# Patient Record
Sex: Female | Born: 1935 | Race: White | Hispanic: No | Smoking: Former smoker
Health system: Southern US, Community
[De-identification: ages and names within clinical notes are randomized; demographics above are authoritative.]

## PROBLEM LIST (undated history)

## (undated) DIAGNOSIS — I499 Cardiac arrhythmia, unspecified: Secondary | ICD-10-CM

## (undated) DIAGNOSIS — M545 Low back pain, unspecified: Secondary | ICD-10-CM

## (undated) DIAGNOSIS — Z860100 Personal history of colon polyps, unspecified: Secondary | ICD-10-CM

## (undated) DIAGNOSIS — M1990 Unspecified osteoarthritis, unspecified site: Secondary | ICD-10-CM

## (undated) DIAGNOSIS — E7800 Pure hypercholesterolemia, unspecified: Secondary | ICD-10-CM

## (undated) DIAGNOSIS — M25473 Effusion, unspecified ankle: Secondary | ICD-10-CM

## (undated) DIAGNOSIS — M542 Cervicalgia: Secondary | ICD-10-CM

## (undated) DIAGNOSIS — Z8719 Personal history of other diseases of the digestive system: Secondary | ICD-10-CM

## (undated) DIAGNOSIS — H269 Unspecified cataract: Secondary | ICD-10-CM

## (undated) HISTORY — DX: Pure hypercholesterolemia, unspecified: E78.00

## (undated) HISTORY — DX: Unspecified osteoarthritis, unspecified site: M19.90

## (undated) HISTORY — PX: COLONOSCOPY: SHX174

## (undated) HISTORY — DX: Personal history of colonic polyps: Z86.010

## (undated) HISTORY — PX: TUBAL LIGATION: SHX77

## (undated) HISTORY — DX: Cervicalgia: M54.2

## (undated) HISTORY — DX: Unspecified cataract: H26.9

## (undated) HISTORY — PX: OTHER SURGICAL HISTORY: SHX169

## (undated) HISTORY — PX: POLYPECTOMY: SHX149

## (undated) HISTORY — DX: Low back pain, unspecified: M54.50

## (undated) HISTORY — DX: Personal history of colon polyps, unspecified: Z86.0100

## (undated) HISTORY — DX: Low back pain: M54.5

---

## 1983-11-12 HISTORY — PX: LUMBAR LAMINECTOMY: SHX95

## 1995-07-13 HISTORY — PX: OTHER SURGICAL HISTORY: SHX169

## 1998-10-11 ENCOUNTER — Other Ambulatory Visit: Admission: RE | Admit: 1998-10-11 | Discharge: 1998-10-11 | Payer: Self-pay | Admitting: Obstetrics & Gynecology

## 1999-10-24 ENCOUNTER — Other Ambulatory Visit: Admission: RE | Admit: 1999-10-24 | Discharge: 1999-10-24 | Payer: Self-pay | Admitting: Obstetrics & Gynecology

## 2003-01-07 ENCOUNTER — Other Ambulatory Visit: Admission: RE | Admit: 2003-01-07 | Discharge: 2003-01-07 | Payer: Self-pay | Admitting: Obstetrics & Gynecology

## 2003-12-13 HISTORY — PX: ABDOMINAL HYSTERECTOMY: SHX81

## 2003-12-19 ENCOUNTER — Encounter (INDEPENDENT_AMBULATORY_CARE_PROVIDER_SITE_OTHER): Payer: Self-pay | Admitting: Specialist

## 2003-12-19 ENCOUNTER — Inpatient Hospital Stay (HOSPITAL_COMMUNITY): Admission: RE | Admit: 2003-12-19 | Discharge: 2003-12-22 | Payer: Self-pay | Admitting: Obstetrics and Gynecology

## 2004-11-21 ENCOUNTER — Ambulatory Visit: Payer: Self-pay | Admitting: Pulmonary Disease

## 2004-12-05 ENCOUNTER — Ambulatory Visit: Payer: Self-pay | Admitting: Pulmonary Disease

## 2005-03-12 ENCOUNTER — Other Ambulatory Visit: Admission: RE | Admit: 2005-03-12 | Discharge: 2005-03-12 | Payer: Self-pay | Admitting: Obstetrics & Gynecology

## 2005-11-25 ENCOUNTER — Ambulatory Visit: Payer: Self-pay | Admitting: Pulmonary Disease

## 2005-12-09 ENCOUNTER — Ambulatory Visit: Payer: Self-pay | Admitting: Pulmonary Disease

## 2006-05-13 ENCOUNTER — Encounter: Admission: RE | Admit: 2006-05-13 | Discharge: 2006-05-13 | Payer: Self-pay | Admitting: Obstetrics & Gynecology

## 2007-01-07 ENCOUNTER — Ambulatory Visit: Payer: Self-pay | Admitting: Pulmonary Disease

## 2007-01-07 LAB — CONVERTED CEMR LAB
ALT: 18 units/L (ref 0–40)
AST: 22 units/L (ref 0–37)
Albumin: 3.6 g/dL (ref 3.5–5.2)
Alkaline Phosphatase: 62 units/L (ref 39–117)
BUN: 13 mg/dL (ref 6–23)
Basophils Absolute: 0.1 10*3/uL (ref 0.0–0.1)
Basophils Relative: 1 % (ref 0.0–1.0)
Bilirubin Urine: NEGATIVE
Bilirubin, Direct: 0.1 mg/dL (ref 0.0–0.3)
CO2: 29 meq/L (ref 19–32)
Calcium: 9.1 mg/dL (ref 8.4–10.5)
Chloride: 112 meq/L (ref 96–112)
Cholesterol: 190 mg/dL (ref 0–200)
Creatinine, Ser: 0.7 mg/dL (ref 0.4–1.2)
Eosinophils Absolute: 0.1 10*3/uL (ref 0.0–0.6)
Eosinophils Relative: 1.7 % (ref 0.0–5.0)
GFR calc Af Amer: 106 mL/min
GFR calc non Af Amer: 87 mL/min
Glucose, Bld: 95 mg/dL (ref 70–99)
HCT: 38.1 % (ref 36.0–46.0)
HDL: 48.3 mg/dL (ref >39.0)
Hemoglobin, Urine: NEGATIVE
Hemoglobin: 13.3 g/dL (ref 12.0–15.0)
LDL Cholesterol: 126 mg/dL — ABNORMAL HIGH (ref 0–99)
Leukocytes, UA: NEGATIVE
Lymphocytes Relative: 23.3 % (ref 12.0–46.0)
MCHC: 34.9 g/dL (ref 30.0–36.0)
MCV: 93.5 fL (ref 78.0–100.0)
Monocytes Absolute: 0.7 10*3/uL (ref 0.2–0.7)
Monocytes Relative: 10.1 % (ref 3.0–11.0)
Neutro Abs: 4.5 10*3/uL (ref 1.4–7.7)
Neutrophils Relative %: 63.9 % (ref 43.0–77.0)
Nitrite: NEGATIVE
Platelets: 260 10*3/uL (ref 150–400)
Potassium: 4 meq/L (ref 3.5–5.1)
RBC: 4.07 M/uL (ref 3.87–5.11)
RDW: 12.4 % (ref 11.5–14.6)
Sodium: 145 meq/L (ref 135–145)
Specific Gravity, Urine: 1.015 (ref 1.000–1.03)
TSH: 2.4 microintl units/mL (ref 0.35–5.50)
Total Bilirubin: 1 mg/dL (ref 0.3–1.2)
Total CHOL/HDL Ratio: 3.9
Total Protein, Urine: NEGATIVE mg/dL
Total Protein: 6.6 g/dL (ref 6.0–8.3)
Triglycerides: 78 mg/dL (ref 0–149)
Urine Glucose: NEGATIVE mg/dL
Urobilinogen, UA: 0.2 (ref 0.0–1.0)
VLDL: 16 mg/dL (ref 0–40)
WBC: 7 10*3/uL (ref 4.5–10.5)
pH: 8 (ref 5.0–8.0)

## 2007-01-19 ENCOUNTER — Ambulatory Visit: Payer: Self-pay | Admitting: Gastroenterology

## 2007-02-02 ENCOUNTER — Ambulatory Visit: Payer: Self-pay | Admitting: Gastroenterology

## 2007-02-10 ENCOUNTER — Ambulatory Visit: Payer: Self-pay | Admitting: Gastroenterology

## 2007-02-10 LAB — CONVERTED CEMR LAB
Basophils Absolute: 0.1 10*3/uL (ref 0.0–0.1)
Basophils Relative: 0.8 % (ref 0.0–1.0)
Eosinophils Absolute: 0.2 10*3/uL (ref 0.0–0.6)
Eosinophils Relative: 1.9 % (ref 0.0–5.0)
HCT: 31.3 % — ABNORMAL LOW (ref 36.0–46.0)
Hemoglobin: 10.8 g/dL — ABNORMAL LOW (ref 12.0–15.0)
Lymphocytes Relative: 19.2 % (ref 12.0–46.0)
MCHC: 34.5 g/dL (ref 30.0–36.0)
MCV: 93.5 fL (ref 78.0–100.0)
Monocytes Absolute: 1 10*3/uL — ABNORMAL HIGH (ref 0.2–0.7)
Monocytes Relative: 12.5 % — ABNORMAL HIGH (ref 3.0–11.0)
Neutro Abs: 5.3 10*3/uL (ref 1.4–7.7)
Neutrophils Relative %: 65.6 % (ref 43.0–77.0)
Platelets: 304 10*3/uL (ref 150–400)
RBC: 3.35 M/uL — ABNORMAL LOW (ref 3.87–5.11)
RDW: 11.8 % (ref 11.5–14.6)
Sed Rate: 28 mm/hr — ABNORMAL HIGH (ref 0–25)
WBC: 8.2 10*3/uL (ref 4.5–10.5)

## 2007-05-18 ENCOUNTER — Encounter: Admission: RE | Admit: 2007-05-18 | Discharge: 2007-05-18 | Payer: Self-pay | Admitting: Obstetrics & Gynecology

## 2008-02-29 DIAGNOSIS — M810 Age-related osteoporosis without current pathological fracture: Secondary | ICD-10-CM | POA: Insufficient documentation

## 2008-02-29 DIAGNOSIS — D126 Benign neoplasm of colon, unspecified: Secondary | ICD-10-CM | POA: Insufficient documentation

## 2008-02-29 DIAGNOSIS — E7800 Pure hypercholesterolemia, unspecified: Secondary | ICD-10-CM | POA: Insufficient documentation

## 2008-03-01 ENCOUNTER — Ambulatory Visit: Payer: Self-pay | Admitting: Pulmonary Disease

## 2008-03-01 DIAGNOSIS — M545 Low back pain, unspecified: Secondary | ICD-10-CM | POA: Insufficient documentation

## 2008-03-01 DIAGNOSIS — M542 Cervicalgia: Secondary | ICD-10-CM | POA: Insufficient documentation

## 2008-03-09 ENCOUNTER — Telehealth: Payer: Self-pay | Admitting: Pulmonary Disease

## 2009-03-03 ENCOUNTER — Ambulatory Visit: Payer: Self-pay | Admitting: Pulmonary Disease

## 2009-03-05 LAB — CONVERTED CEMR LAB
ALT: 19 units/L (ref 0–35)
AST: 25 units/L (ref 0–37)
Albumin: 4.2 g/dL (ref 3.5–5.2)
Alkaline Phosphatase: 98 units/L (ref 39–117)
BUN: 16 mg/dL (ref 6–23)
Basophils Absolute: 0 10*3/uL (ref 0.0–0.1)
Basophils Relative: 0.5 % (ref 0.0–3.0)
Bilirubin, Direct: 0.1 mg/dL (ref 0.0–0.3)
CO2: 31 meq/L (ref 19–32)
Calcium: 11.1 mg/dL — ABNORMAL HIGH (ref 8.4–10.5)
Chloride: 110 meq/L (ref 96–112)
Cholesterol: 223 mg/dL — ABNORMAL HIGH (ref 0–200)
Creatinine, Ser: 0.8 mg/dL (ref 0.4–1.2)
Direct LDL: 137.6 mg/dL
Eosinophils Absolute: 0.1 10*3/uL (ref 0.0–0.7)
Eosinophils Relative: 2.1 % (ref 0.0–5.0)
GFR calc non Af Amer: 74.48 mL/min (ref >60)
Glucose, Bld: 97 mg/dL (ref 70–99)
HCT: 37.7 % (ref 36.0–46.0)
HDL: 57.2 mg/dL (ref >39.00)
Hemoglobin: 13.1 g/dL (ref 12.0–15.0)
Lymphocytes Relative: 26.9 % (ref 12.0–46.0)
Lymphs Abs: 1.6 10*3/uL (ref 0.7–4.0)
MCHC: 34.9 g/dL (ref 30.0–36.0)
MCV: 93.5 fL (ref 78.0–100.0)
Monocytes Absolute: 0.6 10*3/uL (ref 0.1–1.0)
Monocytes Relative: 10.1 % (ref 3.0–12.0)
Neutro Abs: 3.8 10*3/uL (ref 1.4–7.7)
Neutrophils Relative %: 60.4 % (ref 43.0–77.0)
Platelets: 228 10*3/uL (ref 150.0–400.0)
Potassium: 4.2 meq/L (ref 3.5–5.1)
RBC: 4.03 M/uL (ref 3.87–5.11)
RDW: 12.1 % (ref 11.5–14.6)
Sodium: 147 meq/L — ABNORMAL HIGH (ref 135–145)
TSH: 2.8 microintl units/mL (ref 0.35–5.50)
Total Bilirubin: 1 mg/dL (ref 0.3–1.2)
Total CHOL/HDL Ratio: 4
Total Protein: 7.4 g/dL (ref 6.0–8.3)
Triglycerides: 95 mg/dL (ref 0.0–149.0)
VLDL: 19 mg/dL (ref 0.0–40.0)
WBC: 6.1 10*3/uL (ref 4.5–10.5)

## 2009-07-19 ENCOUNTER — Telehealth: Payer: Self-pay | Admitting: Pulmonary Disease

## 2009-07-25 ENCOUNTER — Ambulatory Visit: Payer: Self-pay | Admitting: Pulmonary Disease

## 2009-07-25 LAB — CONVERTED CEMR LAB: Streptococcus, Group A Screen (Direct): NEGATIVE

## 2010-01-10 ENCOUNTER — Encounter: Payer: Self-pay | Admitting: Pulmonary Disease

## 2010-01-17 ENCOUNTER — Telehealth (INDEPENDENT_AMBULATORY_CARE_PROVIDER_SITE_OTHER): Payer: Self-pay | Admitting: *Deleted

## 2010-02-26 ENCOUNTER — Ambulatory Visit: Payer: Self-pay | Admitting: Pulmonary Disease

## 2010-03-15 ENCOUNTER — Ambulatory Visit: Payer: Self-pay | Admitting: Pulmonary Disease

## 2010-03-18 LAB — CONVERTED CEMR LAB
ALT: 16 units/L (ref 0–35)
AST: 22 units/L (ref 0–37)
Albumin: 3.9 g/dL (ref 3.5–5.2)
Alkaline Phosphatase: 65 units/L (ref 39–117)
BUN: 16 mg/dL (ref 6–23)
Basophils Absolute: 0.1 10*3/uL (ref 0.0–0.1)
Basophils Relative: 1.1 % (ref 0.0–3.0)
Bilirubin, Direct: 0 mg/dL (ref 0.0–0.3)
CO2: 32 meq/L (ref 19–32)
Calcium: 9.4 mg/dL (ref 8.4–10.5)
Chloride: 107 meq/L (ref 96–112)
Cholesterol: 216 mg/dL — ABNORMAL HIGH (ref 0–200)
Creatinine, Ser: 0.8 mg/dL (ref 0.4–1.2)
Direct LDL: 150 mg/dL
Eosinophils Absolute: 0.2 10*3/uL (ref 0.0–0.7)
Eosinophils Relative: 3.6 % (ref 0.0–5.0)
GFR calc non Af Amer: 75.36 mL/min (ref >60)
Glucose, Bld: 91 mg/dL (ref 70–99)
HCT: 39.8 % (ref 36.0–46.0)
HDL: 50.5 mg/dL (ref >39.00)
Hemoglobin: 13.6 g/dL (ref 12.0–15.0)
Lymphocytes Relative: 31.1 % (ref 12.0–46.0)
Lymphs Abs: 1.8 10*3/uL (ref 0.7–4.0)
MCHC: 34.1 g/dL (ref 30.0–36.0)
MCV: 95.5 fL (ref 78.0–100.0)
Monocytes Absolute: 0.8 10*3/uL (ref 0.1–1.0)
Monocytes Relative: 13.4 % — ABNORMAL HIGH (ref 3.0–12.0)
Neutro Abs: 3 10*3/uL (ref 1.4–7.7)
Neutrophils Relative %: 50.8 % (ref 43.0–77.0)
Phosphorus: 3.9 mg/dL (ref 2.3–4.6)
Platelets: 246 10*3/uL (ref 150.0–400.0)
Potassium: 4.6 meq/L (ref 3.5–5.1)
RBC: 4.17 M/uL (ref 3.87–5.11)
RDW: 13.6 % (ref 11.5–14.6)
Sodium: 143 meq/L (ref 135–145)
TSH: 4.63 microintl units/mL (ref 0.35–5.50)
Total Bilirubin: 0.7 mg/dL (ref 0.3–1.2)
Total CHOL/HDL Ratio: 4
Total Protein: 6.7 g/dL (ref 6.0–8.3)
Triglycerides: 76 mg/dL (ref 0.0–149.0)
VLDL: 15.2 mg/dL (ref 0.0–40.0)
WBC: 5.9 10*3/uL (ref 4.5–10.5)

## 2010-08-01 ENCOUNTER — Encounter: Admission: RE | Admit: 2010-08-01 | Discharge: 2010-08-01 | Payer: Self-pay | Admitting: Orthopedic Surgery

## 2010-09-12 ENCOUNTER — Encounter: Payer: Self-pay | Admitting: Pulmonary Disease

## 2010-12-02 ENCOUNTER — Encounter: Payer: Self-pay | Admitting: Obstetrics & Gynecology

## 2010-12-09 LAB — CONVERTED CEMR LAB
ALT: 16 units/L (ref 0–35)
AST: 24 units/L (ref 0–37)
Albumin: 4 g/dL (ref 3.5–5.2)
Alkaline Phosphatase: 114 units/L (ref 39–117)
BUN: 15 mg/dL (ref 6–23)
Basophils Absolute: 0 10*3/uL (ref 0.0–0.1)
Basophils Relative: 0.2 % (ref 0.0–1.0)
Bilirubin, Direct: 0.1 mg/dL (ref 0.0–0.3)
CO2: 32 meq/L (ref 19–32)
Calcium: 11.2 mg/dL — ABNORMAL HIGH (ref 8.4–10.5)
Chloride: 107 meq/L (ref 96–112)
Cholesterol: 234 mg/dL (ref 0–200)
Creatinine, Ser: 0.8 mg/dL (ref 0.4–1.2)
Direct LDL: 173.2 mg/dL
Eosinophils Absolute: 0.2 10*3/uL (ref 0.0–0.7)
Eosinophils Relative: 2.9 % (ref 0.0–5.0)
GFR calc Af Amer: 90 mL/min
GFR calc non Af Amer: 75 mL/min
Glucose, Bld: 104 mg/dL — ABNORMAL HIGH (ref 70–99)
HCT: 36.4 % (ref 36.0–46.0)
HDL: 51.9 mg/dL (ref >39.0)
Hemoglobin: 12.4 g/dL (ref 12.0–15.0)
Lymphocytes Relative: 25.7 % (ref 12.0–46.0)
MCHC: 34 g/dL (ref 30.0–36.0)
MCV: 94.4 fL (ref 78.0–100.0)
Monocytes Absolute: 0.7 10*3/uL (ref 0.1–1.0)
Monocytes Relative: 8.9 % (ref 3.0–12.0)
Neutro Abs: 5 10*3/uL (ref 1.4–7.7)
Neutrophils Relative %: 62.3 % (ref 43.0–77.0)
Platelets: 289 10*3/uL (ref 150–400)
Potassium: 4.2 meq/L (ref 3.5–5.1)
RBC: 3.86 M/uL — ABNORMAL LOW (ref 3.87–5.11)
RDW: 11.8 % (ref 11.5–14.6)
Sodium: 143 meq/L (ref 135–145)
TSH: 3.38 microintl units/mL (ref 0.35–5.50)
Total Bilirubin: 0.9 mg/dL (ref 0.3–1.2)
Total CHOL/HDL Ratio: 4.5
Total Protein: 7.1 g/dL (ref 6.0–8.3)
Triglycerides: 113 mg/dL (ref 0–149)
VLDL: 23 mg/dL (ref 0–40)
WBC: 7.9 10*3/uL (ref 4.5–10.5)

## 2010-12-11 NOTE — Letter (Signed)
Summary: [facility]  [facility]   Imported By: Sherian Rein 10/15/2010 08:25:50  _____________________________________________________________________  External Attachment:    Type:   Image     Comment:   External Document

## 2010-12-11 NOTE — Assessment & Plan Note (Signed)
Summary: cpx/fasting/apc   Chief Complaint:  Yearly ROV....  History of Present Illness: 77 y/o WF here for a follow up visit... she is doing well overall & reports that she started on FORTEO Aug08 per GYN= DrNeal...   Current Problem List:  PHYSICAL EXAMINATION (ICD-V70.0) - GYN= DrNeal for Pap, Mammograms, BMD- she reports he started FORTEO shots 8/08 and planning 42mo therapy... she had a Pneumovax in 2004, and gets yearly Flu shots...   HYPERCHOLESTEROLEMIA (ICD-272.0) - on diet alone, she has refused my rec's for Statin therapy in the past... states she's taking red yeast rice and wants to see how this works for her...  ~  FLP's over the last 67yrs: TChol 168-213, TG 72-113, HDL 44-53, LDL 107-145...  ~  last FLP 2/08 showed TChol 190, TG 78, HDL 48, LDL 126... she wanted to continue diet Rx.  COLONIC POLYPS (ICD-211.3) - prev exams showed divertics, polyps- last 1/04= hyperplastic.  ~  last colonoscopy 3/08 by DrPatterson was normal- no recurrent polyps... f/y planned 56yrs.  NECK PAIN (ICD-723.1) - new symptom= neck pain on & off, she would like neck XRay...  Hx of BACK PAIN, LUMBAR (ICD-724.2) - s/p lumbar laminectomy 1985 by DrGioffre...  OSTEOPOROSIS (ICD-733.00) - as noted, on FORTEO per DrNeal and he follows her BMD's... prev on Evista, still takes ca++/ vits... she was intol to Actonel Rx in the past...      Current Allergies (reviewed today): ! HYDROCODONE ! ACTONEL (RISEDRONATE SODIUM)  Past Medical History:        PHYSICAL EXAMINATION (ICD-V70.0)    HYPERCHOLESTEROLEMIA (ICD-272.0)    COLONIC POLYPS (ICD-211.3)    NECK PAIN (ICD-723.1)    Hx of BACK PAIN, LUMBAR (ICD-724.2)    OSTEOPOROSIS (ICD-733.00)      Past Surgical History:    S/P surgery for removal of rectal polyp by [facility] 9/96    S/P hysterectomy, BSO, A/P repair by [facility] 2/05    S/P lumbar laminectomy by DrGioffre in 1985    S/P cataract surgery     Review of Systems  The patient  denies anorexia, fever, weight loss, weight gain, vision loss, decreased hearing, hoarseness, chest pain, syncope, peripheral edema, prolonged cough, hemoptysis, abdominal pain, melena, hematochezia, severe indigestion/heartburn, hematuria, incontinence, muscle weakness, suspicious skin lesions, transient blindness, difficulty walking, depression, unusual weight change, abnormal bleeding, enlarged lymph nodes, and angioedema.     Vital Signs:  Patient Profile:   77 Years Old Female Weight:      108.50 pounds O2 Sat:      99 % O2 treatment:    Room Air Temp:     97.1 degrees F oral Pulse rate:   56 / minute BP sitting:   110 / 64  (left arm)  Vitals Entered By: Marijo File CMA (March 01, 2008 9:12 AM)                 Physical Exam  WD, WN, 77 y/o WF in NAD... GENERAL:  Alert & oriented; pleasant & cooperative... HEENT:  [place]/AT, EOM-wnl, PERRLA, EACs-clear, TMs-wnl, NOSE-clear, THROAT-clear & wnl. NECK:  Supple w/ full ROM; no JVD; normal carotid impulses w/o bruits; no thyromegaly or nodules palpated; no lymphadenopathy. CHEST:  Clear to P & A; without wheezes/ rales/ or rhonchi. HEART:  Regular Rhythm; without murmurs/ rubs/ or gallops. ABDOMEN:  Soft & nontender; normal bowel sounds; no organomegaly or masses detected. EXT: without deformities or arthritic changes; no varicose veins/ venous insuffic/ or edema. NEURO:  CN's intact; motor  testing normal; sensory testing normal; gait normal & balance OK. DERM:  No lesions noted; no rash etc...       Impression & Recommendations:  Problem # 1:  PHYSICAL EXAMINATION (ICD-V70.0) Good general health... no new concerns, and only c/o some neck discomfort- XRay CSpine per request... Orders: EKG w/ Interpretation (93000) T-2 View CXR, Same Day (71020.5TC) Venipuncture (10272) TLB-Lipid Panel (80061-LIPID) TLB-BMP (Basic Metabolic Panel-BMET) (80048-METABOL) TLB-CBC Platelet - w/Differential (85025-CBCD) TLB-Hepatic/Liver  Function Pnl (80076-HEPATIC) TLB-TSH (Thyroid Stimulating Hormone) (84443-TSH)   Problem # 2:  HYPERCHOLESTEROLEMIA (ICD-272.0) Assessment: Unchanged She has had mildly elevated numbers in the past- doesn't want med Rx... on diet alone... f/u FLP.  Problem # 3:  COLONIC POLYPS (ICD-211.3) Assessment: Unchanged She is up-to-date w/ colonsocopy last year...  Problem # 4:  OSTEOPOROSIS (ICD-733.00) Assessment: Unchanged She is expertly managed by DrNeal and currently taking Forteo, ca++, vits... Her updated medication list for this problem includes:    Forteo 600 Mcg/2.59ml Soln (Teriparatide (recombinant))    Caltrate 600+d Plus 600-400 Mg-unit Chew (Calcium carbonate-vit d-min) .Marland Kitchen... Take 2 tablets by mouth once daily    Vitamin D 400 Unit Tabs (Cholecalciferol) .Marland Kitchen... Take 1 tablet by mouth once a day  Problem # 5:  NECK PAIN (ICD-723.1) Assessment: New We will check CSpine films... Her updated medication list for this problem includes:    Adult Aspirin Low Strength 81 Mg Tbdp (Aspirin) .Marland Kitchen... Take 1 tablet by mouth once a day  Complete Medication List: 1)  Adult Aspirin Low Strength 81 Mg Tbdp (Aspirin) .... Take 1 tablet by mouth once a day 2)  Forteo 600 Mcg/2.10ml Soln (Teriparatide (recombinant)) 3)  Caltrate 600+d Plus 600-400 Mg-unit Chew (Calcium carbonate-vit d-min) .... Take 2 tablets by mouth once daily 4)  Vitamin D 400 Unit Tabs (Cholecalciferol) .... Take 1 tablet by mouth once a day 5)  Multivitamins Tabs (Multiple vitamin) .... Take 1 tablet by mouth once a day   Patient Instructions: 1)  Today we updated your med list--- see below. 2)  We also did a follow up CXR & CSpine films, plus your FASTING blood work... please call the "phone tree" in a few days for your lab results.Marland KitchenMarland Kitchen 3)  Be careful w/ your exercise program and protect your neck... 4)  Call for any problems...    ]

## 2010-12-11 NOTE — Letter (Signed)
Summary: [facility]   Imported By: Lester [place] 01/22/2010 09:22:24  _____________________________________________________________________  External Attachment:    Type:   Image     Comment:   External Document

## 2010-12-11 NOTE — Assessment & Plan Note (Signed)
Summary: cpx///kp   CC:  cpx/fasting today.  History of Present Illness: 77 y/o WF here for a follow up visit... she is doing well overall without new complaints or concerns... she has been under increased stress w/ her husb's bladder cancer (Rx by DrOttelin), and her grandson's recent Dx w/ an anaplastic ependymoma (s/p neurosurg & XRT)... she doesn't like taking meds and refuses statin therapy for her cholesterol... she is also followed by [facility] for GYN and she reports that he has checked her BMD (she remains on Forteo under his direction), and Vit D level (on OTC Vit D supplement)...     Current Problem List:  PHYSICAL EXAMINATION (ICD-V70.0) - on ASA 81mg /d... GYN= DrNeal for Pap, Mammograms, BMD- she reports he started FORTEO shots 8/08 and planning 44mo therapy... she had a Pneumovax in 2004, and gets yearly Flu shots...   HYPERCHOLESTEROLEMIA (ICD-272.0) - on diet alone, she has refused my rec's for Statin therapy in the past... states she's taking red yeast rice and wants to see how this works for her...  ~  FLP's over the last 28yrs: TChol 168-213, TG 72-113, HDL 44-53, LDL 107-145...  ~  FLP 2/08 showed TChol 190, TG 78, HDL 48, LDL 126... she wanted to continue diet Rx.  ~  FLP 4/09 showed TChol 234, TG 113, HDL 52, LDL 173... pt refused Simva rx.  ~  FLP 4/10 showed TChol 223, TG 95, HDL 57, LDL 138... she wants to continue diet alone.  COLONIC POLYPS (ICD-211.3) - prev exams showed divertics, polyps- last 1/04= hyperplastic.  ~  last colonoscopy 3/08 by DrPatterson was normal- no recurrent polyps... f/y planned 50yrs.  NECK PAIN (ICD-723.1) - c/o neck pain on & off... CSpine films 4/09 were WNL.Marland KitchenMarland Kitchen  Hx of BACK PAIN, LUMBAR (ICD-724.2) - s/p lumbar laminectomy 1985 by DrGioffre...  OSTEOPOROSIS (ICD-733.00) - as noted, on FORTEO per DrNeal and he follows her BMD's... prev on Evista, still takes ca++/ vits... she was intol to Actonel Rx in the past...   Allergies: 1)  !  Hydrocodone 2)  ! Actonel (Risedronate Sodium)  Comments:  Nurse/Medical Assistant: The patient's medications and allergies were reviewed with the patient and were updated in the Medication and Allergy Lists.  Past History:  Past Medical History:    HYPERCHOLESTEROLEMIA (ICD-272.0)    COLONIC POLYPS (ICD-211.3)    NECK PAIN (ICD-723.1)    Hx of BACK PAIN, LUMBAR (ICD-724.2)    OSTEOPOROSIS (ICD-733.00)  Past Surgical History:    S/P lumbar laminectomy by DrGioffre in 1985    S/P surgery for removal of rectal polyp by DrTDavis 9/96    S/P hysterectomy, BSO, A/P repair by [facility] 2/05    S/P cataract surgery  Family History:    Reviewed history and no changes required:  Social History:    Reviewed history and no changes required:  Review of Systems      See HPI       The patient complains of back pain.  The patient denies fever, chills, sweats, anorexia, fatigue, weakness, malaise, weight loss, sleep disorder, blurring, diplopia, eye irritation, eye discharge, vision loss, eye pain, photophobia, earache, ear discharge, tinnitus, decreased hearing, nasal congestion, nosebleeds, sore throat, hoarseness, chest pain, palpitations, syncope, dyspnea on exertion, orthopnea, PND, peripheral edema, cough, dyspnea at rest, excessive sputum, hemoptysis, wheezing, pleurisy, nausea, vomiting, diarrhea, constipation, change in bowel habits, abdominal pain, melena, hematochezia, jaundice, gas/bloating, indigestion/heartburn, dysphagia, odynophagia, dysuria, hematuria, urinary frequency, urinary hesitancy, nocturia, incontinence, joint pain, joint swelling, muscle  cramps, muscle weakness, stiffness, arthritis, sciatica, restless legs, leg pain at night, leg pain with exertion, rash, itching, dryness, suspicious lesions, paralysis, paresthesias, seizures, tremors, vertigo, transient blindness, frequent falls, frequent headaches, difficulty walking, depression, anxiety, memory loss, confusion, cold  intolerance, heat intolerance, polydipsia, polyphagia, polyuria, unusual weight change, abnormal bruising, bleeding, enlarged lymph nodes, urticaria, allergic rash, hay fever, and recurrent infections.    Vital Signs:  Patient profile:   76 year old female Height:      60 inches Weight:      107.13 pounds BMI:     21.00 O2 Sat:      96 % Temp:     96.5 degrees F oral Pulse rate:   61 / minute BP sitting:   132 / 60  (right arm) Cuff size:   regular  Vitals Entered By: Marijo File CMA (March 03, 2009 10:01 AM)  O2 Sat at Rest %:  96 O2 Flow:  room air CC: cpx/fasting today Is Patient Diabetic? No Pain Assessment Patient in pain? no      Comments pt knows her meds   Physical Exam  Additional Exam:  WD, WN, 77 y/o WF in NAD... GENERAL:  Alert & oriented; pleasant & cooperative... HEENT:  [place]/AT, EOM-wnl, PERRLA, EACs-clear, TMs-wnl, NOSE-clear, THROAT-clear & wnl. NECK:  Supple w/ fairROM; no JVD; normal carotid impulses w/o bruits; no thyromegaly or nodules palpated; no lymphadenopathy. CHEST:  Clear to P & A; without wheezes/ rales/ or rhonchi. HEART:  Regular Rhythm; without murmurs/ rubs/ or gallops. ABDOMEN:  Soft & nontender; normal bowel sounds; no organomegaly or masses detected. EXT: without deformities or arthritic changes; no varicose veins/ venous insuffic/ or edema. NEURO:  CN's intact; motor testing normal; sensory testing normal; gait normal & balance OK. DERM:  No lesions noted; she is way too tanned & advised to: 1- GET OUT OF THE SUN, 2- eval by Derm for any early lesions.     Impression & Recommendations:  Problem # 1:  HYPERCHOLESTEROLEMIA (ICD-272.0) FLP is not at goal but she is committed to diet Rx and refuses Statin therapy... Orders: Venipuncture (47425) TLB-Lipid Panel (80061-LIPID) TLB-BMP (Basic Metabolic Panel-BMET) (80048-METABOL) TLB-CBC Platelet - w/Differential (85025-CBCD) TLB-Hepatic/Liver Function Pnl (80076-HEPATIC) TLB-TSH  (Thyroid Stimulating Hormone) (84443-TSH)  Problem # 2:  COLONIC POLYPS (ICD-211.3) She is up to date on colonoscopy etc per GI...  Problem # 3:  Hx of BACK PAIN, LUMBAR (ICD-724.2) She has some intermittent neck pain & back pain...  Her updated medication list for this problem includes:    Adult Aspirin Low Strength 81 Mg Tbdp (Aspirin) .Marland Kitchen... Take 1 tablet by mouth once a day  Problem # 4:  OSTEOPOROSIS (ICD-733.00) Followed by Latanya Presser... Her updated medication list for this problem includes:    Forteo 600 Mcg/2.30ml Soln (Teriparatide (recombinant))  Problem # 5:  PHYSICAL EXAMINATION (ICD-V70.0) She has been under increased stress but doesn't want anxiolytic meds...  Complete Medication List: 1)  Adult Aspirin Low Strength 81 Mg Tbdp (Aspirin) .... Take 1 tablet by mouth once a day 2)  Forteo 600 Mcg/2.10ml Soln (Teriparatide (recombinant)) 3)  Caltrate 600+d Plus 600-400 Mg-unit Chew (Calcium carbonate-vit d-min) .... Take 2 tablets by mouth once daily 4)  Vitamin D 400 Unit Tabs (Cholecalciferol) .... Take 1 tablet by mouth once a day 5)  Multivitamins Tabs (Multiple vitamin) .... Take 1 tablet by mouth once a day  Other Orders: EKG w/ Interpretation (93000) T-2 View CXR, Same Day (71020.5TC)  Patient Instructions: 1)  Today  we updated your med list- see below.... 2)  Ema- you look great... keep up the good work (and get out of the sun). 3)  Today we did your follow up CXR & fasting blood work... please call the "phone tree" in a few days for your lab results.Marland KitchenMarland Kitchen 4)  Call for any questions.Marland KitchenMarland Kitchen

## 2010-12-11 NOTE — Assessment & Plan Note (Signed)
Summary: sore throat, white spots on tonsils/apc   CC:  sore throat and white spots on tonsils  and tension/soreness in the back of the neckx1week.  states went to UC last Wed but was told it was food particles.  .  History of Present Illness: 77 yo female with known history of Hyperlipidemia, Osteoporosis  07/25/09--Presents for an acute office visit. Complains of sore throat, white spots on tonsils  and tension/soreness in the back of the neckx1week.  states went to UC last Wed but was told it was food particles. Complians of drainage, mild soreness with drinking. Denies chest pain, dyspnea, orthopnea, hemoptysis, fever, n/v/d, edema, headache.     Medications Prior to Update: 1)  Adult Aspirin Low Strength 81 Mg  Tbdp (Aspirin) .... Take 1 Tablet By Mouth Once A Day 2)  Forteo 600 Mcg/2.66ml  Soln (Teriparatide (Recombinant)) 3)  Caltrate 600+d Plus 600-400 Mg-Unit  Chew (Calcium Carbonate-Vit D-Min) .... Take 2 Tablets By Mouth Once Daily 4)  Vitamin D 400 Unit  Tabs (Cholecalciferol) .... Take 1 Tablet By Mouth Once A Day 5)  Multivitamins   Tabs (Multiple Vitamin) .... Take 1 Tablet By Mouth Once A Day  Allergies: 1)  ! Hydrocodone 2)  ! Actonel (Risedronate Sodium)  Past History:  Past Surgical History: Last updated: 03/03/2009 S/P lumbar laminectomy by DrGioffre in 1985 S/P surgery for removal of rectal polyp by DrTDavis 9/96 S/P hysterectomy, BSO, A/P repair by DrNeal 2/05 S/P cataract surgery  Social History: Last updated: 07/25/2009 Married 7 kids remote smoking x 1 yr- teens wine w/ dinner.   Risk Factors: Smoking Status: quit (02/29/2008)  Past Medical History: PHYSICAL EXAMINATION (ICD-V70.0) - on ASA 81mg /d... GYN= DrNeal for Pap, Mammograms, BMD- she reports he started FORTEO shots 8/08 and planning 23mo therapy... she had a Pneumovax in 2004, and gets yearly Flu shots...   HYPERCHOLESTEROLEMIA (ICD-272.0) - on diet alone, she has refused my rec's for Statin  therapy in the past... states she's taking red yeast rice and wants to see how this works for her...  ~  FLP's over the last 60yrs: TChol 168-213, TG 72-113, HDL 44-53, LDL 107-145...  ~  FLP 2/08 showed TChol 190, TG 78, HDL 48, LDL 126... she wanted to continue diet Rx.  ~  FLP 4/09 showed TChol 234, TG 113, HDL 52, LDL 173... pt refused Simva rx.  ~  FLP 4/10 showed TChol 223, TG 95, HDL 57, LDL 138... she wants to continue diet alone.  COLONIC POLYPS (ICD-211.3) - prev exams showed divertics, polyps- last 1/04= hyperplastic.  ~  last colonoscopy 3/08 by DrPatterson was normal- no recurrent polyps... f/y planned 77yrs.  NECK PAIN (ICD-723.1) - c/o neck pain on & off... CSpine films 4/09 were WNL.Marland KitchenMarland Kitchen  Hx of BACK PAIN, LUMBAR (ICD-724.2) - s/p lumbar laminectomy 1985 by DrGioffre...  OSTEOPOROSIS (ICD-733.00) - as noted, on FORTEO per DrNeal and he follows her BMD's... prev on Evista, still takes ca++/ vits... she was intol to Actonel Rx in the past...    Social History: Married 7 kids remote smoking x 1 yr- teens wine w/ dinner.   Review of Systems      See HPI  Vital Signs:  Patient profile:   77 year old female Height:      60 inches O2 Sat:      99 % on Room air Temp:     97.1 degrees F oral Pulse rate:   95 / minute BP sitting:   134 /  70  (left arm) Cuff size:   regular  Vitals Entered By: Boone Master CNA (July 25, 2009 4:26 PM)  O2 Flow:  Room air  Physical Exam  Additional Exam:  WD, WN, 77 y/o WF in NAD... GENERAL:  Alert & oriented; pleasant & cooperative... HEENT:  [place]/AT, EOM-wnl, PERRLA, EACs-clear, TMs-wnl, NOSE-clear, THROAT-clear drainage, no lesion.  NECK:  Supple w/ fairROM; no JVD; normal carotid impulses w/o bruits; no thyromegaly or nodules palpated; no lymphadenopathy. CHEST:  Clear to P & A; without wheezes/ rales/ or rhonchi. HEART:  Regular Rhythm; without murmurs/ rubs/ or gallops. ABDOMEN:  Soft & nontender; normal bowel sounds; no  organomegaly or masses detected. EXT: without deformities or arthritic changes; no varicose veins/ venous insuffic/ or edema.     Impression & Recommendations:  Problem # 1:  UPPER RESPIRATORY INFECTION (ICD-465.9) strep neg.  Zpack take as directed.  Salt water gargles as needed  Claritin 10mg  at bedtime for 5 days Increase fluids, rest, tylneol as needed  Please contact office for sooner follow up if symptoms do not improve or worsen  Her updated medication list for this problem includes:    Adult Aspirin Low Strength 81 Mg Tbdp (Aspirin) .Marland Kitchen... Take 1 tablet by mouth once a day  Orders: Est. Patient Level III (16109)  Medications Added to Medication List This Visit: 1)  Vitamin D 1000 Unit Tabs (Cholecalciferol) .... Take 1 tablet by mouth once a day 2)  Zithromax Z-pak 250 Mg Tabs (Azithromycin) .... Take as directed.  Complete Medication List: 1)  Adult Aspirin Low Strength 81 Mg Tbdp (Aspirin) .... Take 1 tablet by mouth once a day 2)  Caltrate 600+d Plus 600-400 Mg-unit Chew (Calcium carbonate-vit d-min) .... Take 2 tablets by mouth once daily 3)  Vitamin D 1000 Unit Tabs (Cholecalciferol) .... Take 1 tablet by mouth once a day 4)  Multivitamins Tabs (Multiple vitamin) .... Take 1 tablet by mouth once a day 5)  Zithromax Z-pak 250 Mg Tabs (Azithromycin) .... Take as directed.  Other Orders: TLB-Beta Strep Screen (87880-STRSCR)  Patient Instructions: 1)  Zpack take as directed.  2)  Salt water gargles as needed  3)  Claritin 10mg  at bedtime for 5 days 4)  Increase fluids, rest, tylneol as needed  5)  Please contact office for sooner follow up if symptoms do not improve or worsen  Prescriptions: ZITHROMAX Z-PAK 250 MG TABS (AZITHROMYCIN) take as directed.  #1 x 0   Entered and Authorized by:   Rubye Oaks NP   Signed by:   Rubye Oaks NP on 07/25/2009   Method used:   Electronically to        [facility] [phone]* (retail)       [address]        [place], [place]  [address]       Ph: [phone] or [phone]       Fax: [phone]   RxID:   [id number]

## 2010-12-11 NOTE — Progress Notes (Signed)
Summary: appt  Phone Note Call from Patient Call back at cell# [phone]   Caller: Patient Reason for Call: Talk to Nurse Summary of Call: Dr. Celene Skeen sent a report to Dr. Kriste Basque and told pt she needed to schedule appt w/ him.  Do we have report? Initial call taken by: Eugene Gavia,  January 17, 2010 9:42 AM  Follow-up for Phone Call        Has SN received any notes from Dr. Celene Skeen regarding this patient and vascular issues with her feet? Please advise.Michel Bickers CMA  January 17, 2010 10:02 AM  Additional Follow-up for Phone Call Additional follow up Details #1::        per SN---he did receive the letter from Dr. Celene Skeen----????[facility] ???  this is treatable with medication if the symptoms are severe and other causes are ruled out.   please schedule ov to discuss this----lmomtcb for appt date Randell Loop [facility]  January 17, 2010 5:10 PM   pt returned call to [facility].  Please call her back on her cell phone today. Additional Follow-up by: Eugene Gavia,  January 18, 2010 8:09 AM    Additional Follow-up for Phone Call Additional follow up Details #2::    called and spoke with pt-- she is aware of SN recs and will come in for appt on 4-27 at 3pm---will call pt if we have any cancellations per pts request. Randell Loop CMA  January 19, 2010 9:55 AM

## 2010-12-11 NOTE — Progress Notes (Signed)
Summary: NEED LAB RESULTS  Phone Note Call from Patient   Caller: Patient Call For: Desiraye Rolfson Summary of Call: NEED LAB RESULTS  Initial call taken by: Rickard Patience,  March 09, 2008 2:49 PM  Follow-up for Phone Call        results are in EMR unsigned; please have SN or TP review and we will call pt with results. Thanks. Reynaldo Minium CMA  March 09, 2008 2:51 PM   Additional Follow-up for Phone Call Additional follow up Details #1::        called pt about her lab and xray results...per SN---he rec that she start on simvastatin 40mg  due to her chol. test.  pt requests that a copy of her labs be mailed to her.  she stated that she is taking the red yeast rice and would like to wait at least another month and have her chol rechecked.  she may decide to start on the simvastatin at that time if the numbers are no better. Additional Follow-up by: Marijo File CMA,  March 09, 2008 5:02 PM

## 2010-12-11 NOTE — Assessment & Plan Note (Signed)
Summary: f/u to discuss papers from Dr. Rayfield Citizen   CC:  Yearly ROV & review....  History of Present Illness: 77 y/o WF here for a follow up visit...    ~  Apr10:  she is doing well overall without new complaints or concerns... she has been under increased stress w/ her husb's bladder cancer (Rx by DrOttelin), and her grandson's recent Dx w/ an anaplastic ependymoma (s/p neurosurg & XRT)... she doesn't like taking meds and refuses statin therapy for her cholesterol... she is also followed by [facility] for GYN and she reports that he has checked her BMD (she remains on Forteo under his direction), and Vit D level (on OTC Vit D supplement)...    ~  February 26, 2010:  she is concerned because podiatrist DrTuckman dx "vasospasm" ?Raynaud's in her second toe bilaterally- she notes that it gets cold, turns purple, & "peels" in the winter... there is no pain, no involvement of other toes or fingers, and periph pulses are totally WNL.Marland Kitchen. we discussed this in detail & she is not inclined to try new meds (eg- Procardia)... sshe will f/u w/ Ortho (new foot doc at [facility] office) for their opinion...  otherw she is doing well- notes some neck pain but hasn't tried any meds & doesn't want pain Rx, she will also have this looked at by Ortho DrLucey... she will return for FASTING labs in several week.    Current Problem List:  PHYSICAL EXAMINATION (ICD-V70.0) - on ASA 81mg /d... GYN= DrNeal for Pap, Mammograms, BMD- she reports he started FORTEO shots 8/08 and stopped after 20yrs therapy... she notes that BMD is improved- now on Caltrate alone... she had a Pneumovax in 2004, and gets yearly Flu shots... "I'm not a pill taker" she says...  HYPERCHOLESTEROLEMIA (ICD-272.0) - on diet alone, she has refused my rec's for Statin therapy in the past... states she's taking red yeast rice, Krill oil, and OTC Niacin...  ~  FLP's over the last 77yrs: TChol 168-213, TG 72-113, HDL 44-53, LDL 107-145...  ~  FLP 2/08 showed  TChol 190, TG 78, HDL 48, LDL 126... she wanted to continue diet Rx.  ~  FLP 4/09 showed TChol 234, TG 113, HDL 52, LDL 173... pt refused Simva rx.  ~  FLP 4/10 showed TChol 223, TG 95, HDL 57, LDL 138... she wants to continue diet alone.  ~  FLP 5/11 showed =   COLONIC POLYPS (ICD-211.3) - prev exams showed divertics, polyps- last 1/04= hyperplastic.  ~  last colonoscopy 3/08 by DrPatterson was normal- no recurrent polyps... f/y planned 63yrs.  NECK PAIN (ICD-723.1) - c/o neck pain on & off... CSpine films 4/09 were WNL.Marland Kitchen. she refuses pain meds and will f/u w/ her Orthopedist DrLucey...  Hx of BACK PAIN, LUMBAR (ICD-724.2) - s/p lumbar laminectomy 1985 by DrGioffre...  OSTEOPOROSIS (ICD-733.00) - as noted, took FORTEO x23yrs per DrNeal and he follows her BMD's... prev on Evista, still takes ca++/ vits... she was intol to Actonel Rx in the past...    Allergies: 1)  ! Hydrocodone 2)  ! Actonel (Risedronate Sodium)  Comments:  Nurse/Medical Assistant: The patient's medications and allergies were reviewed with the patient and were updated in the Medication and Allergy Lists.  Past History:  Past Medical History:  HYPERCHOLESTEROLEMIA (ICD-272.0) COLONIC POLYPS (ICD-211.3) NECK PAIN (ICD-723.1) Hx of BACK PAIN, LUMBAR (ICD-724.2) OSTEOPOROSIS (ICD-733.00)  Past Surgical History: S/P lumbar laminectomy by DrGioffre in 1985 S/P surgery for removal of rectal polyp by [facility] 9/96 S/P hysterectomy,  BSO, A/P repair by [facility] 2/05 S/P cataract surgery  Family History: Reviewed history and no changes required.  Social History: Reviewed history from 07/25/2009 and no changes required. Married 7 kids remote smoking x 1 yr- teens wine w/ dinner.   Review of Systems      See HPI  The patient denies anorexia, fever, weight loss, weight gain, vision loss, decreased hearing, hoarseness, chest pain, syncope, dyspnea on exertion, peripheral edema, prolonged cough, headaches,  hemoptysis, abdominal pain, melena, hematochezia, severe indigestion/heartburn, hematuria, incontinence, muscle weakness, suspicious skin lesions, transient blindness, difficulty walking, depression, unusual weight change, abnormal bleeding, enlarged lymph nodes, and angioedema.    Vital Signs:  Patient profile:   77 year old female Height:      60 inches Weight:      112.25 pounds BMI:     22.00 O2 Sat:      98 % on Room air Temp:     97.2 degrees F oral Pulse rate:   84 / minute BP sitting:   124 / 66  (right arm) Cuff size:   regular  Vitals Entered By: Randell Loop CMA (February 26, 2010 1:46 PM)  O2 Sat at Rest %:  98 O2 Flow:  Room air CC: Yearly ROV & review... Is Patient Diabetic? No Pain Assessment Patient in pain? yes      Onset of pain  neck pain Comments meds updated today   Physical Exam  Additional Exam:  WD, WN, 77 y/o WF in NAD... GENERAL:  Alert & oriented; pleasant & cooperative... HEENT:  [place]/AT, EOM-wnl, PERRLA, EACs-clear, TMs-wnl, NOSE-clear, THROAT-clear drainage, no lesion.  NECK:  Supple w/ fairROM; no JVD; normal carotid impulses w/o bruits; no thyromegaly or nodules palpated; no lymphadenopathy. CHEST:  Clear to P & A; without wheezes/ rales/ or rhonchi. HEART:  Regular Rhythm; without murmurs/ rubs/ or gallops. ABDOMEN:  Soft & nontender; normal bowel sounds; no organomegaly or masses detected. EXT: without deformities or arthritic changes; no varicose veins/ venous insuffic/ or edema. Exam of toes is neg- no sores, lesions, etc... distal pulses all normal. NEURO:  no focal neuro deficits... DERM:  no lesions seen...    Impression & Recommendations:  Problem # 1:  INTERMITTENT 2nd TOE PROBLEM>>> Exam is neg-  we discussed poss local vasoregulatory phenomenon & consider Procardia Rx for Prn use... she doesn't want new meds and will seek another opinion for Foot Orthopedist at [facility] office...  Problem # 2:  HYPERCHOLESTEROLEMIA  (ICD-272.0) She will ret for FLP soon on the RYR, Niacin, Krill Oil, & diet... she refuses statin Rx. Her updated medication list for this problem includes:    Niacin 500 Mg Tabs (Niacin) .Marland Kitchen... Take 1 tablet by mouth once a day  Problem # 3:  COLONIC POLYPS (ICD-211.3) Followed by DrPatterson- f/u colon due 2013...  Problem # 4:  NECK PAIN (ICD-723.1) She does not want to take meds for neck pain... she will f/u w/ Ortho for further eval when she is ready... Her updated medication list for this problem includes:    Adult Aspirin Low Strength 81 Mg Tbdp (Aspirin) .Marland Kitchen... Take 1 tablet by mouth once a day  Problem # 5:  OSTEOPOROSIS (ICD-733.00) Took FORTEO from drNeal x32yrs... f/u BMD from DrNeal reprted by pt to be improved and DrNeal has her on Caltrate alone at present...  Complete Medication List: 1)  Adult Aspirin Low Strength 81 Mg Tbdp (Aspirin) .... Take 1 tablet by mouth once a day 2)  Caltrate 600+d Plus  600-400 Mg-unit Chew (Calcium carbonate-vit d-min) .... Take 2 tablets by mouth once daily 3)  Vitamin D 1000 Unit Tabs (Cholecalciferol) .... Take 1 tablet by mouth once a day 4)  Multivitamins Tabs (Multiple vitamin) .... Take 1 tablet by mouth once a day 5)  Red Yeast Rice Extract 600 Mg Caps (Red yeast rice extract) .... Take 1 tablet by mouth once a day 6)  Fish Oil 300 Mg Caps (Omega-3 fatty acids) .... Take 1 tablet by mouth once a day 7)  Niacin 500 Mg Tabs (Niacin) .... Take 1 tablet by mouth once a day  Patient Instructions: 1)  Today we updated your med list- see below.... 2)  Please return to our lab during the 1st week of May for your FASTING blood work... then please call the "phone tree" in a few days for your lab results.Marland KitchenMarland Kitchen  3)  Let me know if you need a referral from [place] to see the Ortho doctors.Marland KitchenMarland Kitchen

## 2010-12-11 NOTE — Progress Notes (Signed)
Summary: ? strep ?  Phone Note Call from Patient Call back at Home Phone [phone] Call back at [phone]   Caller: Patient Call For: Omkar Stratmann Reason for Call: Talk to Nurse Summary of Call: pt has white spots on tonsils.  Wants to come in for strep culture. Can she? Initial call taken by: Eugene Gavia,  July 19, 2009 10:19 AM  Follow-up for Phone Call        Pt c/o dry throat with white spots x 1 day, has tried salt water rinses w/o any relief and is requesting to come in w/o OV and have strep test done. Is this ok? Please advise. Follow-up by: Zackery Barefoot CMA,  July 19, 2009 11:05 AM  Additional Follow-up for Phone Call Additional follow up Details #1::        pt can come in to see TP tomorrow if she wants to ---SN has no appt aval.  thanks Marijo File CMA  July 19, 2009 11:14 AM   Advised pt of above and pt declined same stating she will just go to an Urgent Care and be done with it since she believes they have same day test results, and her schedule is busy as well. Zackery Barefoot CMA  July 19, 2009 11:20 AM

## 2010-12-11 NOTE — Letter (Signed)
Summary: [facility]  [facility]   Imported By: Lester Woodmore 01/22/2010 09:24:13  _____________________________________________________________________  External Attachment:    Type:   Image     Comment:   External Document

## 2011-03-29 NOTE — Assessment & Plan Note (Signed)
[facility]                         GASTROENTEROLOGY OFFICE NOTE   NAME:[name], [name]                    MRN:          [id number]  DATE:02/10/2007                            DOB:          Sep 11, 1933    Mrs. Petko is a 77 year old white female who has recurrent colon  polyps, and had followup colonoscopy on February 02, 2007.  After her  procedure, she had left upper quadrant discomfort which has persisted  but is slowly getting better.  She describes it as a constant, dull  pain, made worse by coughing and taking a deep breath and movement.  She  has had no sputum production, other cardiopulmonary symptoms or  genitourinary symptoms.  Denies fever, chills, nausea or vomiting,  melena or hematochezia.  She did have a hysterectomy some 3 years ago.  She, otherwise, is in good health and is on no medications except for a  variety of multivitamins.   PHYSICAL EXAMINATION:  Shows her to be an attractive, healthy-appearing  white female, appearing younger than her stated age.  She weighs 111 pounds, blood pressure is 110/60, and pulse is 84 and  regular.  I could not appreciate stigmata of chronic liver disease.  Her chest is clear.  There are no murmurs, gallops, or rubs noted.  She is in a regular  rhythm.  I cannot appreciate hepatosplenomegaly, abdominal masses, or tenderness.  There is no CVA tenderness.  Bowel sounds were normal.  Rectal exam was deferred.   ASSESSMENT:  Mrs. Karnik very well could have some pain related to her  colonoscopy, related possibly to some abrasion of adhesions in her  abdomen related to her previous hysterectomy.  It is certainly unlikely  that she has had low grade perforation on reviewing her colonoscopy  report.  She certainly does not appear ill at this time.   RECOMMENDATIONS:  I decided to check a CBC and sed rate and proceed  accordingly.  Since she is getting better, I would like to get a CT  scan,  unless this laboratory data suggests inflammatory action.  She  seems content with this decision, and we will follow her clinically.     Vania Rea. Jarold Motto, MD, Caleen Essex, FAGA  Electronically Signed    DRP/MedQ  DD: 02/10/2007  DT: 02/10/2007  Job #: [id number]

## 2011-03-29 NOTE — Discharge Summary (Signed)
[name], [name]                       ACCOUNT NO.:  [id number]   MEDICAL RECORD NO.:  [id number]                   PATIENT TYPE:  INP   LOCATION:  9317                                 FACILITY:  [facility]   PHYSICIAN:  Freddy Finner, M.D.                DATE OF BIRTH:  08/13/1933   DATE OF ADMISSION:  12/19/2003  DATE OF DISCHARGE:  12/22/2003                                 DISCHARGE SUMMARY   DISCHARGE DIAGNOSES:  1. Second degree cystocele.  2. First degree rectocele.  3. Marked uterine descensus.  4. Symptoms of pelvic relaxation.  5. Histologic diagnosis of uterine adenomyosis.  6. Simple endometrial hyperplasia with associated benign endometrial polyp.   OPERATIVE PROCEDURE:  Total vaginal hysterectomy, bilateral salpingo-  oophorectomy, anterior and posterior colporrhaphy, with sacrospinous  ligament suspension of vagina.   POSTOPERATIVE COMPLICATIONS:  None except for mild postoperative ileus which  responded to conservative therapy.   DISPOSITION:  The patient was in satisfactory and improved condition on the  morning of postoperative day #3.  She was discharged home to continue her  catheterization trials with the suprapubic catheter.  She was given Percocet  5 mg/325 mg to be taken one or two q.4h. as needed for postoperative pain.  She was to take Aleve along with this as needed.  She was to take a regular  diet.  She is to return to the office in 2 weeks or when she is having  adequate voiding and residual urine volumes.   Details of the present illness, past history, family history, review of  systems, and physical exam are recorded in the admission note.  Basically,  the admission findings were remarkable for pelvic relaxation with the  findings noted in the postoperative diagnoses.  Laboratory data during this  admission includes a normal hemoglobin of 13.2 on admission, postoperative  hemoglobin of 10.6, admission prothrombin time and PTT were normal.   Additional laboratory included a normal EKG.   HOSPITAL COURSE:  The patient was admitted on the morning of surgery.  She  was treated perioperatively with IV antibiotic and PAS compression hose.  The above-described surgical procedure was accomplished without  intraoperative difficulty.  Her postoperative course was satisfactory.  She  remained afebrile throughout her postoperative stay.  She did have mild  abdominal distention on postoperative day #1 and #2 but by the morning of  postoperative day  #3 her condition was considered to be good.  She was discharged home with  disposition as noted above.                                               Freddy Finner, M.D.    WRN/MEDQ  D:  01/25/2004  T:  01/27/2004  Job:  [id number]

## 2011-03-29 NOTE — H&P (Signed)
NAME:  [name], [name]                       ACCOUNT NO.:  [id number]   MEDICAL RECORD NO.:  [id number]                   PATIENT TYPE:  INP   LOCATION:  NA                                   FACILITY:  [facility]   PHYSICIAN:  Freddy Finner, M.D.                DATE OF BIRTH:  July 01, 1933   DATE OF ADMISSION:  12/19/2003  DATE OF DISCHARGE:                                HISTORY & PHYSICAL   ADMITTING DIAGNOSES:  1. Cystocele.  2. Rectocele.  3. Uterine descensus with symptoms.   HISTORY:  Patient is a 77 year old white married female gravida 7 para 7 who  has been known to have a cystocele for approximately the last 12 years or  so.  Her symptoms have increased to the point that it is troublesome for her  at this point, on physical examination she also has a small rectocele.  There is marked uterine descensus.  Due to the symptomatic __________ she is  now admitted for total vaginal hysterectomy, bilateral salpingo-  oophorectomy, anterior and posterior colporrhaphy.   CURRENT REVIEW OF SYSTEMS:  Otherwise negative.   PAST MEDICAL HISTORY:  Patient has no known significant medical illnesses.  She has no known allergies to medications.  She is not a smoker.  She has  never had a blood transfusion.   CURRENT MEDICATIONS:  Her current medications include estrogen vaginal cream  and Actonel 30 mg p.o. per week.  She does occasionally take Zantac for  esophageal reflux.   FAMILY HISTORY:  Noncontributory.   PHYSICAL EXAMINATION:  HEENT:  Grossly within normal limits.  Blood pressure  in the office is 110/66.  CHEST:  Clear to auscultation.  HEART:  Normal sinus rhythm without murmurs, rubs, or gallops.  ABDOMEN:  Soft and nontender without appreciable organomegaly or palpable  masses.  BREAST:  Normal.  No palpable masses.  No evidence of nipple discharge.  No  skin change.  PELVIC:  Findings are as described in the admitting diagnosis.  EXTREMITIES:  Without cyanosis, clubbing, or  edema.   ASSESSMENT:  Second degree cystocele, first degree rectocele, uterine  descensus, symptoms of the above.   PLAN:  Total vaginal hysterectomy, anterior and posterior repair, bilateral  salpingo-oophorectomy.  The patient has reviewed the video in the office  describing the operative procedure including the potential risks of the  procedure.  She is admitted now and prepared to proceed.                                               Freddy Finner, M.D.   WRN/MEDQ  D:  12/18/2003  T:  12/18/2003  Job:  [id number]

## 2011-03-29 NOTE — Op Note (Signed)
[name], [name]                       ACCOUNT NO.:  [id number]   MEDICAL RECORD NO.:  [id number]                   PATIENT TYPE:  INP   LOCATION:  9317                                 FACILITY:  [facility]   PHYSICIAN:  Freddy Finner, M.D.                DATE OF BIRTH:  24-Nov-1932   DATE OF PROCEDURE:  12/19/2003  DATE OF DISCHARGE:                                 OPERATIVE REPORT   PREOPERATIVE DIAGNOSES:  1. Marked uterine descensus.  2. Second degree cystocele.  3. First degree rectocele.  4. Symptoms secondary to above.   POSTOPERATIVE DIAGNOSES:  1. Marked uterine descensus.  2. Second degree cystocele.  3. First degree rectocele.  4. Symptoms secondary to above.   OPERATIVE PROCEDURES:  1. Total vaginal hysterectomy.  2. Bilateral salpingo-oophorectomy.  3. Anterior and posterior colporrhaphy.  4. Sacrospinous ligament suspension of the vagina.   SURGEON:  Freddy Finner, M.D.   ASSISTANT:  Raynald Kemp, M.D.   ESTIMATED INTRAOPERATIVE BLOOD LOSS:  150 mL.   ANESTHESIA:  General endotracheal.   INTRAOPERATIVE COMPLICATIONS:  None.   DESCRIPTION OF PROCEDURE:  Details of the present illness are recorded in  the admission note.  The patient was admitted on morning of surgery.  She  was given a bolus of cefotetan IV.  She was placed in PAS hose.  She was  brought to the operating room, placed under adequate general anesthesia, and  placed in the dorsolithotomy position using the [place] stirrup system.  Betadine prep of the lower abdomen, perineum, and vagina was carried out in  usual fashion with scrub followed by solution.  Sterile drapes were applied.  A posterior vaginal retractor was placed.  A Deaver retractor was used  laterally and anteriorly.  The cervix was grasped with a Christella Hartigan tenaculum.  A posterior colpotomy incision was made posterior the uterus while tenting  the mucosa with an Allis.  The mucosa was released from the cervix with a  scalpel.  The  LigaSure system was then used to develop the uterosacral  pedicles, which were sealed and divided.  Bladder pillars were taken  separately, sealed, and divided.  The bladder was carefully advanced off of  the cervix and lower uterine segment.  It was elected to give her  intravenous indigo carmine as a check on any injury to bladder.  This was  done at this point.  The anterior peritoneum was entered and confirmed.  The  LigaSure system was then used to seal the cardinal ligament pedicles and  then the uterine artery pedicles and one small pedicle above the uterine  arteries.  Each was sealed and divided sharply.  The uterus was delivered  through the vaginal introitus.  Curved Heaneys were placed across the utero-  ovarian pedicles on each side.  The uterus was excised.  The left tube and  ovary had adhesions of omentum and/or epiploic sigmoid colon  to the ovary  and these were easily lysed and the lysed segment bovied.  Each tube and  ovary was then grasped and the LigaSure system placed beyond the ovary on  the infundibulopelvic ligament.  The ligament was sealed and the tube and  ovary were removed.  This was relatively simple on the left.  There were  several pedicles required on the right.  Bleeding sources after were easily  controlled with the LigaSure system.  Hemostasis was noted to be adequate at  this point.  Moist tapes were used to pack the intestinal contents away from  the cuff.  The vaginal mucosa was anchored to the uterosacrals with mattress  sure of 0 Monocryl.  The uterosacrals were plicated and the posterior  peritoneum closed with a single interrupted 0 Monocryl suture.  The  posterior two-thirds of the cuff were then closed vertically with figure-of-  eight of 0 Monocryl.  The edges of the mucosa anteriorly were then grasped  with Allis.  The mucosa overlying the bladder and cystocele was tethered  with an Allis.  With progressive sharp and blunt dissection, the  mucosa was  stripped away from the bladder and the urogenital fascia.  An incision was  made in the midline to a level approximately 1.5 cm from the urethral  meatus.  With careful blunt and sharp dissection, the bladder and urethra  were stripped away from the vaginal mucosa.  The urogenital fascia was then  reapproximated midline using interrupted 0 Monocryl suture to elevate the  urethrovesical angle and to reduce the cystocele.  Segments of mucosa were  excised.  The urethral length was checked and urethral patency checked with  a Kelly clamp.  The mucosa was then closed with a running 2-0 Monocryl  suture.  This effectively completely closed the cuff.  Attention was then  turned posteriorly.  The edges of the mucosa on each side of the posterior  vaginal wall were grasped with an Allis clamp.  A pyramidal shaped segment  of skin was excised from the perineal body with the apex inferiorly.  The  mucosa overlying the rectum was then carefully bluntly and sharply dissected  free of the mucosa and an incision made along the posterior vaginal mucosa  in the midline for a distance of approximately 8 cm.  After careful  dissection of the pararectal tissues, the sacrospinous ligament was  identified palpably and carefully checked.  A 0 Dexon suture was then placed  using the shoot clamp.  This was then anchored to the upper posterior  vaginal mucosa using a Mayo needle.  The pararectal tissue was then plicated  in the midline to recreate the rectovaginal septum.  A total of four sutures  was used, as well as plicating the levators.  The mucosa was then partially  closed with a running 2-0 Monocryl suture and then the sacrospinous ligament  suspension suture tied down using a total of six to eight knots.  This  adequately elevated the vaginal apex.  The posterior incision was then  completely closed, including the perineum, with the 2-0 Monocryl.  The case was so dry that it was elected not to  place a pack.  The bladder was filled  approximately 300 mL of irrigating solution.  Blue and urine confirmed the  absence of any leakage.  A suprapubic Bonanno catheter was placed and the  catheter anchored to the skin using 3-0 nylon sutures.  The patient was then  awaken and taken to  recovery room in good condition.                                               Freddy Finner, M.D.    WRN/MEDQ  D:  12/19/2003  T:  12/19/2003  Job:  [id number]

## 2011-07-30 ENCOUNTER — Other Ambulatory Visit: Payer: Self-pay | Admitting: Dermatology

## 2011-10-01 ENCOUNTER — Telehealth: Payer: Self-pay | Admitting: Pulmonary Disease

## 2011-10-01 DIAGNOSIS — E7800 Pure hypercholesterolemia, unspecified: Secondary | ICD-10-CM

## 2011-10-01 DIAGNOSIS — M810 Age-related osteoporosis without current pathological fracture: Secondary | ICD-10-CM

## 2011-10-01 NOTE — Telephone Encounter (Signed)
Pt is requesting to have labs drawn prior to her 11/22/11 apt. Please advise Dr. Kriste Basque what labs pt will need. thanks

## 2011-10-01 NOTE — Telephone Encounter (Signed)
Left message advising will be able to come in before appointment to have lab work done fasting. Also advised pt of holiday hours this week for lab. Advised to call back for any additional questions or concerns. Labs entered into EPIC. Nothing further at this time.

## 2011-10-01 NOTE — Telephone Encounter (Signed)
Per SN---ok for lip, cbcd, bmp,hepat, tsh, vit d.  thanks

## 2011-11-14 ENCOUNTER — Other Ambulatory Visit: Payer: Self-pay | Admitting: Pulmonary Disease

## 2011-11-14 ENCOUNTER — Other Ambulatory Visit (INDEPENDENT_AMBULATORY_CARE_PROVIDER_SITE_OTHER): Payer: Medicare Other

## 2011-11-14 DIAGNOSIS — E7800 Pure hypercholesterolemia, unspecified: Secondary | ICD-10-CM

## 2011-11-14 DIAGNOSIS — M810 Age-related osteoporosis without current pathological fracture: Secondary | ICD-10-CM | POA: Diagnosis not present

## 2011-11-14 LAB — HEPATIC FUNCTION PANEL
ALT: 14 U/L (ref 0–35)
AST: 21 U/L (ref 0–37)
Albumin: 4.1 g/dL (ref 3.5–5.2)
Alkaline Phosphatase: 66 U/L (ref 39–117)
Bilirubin, Direct: 0.1 mg/dL (ref 0.0–0.3)
Total Bilirubin: 0.7 mg/dL (ref 0.3–1.2)
Total Protein: 7.1 g/dL (ref 6.0–8.3)

## 2011-11-14 LAB — CBC WITH DIFFERENTIAL/PLATELET
Basophils Absolute: 0 10*3/uL (ref 0.0–0.1)
Basophils Relative: 0.8 % (ref 0.0–3.0)
Eosinophils Absolute: 0.2 10*3/uL (ref 0.0–0.7)
Eosinophils Relative: 2.9 % (ref 0.0–5.0)
HCT: 40.5 % (ref 36.0–46.0)
Hemoglobin: 13.6 g/dL (ref 12.0–15.0)
Lymphocytes Relative: 32.7 % (ref 12.0–46.0)
Lymphs Abs: 1.7 10*3/uL (ref 0.7–4.0)
MCHC: 33.6 g/dL (ref 30.0–36.0)
MCV: 95.7 fl (ref 78.0–100.0)
Monocytes Absolute: 0.6 10*3/uL (ref 0.1–1.0)
Monocytes Relative: 10.4 % (ref 3.0–12.0)
Neutro Abs: 2.8 10*3/uL (ref 1.4–7.7)
Neutrophils Relative %: 53.2 % (ref 43.0–77.0)
Platelets: 265 10*3/uL (ref 150.0–400.0)
RBC: 4.24 Mil/uL (ref 3.87–5.11)
RDW: 12.6 % (ref 11.5–14.6)
WBC: 5.3 10*3/uL (ref 4.5–10.5)

## 2011-11-14 LAB — LIPID PANEL
Cholesterol: 228 mg/dL — ABNORMAL HIGH (ref 0–200)
HDL: 54.2 mg/dL (ref >39.00)
Total CHOL/HDL Ratio: 4
Triglycerides: 76 mg/dL (ref 0.0–149.0)
VLDL: 15.2 mg/dL (ref 0.0–40.0)

## 2011-11-14 LAB — LDL CHOLESTEROL, DIRECT: Direct LDL: 158.8 mg/dL

## 2011-11-14 LAB — TSH: TSH: 3.19 u[IU]/mL (ref 0.35–5.50)

## 2011-11-15 LAB — VITAMIN D 25 HYDROXY (VIT D DEFICIENCY, FRACTURES): Vit D, 25-Hydroxy: 84 ng/mL (ref 30–89)

## 2011-11-22 ENCOUNTER — Encounter: Payer: Self-pay | Admitting: Pulmonary Disease

## 2011-11-22 ENCOUNTER — Ambulatory Visit (INDEPENDENT_AMBULATORY_CARE_PROVIDER_SITE_OTHER): Payer: Medicare Other | Admitting: Pulmonary Disease

## 2011-11-22 DIAGNOSIS — M545 Low back pain, unspecified: Secondary | ICD-10-CM

## 2011-11-22 DIAGNOSIS — M542 Cervicalgia: Secondary | ICD-10-CM | POA: Diagnosis not present

## 2011-11-22 DIAGNOSIS — M810 Age-related osteoporosis without current pathological fracture: Secondary | ICD-10-CM

## 2011-11-22 DIAGNOSIS — D126 Benign neoplasm of colon, unspecified: Secondary | ICD-10-CM | POA: Diagnosis not present

## 2011-11-22 DIAGNOSIS — M1990 Unspecified osteoarthritis, unspecified site: Secondary | ICD-10-CM

## 2011-11-22 DIAGNOSIS — E7800 Pure hypercholesterolemia, unspecified: Secondary | ICD-10-CM

## 2011-11-22 NOTE — Patient Instructions (Addendum)
Today we updated your med list in our EPIC system...    Continue your current medications the same...  We reviewed your recent fasting blood work & gave you a copy for your records...  We will arrange for a consultation at the [facility] at your convenience...  Call for any problems or if we can be of service in any way.Marland KitchenMarland Kitchen

## 2011-11-27 ENCOUNTER — Encounter: Payer: Medicare Other | Admitting: *Deleted

## 2011-11-28 DIAGNOSIS — M810 Age-related osteoporosis without current pathological fracture: Secondary | ICD-10-CM | POA: Diagnosis not present

## 2011-12-02 DIAGNOSIS — M810 Age-related osteoporosis without current pathological fracture: Secondary | ICD-10-CM | POA: Diagnosis not present

## 2011-12-20 ENCOUNTER — Encounter: Payer: Self-pay | Admitting: Pulmonary Disease

## 2011-12-20 NOTE — Progress Notes (Signed)
Subjective:     Patient ID: [name], female   DOB: [birth year], 78 y.o.   MRN: [id number]  HPI 78 y/o WF here for a follow up visit...   ~  Apr10:  she is doing well overall without new complaints or concerns... she has been under increased stress w/ her husb's bladder cancer (Rx by DrOttelin), and her grandson's recent Dx w/ an anaplastic ependymoma (s/p neurosurg & XRT)... she doesn't like taking meds and refuses statin therapy for her cholesterol... she is also followed by [facility] for GYN and she reports that he has checked her BMD (she remains on Forteo under his direction), and Vit D level (on OTC Vit D supplement)...   ~  February 26, 2010:  she is concerned because podiatrist DrTuckman dx "vasospasm" ?Raynaud's in her second toe bilaterally- she notes that it gets cold, turns purple, & "peels" in the winter... there is no pain, no involvement of other toes or fingers, and periph pulses are totally WNL.Marland Kitchen. we discussed this in detail & she is not inclined to try new meds (eg- Procardia)... sshe will f/u w/ Ortho (new foot doc at [facility] office) for their opinion...  otherw she is doing well- notes some neck pain but hasn't tried any meds & doesn't want pain Rx, she will also have this looked at by Ortho DrLucey... she will return for FASTING labs in several week.  ~  November 22, 2011:  79mo ROV & [name] reports that she is doing well, feels good & has no new complaints or concerns;  She is quite proud of the fact that she is not on any prescription medications> just OTC supplements like ASA, Niacin, Fish Oil RYR, Calcium, MVI, VitD;  She notes that there is an expanding hx of CAD in her family w/ a sister who had high chol being Dx w/ CAD & had CABG, one brother w/ stent;  We reviewed the strategy of risk factor reductions & I note that she has repeatedly refused Statin Rx for her dyslipidemia but she will accept referral to the Lipid clinic to discuss her options for treatment...    She  maintains GYN follow up w/ DrNeal for Mammograms/ PAP/ BMD & she tells me that he is referring her to DrAltheimer to discuss options for treating her Osteoporosis...    She is followed by [facility] for Ortho & while we don't have any notes from him to review the pt reports eval for neck pain in 2011 w/ arthritis and spondylosis on XRays & MRI work up;          Problem List:  PHYSICAL EXAMINATION (ICD-V70.0) - on ASA 81mg /d... GYN= DrNeal for Pap, Mammograms, BMD- she reports he started FORTEO shots 8/08 and stopped after 48yrs therapy... she notes that BMD is improved- now on Caltrate alone... she had a Pneumovax in 2004, and gets yearly Flu shots... "I'm not a pill taker" she says... ~  CXR 4/10 showed RLL scarring, sl overinflated, otherw neg CXR w/ NAD...  HYPERCHOLESTEROLEMIA (ICD-272.0) - on diet alone, she has refused my rec's for Statin therapy in the past... states she's taking red yeast rice, Krill oil, and OTC Niacin... ~  FLP's over the last 69yrs: TChol 168-213, TG 72-113, HDL 44-53, LDL 107-145... ~  FLP 2/08 showed TChol 190, TG 78, HDL 48, LDL 126... she wanted to continue diet Rx. ~  FLP 4/09 showed TChol 234, TG 113, HDL 52, LDL 173... pt refused Simva rx. ~  FLP 4/10  showed TChol 223, TG 95, HDL 57, LDL 138... she wants to continue diet alone. ~  FLP 5/11 showed TChol 216, TG 76, HDL 51, LDL 150 ~  FLP 1/13 showed TChol 228, TG 76, LDL 54, LDL 159... Several sibs w/ recent CAD & she agrees to Lipid Clinic appt.  COLONIC POLYPS (ICD-211.3) - prev exams showed divertics, polyps- last 1/04= hyperplastic. ~  last colonoscopy 3/08 by DrPatterson was normal- no recurrent polyps... f/u planned 70yrs.  NECK PAIN (ICD-723.1) - c/o neck pain on & off... CSpine films 4/09 were neg but MRI 9/11 showed arthropathy & spondylosis... she refuses pain meds and will f/u w/ her Orthopedist DrLucey (he rec consult w/ DrHirsh). ~  She notes that DrLucey rec shots from DrNewton> she tried it but no  better she says... ~  She reports trying Celebrex but it was too strong, caused SOB she says & she stopped it...  Hx of BACK PAIN, LUMBAR (ICD-724.2) - s/p lumbar laminectomy 1985 by DrGioffre...  OSTEOPOROSIS (ICD-733.00) - as noted, took FORTEO x2yrs per DrNeal and he follows her BMD's (we don't have records)... prev on Evista, still takes ca++/ vits... she was intol to Actonel Rx in the past...   Past Surgical History  Procedure Date  . Lumbar laminectomy 1985    Dr. Darrelyn Hillock  . Surgery for removal of rectal polyp 07/1995    Dr. Cindee Lame  . Abdominal hysterectomy 12/2003    DR. Neal--bso, A/P repair  . Cataract surgery     Outpatient Encounter Prescriptions as of 11/22/2011  Medication Sig Dispense Refill  . aspirin 81 MG tablet Take 81 mg by mouth daily.        . Calcium Carbonate-Vitamin D (CALTRATE 600+D) 600-400 MG-UNIT per tablet Take 2 tablets by mouth daily.        . cholecalciferol (VITAMIN D) 1000 UNITS tablet Take 2,000 Units by mouth daily.       . Multiple Vitamin (MULTIVITAMINS PO) Take 1 tablet by mouth daily.        . niacin 500 MG tablet Take 500 mg by mouth daily with breakfast.        . Omega-3 Fatty Acids (FISH OIL) 300 MG CAPS Take 1 capsule by mouth daily.        . Red Yeast Rice 600 MG CAPS Take 1 capsule by mouth daily.          Allergies  Allergen Reactions  . Hydrocodone     REACTION: nausea  . Risedronate Sodium     REACTION: INTOL to Actonel    Current Medications, Allergies, Past Medical History, Past Surgical History, Family History, and Social History were reviewed in [employer] record.    Review of Systems        See HPI - all other systems neg except as noted...  The patient denies anorexia, fever, weight loss, weight gain, vision loss, decreased hearing, hoarseness, chest pain, syncope, dyspnea on exertion, peripheral edema, prolonged cough, headaches, hemoptysis, abdominal pain, melena, hematochezia, severe  indigestion/heartburn, hematuria, incontinence, muscle weakness, suspicious skin lesions, transient blindness, difficulty walking, depression, unusual weight change, abnormal bleeding, enlarged lymph nodes, and angioedema.     Objective:   Physical Exam     WD, WN, 78 y/o WF in NAD... GENERAL:  Alert & oriented; pleasant & cooperative... HEENT:  /AT, EOM-wnl, PERRLA, EACs-clear, TMs-wnl, NOSE-clear, THROAT-clear drainage, no lesion.  NECK:  Supple w/ decrROM; no JVD; normal carotid impulses w/o bruits; no thyromegaly or  nodules palpated; no lymphadenopathy. CHEST:  Clear to P & A; without wheezes/ rales/ or rhonchi. HEART:  Regular Rhythm; without murmurs/ rubs/ or gallops. ABDOMEN:  Soft & nontender; normal bowel sounds; no organomegaly or masses detected. EXT: without deformities, +arthritic changes in hands; no varicose veins/ venous insuffic/ or edema; distal pulses all normal. NEURO:  no focal neuro deficits... DERM:  no lesions seen...  RADIOLOGY DATA:  Reviewed in the EPIC EMR & discussed w/ the patient...  LABORATORY DATA:  Reviewed in the EPIC EMR & discussed w/ the patient...    >>LABS:  FLP w/ [id number], [id number];  Cherms-wnl;  CBC-wnl;  TSH-wnl;  VitD-84   Assessment:     +FamHx of heart disease>  We reviewed the need for risk factor reduction strategy & she agrees to go to the Lipid Clinic now at age 34...  Hyperlipidemia>  Her LDL is up to 159 but she is sure this came from overeating over the holidays; we reviewed a low chol/ low fat diet & she will continue her exercise program; she has agreed to see the PharmD in the Lipid Clinic...  Hx Colon Polyps>  She will be due for a follow up colonoscopy from DrPatterson this year; she is reminded to give his office a call if she doesn't get a letter from them...  DJD, Neck Pain>  She declines prescription anti-inflamm Rx etc; we reviewed avail OTC meds; she will continue her f/u w/ DrLucey...  Osteoporosis>  Followed & managed  by Latanya Presser w/ BMDs at his office (we don't have copies); sahe had Forteo under his direction for 9yrs; she tells me he has referred her on to DrAltheimer for his suggestions...     Plan:     Patient's Medications  New Prescriptions   No medications on file  Previous Medications   ASPIRIN 81 MG TABLET    Take 81 mg by mouth daily.     CALCIUM CARBONATE-VITAMIN D (CALTRATE 600+D) 600-400 MG-UNIT PER TABLET    Take 2 tablets by mouth daily.     CHOLECALCIFEROL (VITAMIN D) 1000 UNITS TABLET    Take 2,000 Units by mouth daily.    MULTIPLE VITAMIN (MULTIVITAMINS PO)    Take 1 tablet by mouth daily.     NIACIN 500 MG TABLET    Take 500 mg by mouth daily with breakfast.     OMEGA-3 FATTY ACIDS (FISH OIL) 300 MG CAPS    Take 1 capsule by mouth daily.     RED YEAST RICE 600 MG CAPS    Take 1 capsule by mouth daily.    Modified Medications   No medications on file  Discontinued Medications   No medications on file

## 2011-12-30 ENCOUNTER — Ambulatory Visit (INDEPENDENT_AMBULATORY_CARE_PROVIDER_SITE_OTHER): Payer: Medicare Other | Admitting: Family Medicine

## 2011-12-30 VITALS — BP 135/70 | HR 77 | Temp 98.0°F | Resp 16 | Ht 60.0 in | Wt 106.0 lb

## 2011-12-30 DIAGNOSIS — J069 Acute upper respiratory infection, unspecified: Secondary | ICD-10-CM

## 2011-12-30 NOTE — Progress Notes (Signed)
  Patient Name: [name] Date of Birth: 01-12-1933 Medical Record Number: [id number] Gender: female Date of Encounter: 12/30/2011  History of Present Illness:  [name] is a 78 y.o. very pleasant female patient who presents with the following:  She has noted cough/sneeze/runny nose for about one week.  Started with mild cough and then other symptoms developed.  Cough was productive at first but this has resolved.  No fever- she has checked. Has felt cold but it has been cold outside.  No body aches.  No GI symptoms.  She feels that she is probably getting better but wanted to be sure  Patient Active Problem List  Diagnoses  . COLONIC POLYPS  . HYPERCHOLESTEROLEMIA  . NECK PAIN  . BACK PAIN, LUMBAR  . OSTEOPOROSIS  . DJD (degenerative joint disease)   Past Medical History  Diagnosis Date  . Hypercholesterolemia   . Hx of colonic polyps   . Neck pain   . Lumbar back pain   . Osteoporosis    Past Surgical History  Procedure Date  . Lumbar laminectomy 1985    Dr. Darrelyn Hillock  . Surgery for removal of rectal polyp 07/1995    Dr. Cindee Lame  . Abdominal hysterectomy 12/2003    DR. Neal--bso, A/P repair  . Cataract surgery    History  Substance Use Topics  . Smoking status: Former Smoker -- 1 years    Types: Cigarettes  . Smokeless tobacco: Never Used  . Alcohol Use: 3.5 oz/week    7 drink(s) per week     wine with dinner   History reviewed. No pertinent family history. Allergies  Allergen Reactions  . Hydrocodone     REACTION: nausea  . Risedronate Sodium     REACTION: INTOL to Actonel    Medication list has been reviewed and updated.  Review of Systems: As per HPI- otherwise negative.  Has not tried and OTC meds except ibuprofen.    Physical Examination: Filed Vitals:   12/30/11 0850  BP: 135/70  Pulse: 77  Temp: 98 F (36.7 C)  TempSrc: Oral  Resp: 16  Height: 5' (1.524 m)  Weight: 106 lb (48.081 kg)    Body mass index is 20.70  kg/(m^2).  GEN: WDWN, NAD, Non-toxic, A & O x 3, appears somewhat younger than stated age.  HEENT: Atraumatic, Normocephalic. Neck supple. No masses, No LAD., oropharynx wnl.  PEERL Ears and Nose: No external deformity.  TM wnl bilaterally, nasal cavity negative.  CV: RRR, No M/G/R. No JVD. No thrill. No extra heart sounds. PULM: CTA B, no wheezes, crackles, rhonchi. No retractions. No resp. distress. No accessory muscle use. ABD: S, NT, ND. EXTR: No c/c/e NEURO Normal gait.  PSYCH: Normally interactive. Conversant. Not depressed or anxious appearing.  Calm demeanor.    Assessment and Plan:  zpack to hold- can use if not better in a few days.   Likely viral URI so she may continue conservative therapy as desired, but has zpack to fall back on.  Let us know if she is not better or if she gets worse!

## 2012-06-24 DIAGNOSIS — Z1212 Encounter for screening for malignant neoplasm of rectum: Secondary | ICD-10-CM | POA: Diagnosis not present

## 2012-06-24 DIAGNOSIS — Z1231 Encounter for screening mammogram for malignant neoplasm of breast: Secondary | ICD-10-CM | POA: Diagnosis not present

## 2012-06-24 DIAGNOSIS — Z130 Encounter for screening for diseases of the blood and blood-forming organs and certain disorders involving the immune mechanism: Secondary | ICD-10-CM | POA: Diagnosis not present

## 2012-06-24 DIAGNOSIS — Z01419 Encounter for gynecological examination (general) (routine) without abnormal findings: Secondary | ICD-10-CM | POA: Diagnosis not present

## 2012-08-11 DIAGNOSIS — Z23 Encounter for immunization: Secondary | ICD-10-CM | POA: Diagnosis not present

## 2012-08-17 DIAGNOSIS — M47812 Spondylosis without myelopathy or radiculopathy, cervical region: Secondary | ICD-10-CM | POA: Diagnosis not present

## 2012-08-17 DIAGNOSIS — M542 Cervicalgia: Secondary | ICD-10-CM | POA: Diagnosis not present

## 2012-09-16 ENCOUNTER — Encounter: Payer: Self-pay | Admitting: Gastroenterology

## 2012-11-19 ENCOUNTER — Other Ambulatory Visit (INDEPENDENT_AMBULATORY_CARE_PROVIDER_SITE_OTHER): Payer: Medicare Other

## 2012-11-19 ENCOUNTER — Ambulatory Visit (INDEPENDENT_AMBULATORY_CARE_PROVIDER_SITE_OTHER): Payer: Medicare Other | Admitting: Adult Health

## 2012-11-19 ENCOUNTER — Encounter: Payer: Self-pay | Admitting: Adult Health

## 2012-11-19 ENCOUNTER — Ambulatory Visit (INDEPENDENT_AMBULATORY_CARE_PROVIDER_SITE_OTHER)
Admission: RE | Admit: 2012-11-19 | Discharge: 2012-11-19 | Disposition: A | Discharge status: Home / Self Care | Payer: Medicare Other | Source: Ambulatory Visit | Attending: Adult Health | Admitting: Adult Health

## 2012-11-19 VITALS — BP 116/70 | HR 63 | Temp 98.0°F | Ht 60.0 in | Wt 106.6 lb

## 2012-11-19 DIAGNOSIS — I499 Cardiac arrhythmia, unspecified: Secondary | ICD-10-CM

## 2012-11-19 DIAGNOSIS — R079 Chest pain, unspecified: Secondary | ICD-10-CM | POA: Diagnosis not present

## 2012-11-19 DIAGNOSIS — I4892 Unspecified atrial flutter: Secondary | ICD-10-CM | POA: Diagnosis not present

## 2012-11-19 NOTE — Patient Instructions (Addendum)
We are referring you to cardiology for evaluation  I will call with lab and xray results.  Prilosec 20mg  daily for 1 week  Gas x with meals As needed   Please contact office for sooner follow up if symptoms do not improve or worsen or seek emergency care  If episode occurs again will need sooner evaluation or ER

## 2012-11-19 NOTE — Progress Notes (Signed)
Subjective:     Patient ID: [name], female   DOB: 25-Mar-1933, 79 y.o.   MRN: [id number]  HPI  79 y/o WF here for a follow up visit...   ~  Apr10:  she is doing well overall without new complaints or concerns... she has been under increased stress w/ her husb's bladder cancer (Rx by DrOttelin), and her grandson's recent Dx w/ an anaplastic ependymoma (s/p neurosurg & XRT)... she doesn't like taking meds and refuses statin therapy for her cholesterol... she is also followed by [facility] for GYN and she reports that he has checked her BMD (she remains on Forteo under his direction), and Vit D level (on OTC Vit D supplement)...   ~  February 26, 2010:  she is concerned because podiatrist DrTuckman dx "vasospasm" ?Raynaud's in her second toe bilaterally- she notes that it gets cold, turns purple, & "peels" in the winter... there is no pain, no involvement of other toes or fingers, and periph pulses are totally WNL.Marland Kitchen. we discussed this in detail & she is not inclined to try new meds (eg- Procardia)... sshe will f/u w/ Ortho (new foot doc at [facility] office) for their opinion...  otherw she is doing well- notes some neck pain but hasn't tried any meds & doesn't want pain Rx, she will also have this looked at by Ortho DrLucey... she will return for FASTING labs in several week.  ~  November 22, 2011:  74mo ROV & [name] reports that she is doing well, feels good & has no new complaints or concerns;  She is quite proud of the fact that she is not on any prescription medications> just OTC supplements like ASA, Niacin, Fish Oil RYR, Calcium, MVI, VitD;  She notes that there is an expanding hx of CAD in her family w/ a sister who had high chol being Dx w/ CAD & had CABG, one brother w/ stent;  We reviewed the strategy of risk factor reductions & I note that she has repeatedly refused Statin Rx for her dyslipidemia but she will accept referral to the Lipid clinic to discuss her options for treatment...    She  maintains GYN follow up w/ DrNeal for Mammograms/ PAP/ BMD & she tells me that he is referring her to DrAltheimer to discuss options for treating her Osteoporosis...    She is followed by [facility] for Ortho & while we don't have any notes from him to review the pt reports eval for neck pain in 2011 w/ arthritis and spondylosis on XRays & MRI work up;  11/19/2012 Acute OV  c/o heart pounding/fluttering, bloating/gas, some nausea onset at 4:30 this AM lasting approx 1 hour - denies f/c/s, vomiting, vision changes, cheat pain, tightness Symptoms have resolved with no reoccurence. No chest pain, dyspnea, syncope or palpitations.  The symptoms woke her up from sleep approximately 4:30 this morning. She says that she felt a fluttering pounding in her chest. She also has some epigastric discomfort, bloating, and increased gas. She had no exertional chest pain, diaphoresis, jaw pain, shoulder pain, or arm pain. She is very active and exercises 3 days a week. She does have a family history of heart disease, and she has a history of hyperlipidemia. She denies any hemoptysis, orthopnea, PND, leg swelling.          Problem List:  PHYSICAL EXAMINATION (ICD-V70.0) - on ASA 81mg /d... GYN= DrNeal for Pap, Mammograms, BMD- she reports he started FORTEO shots 8/08 and stopped after 63yrs therapy... she notes  that BMD is improved- now on Caltrate alone... she had a Pneumovax in 2004, and gets yearly Flu shots... "I'm not a pill taker" she says... ~  CXR 4/10 showed RLL scarring, sl overinflated, otherw neg CXR w/ NAD...  HYPERCHOLESTEROLEMIA (ICD-272.0) - on diet alone, she has refused my rec's for Statin therapy in the past... states she's taking red yeast rice, Krill oil, and OTC Niacin... ~  FLP's over the last 43yrs: TChol 168-213, TG 72-113, HDL 44-53, LDL 107-145... ~  FLP 2/08 showed TChol 190, TG 78, HDL 48, LDL 126... she wanted to continue diet Rx. ~  FLP 4/09 showed TChol 234, TG 113, HDL 52, LDL 173... pt  refused Simva rx. ~  FLP 4/10 showed TChol 223, TG 95, HDL 57, LDL 138... she wants to continue diet alone. ~  FLP 5/11 showed TChol 216, TG 76, HDL 51, LDL 150 ~  FLP 1/13 showed TChol 228, TG 76, LDL 54, LDL 159... Several sibs w/ recent CAD & she agrees to Lipid Clinic appt.  COLONIC POLYPS (ICD-211.3) - prev exams showed divertics, polyps- last 1/04= hyperplastic. ~  last colonoscopy 3/08 by DrPatterson was normal- no recurrent polyps... f/u planned 69yrs.  NECK PAIN (ICD-723.1) - c/o neck pain on & off... CSpine films 4/09 were neg but MRI 9/11 showed arthropathy & spondylosis... she refuses pain meds and will f/u w/ her Orthopedist DrLucey (he rec consult w/ DrHirsh). ~  She notes that DrLucey rec shots from DrNewton> she tried it but no better she says... ~  She reports trying Celebrex but it was too strong, caused SOB she says & she stopped it...  Hx of BACK PAIN, LUMBAR (ICD-724.2) - s/p lumbar laminectomy 1985 by DrGioffre...  OSTEOPOROSIS (ICD-733.00) - as noted, took FORTEO x50yrs per DrNeal and he follows her BMD's (we don't have records)... prev on Evista, still takes ca++/ vits... she was intol to Actonel Rx in the past...   Past Surgical History  Procedure Date  . Lumbar laminectomy 1985    Dr. Darrelyn Hillock  . Surgery for removal of rectal polyp 07/1995    Dr. Cindee Lame  . Abdominal hysterectomy 12/2003    DR. Neal--bso, A/P repair  . Cataract surgery     Outpatient Encounter Prescriptions as of 11/19/2012  Medication Sig Dispense Refill  . aspirin 81 MG tablet Take 81 mg by mouth daily.        . Calcium Carbonate-Vitamin D (CALTRATE 600+D) 600-400 MG-UNIT per tablet Take 1 tablet by mouth 2 (two) times daily.       . cholecalciferol (VITAMIN D) 1000 UNITS tablet Take 2,000 Units by mouth daily.       . Multiple Vitamin (MULTIVITAMINS PO) Take 1 tablet by mouth daily.        . niacin 500 MG tablet Take 500 mg by mouth daily with breakfast.        . Omega-3 Fatty Acids (FISH  OIL) 300 MG CAPS Take 1 capsule by mouth daily.        . [DISCONTINUED] Red Yeast Rice 600 MG CAPS Take 1 capsule by mouth daily.          Allergies  Allergen Reactions  . Hydrocodone     REACTION: nausea  . Risedronate Sodium     REACTION: INTOL to Actonel    Current Medications, Allergies, Past Medical History, Past Surgical History, Family History, and Social History were reviewed in [employer] record.    Review of Systems  Constitutional:   No  weight loss, night sweats,  Fevers, chills, fatigue, or  lassitude.  HEENT:   No headaches,  Difficulty swallowing,  Tooth/dental problems, or  Sore throat,                No sneezing, itching, ear ache, nasal congestion, post nasal drip,   CV:  No chest pain,  Orthopnea, PND, swelling in lower extremities, anasarca, dizziness, palpitations, syncope.   GI  No  , abdominal pain, nausea, vomiting, diarrhea, change in bowel habits, loss of appetite, bloody stools.   Resp: No shortness of breath with exertion or at rest.  No excess mucus, no productive cough,  No non-productive cough,  No coughing up of blood.  No change in color of mucus.  No wheezing.  No chest wall deformity  Skin: no rash or lesions.  GU: no dysuria, change in color of urine, no urgency or frequency.  No flank pain, no hematuria   MS:  No joint pain or swelling.  No decreased range of motion.  No back pain.  Psych:  No change in mood or affect. No depression or anxiety.  No memory loss.       Objective:   Physical Exam      WD, WN, 79 y/o WF in NAD... GENERAL:  Alert & oriented; pleasant & cooperative... HEENT:  [place]/AT,   EACs-clear, TMs-wnl, NOSE-clear, THROAT-clear drainage, no lesion.  NECK:  Supple   no JVD; normal carotid impulses w/o bruits; no thyromegaly or nodules palpated; no lymphadenopathy. CHEST:  Clear to P & A; without wheezes/ rales/ or rhonchi. HEART:  Regular Rhythm; without murmurs/ rubs/ or  gallops. ABDOMEN:  Soft & nontender; normal bowel sounds; no organomegaly or masses detected. EXT: without deformities, +arthritic changes in hands; no varicose veins/ venous insuffic/ or edema; distal pulses all normal. NEURO:  no focal neuro deficits... DERM:  no lesions seen...  EKG : NSR , no acute process, reviewed with Dr. Kriste Basque    Assessment:

## 2012-11-20 DIAGNOSIS — R079 Chest pain, unspecified: Secondary | ICD-10-CM | POA: Insufficient documentation

## 2012-11-20 LAB — CBC WITH DIFFERENTIAL/PLATELET
Basophils Absolute: 0.1 10*3/uL (ref 0.0–0.1)
Basophils Relative: 1 % (ref 0.0–3.0)
Eosinophils Absolute: 0.1 10*3/uL (ref 0.0–0.7)
Eosinophils Relative: 1.1 % (ref 0.0–5.0)
HCT: 40.7 % (ref 36.0–46.0)
Hemoglobin: 13.6 g/dL (ref 12.0–15.0)
Lymphocytes Relative: 26 % (ref 12.0–46.0)
Lymphs Abs: 2 10*3/uL (ref 0.7–4.0)
MCHC: 33.4 g/dL (ref 30.0–36.0)
MCV: 94.6 fl (ref 78.0–100.0)
Monocytes Absolute: 0.8 10*3/uL (ref 0.1–1.0)
Monocytes Relative: 11 % (ref 3.0–12.0)
Neutro Abs: 4.7 10*3/uL (ref 1.4–7.7)
Neutrophils Relative %: 60.9 % (ref 43.0–77.0)
Platelets: 281 10*3/uL (ref 150.0–400.0)
RBC: 4.31 Mil/uL (ref 3.87–5.11)
RDW: 12.6 % (ref 11.5–14.6)
WBC: 7.6 10*3/uL (ref 4.5–10.5)

## 2012-11-20 LAB — BASIC METABOLIC PANEL
BUN: 17 mg/dL (ref 6–23)
CO2: 28 mEq/L (ref 19–32)
Calcium: 9.7 mg/dL (ref 8.4–10.5)
Chloride: 105 mEq/L (ref 96–112)
Creatinine, Ser: 0.9 mg/dL (ref 0.4–1.2)
GFR: 62.76 mL/min (ref >60.00)
Glucose, Bld: 99 mg/dL (ref 70–99)
Potassium: 4.1 mEq/L (ref 3.5–5.1)
Sodium: 139 mEq/L (ref 135–145)

## 2012-11-20 LAB — HEPATIC FUNCTION PANEL
ALT: 15 U/L (ref 0–35)
AST: 21 U/L (ref 0–37)
Albumin: 4.2 g/dL (ref 3.5–5.2)
Alkaline Phosphatase: 61 U/L (ref 39–117)
Bilirubin, Direct: 0.1 mg/dL (ref 0.0–0.3)
Total Bilirubin: 0.5 mg/dL (ref 0.3–1.2)
Total Protein: 7.3 g/dL (ref 6.0–8.3)

## 2012-11-20 LAB — TROPONIN I: Troponin I: 0.01 ng/mL (ref <0.06)

## 2012-11-20 NOTE — Progress Notes (Signed)
Quick Note:  Called spoke with patient, advised of cxr results / recs as stated by TP. Pt verbalized her understanding and denied any questions. ______ 

## 2012-11-20 NOTE — Assessment & Plan Note (Addendum)
Atypical Chest tightness and palpitations ? GERD  Increase Risk Factors -refer to Cards  Labs and x-ray are pending.  Plan We are referring you to cardiology for evaluation  I will call with lab and xray results.  Prilosec 20mg  daily for 1 week  Gas x with meals As needed   Please contact office for sooner follow up if symptoms do not improve or worsen or seek emergency care  If episode occurs again will need sooner evaluation or ER

## 2012-11-20 NOTE — Progress Notes (Signed)
Quick Note:  [email or web address] ______ 

## 2012-11-24 NOTE — Progress Notes (Signed)
Quick Note:  Left detailed message on named answering machine as discussed/approved with patient regarding her lab results / recs as stated by TP. ______

## 2012-12-01 ENCOUNTER — Ambulatory Visit: Payer: Medicare Other | Admitting: Cardiovascular Disease

## 2012-12-04 ENCOUNTER — Encounter: Payer: Self-pay | Admitting: Cardiovascular Disease

## 2012-12-04 ENCOUNTER — Ambulatory Visit (INDEPENDENT_AMBULATORY_CARE_PROVIDER_SITE_OTHER): Payer: Medicare Other | Admitting: Cardiovascular Disease

## 2012-12-04 VITALS — BP 133/78 | HR 74 | Wt 105.0 lb

## 2012-12-04 DIAGNOSIS — R002 Palpitations: Secondary | ICD-10-CM | POA: Diagnosis not present

## 2012-12-04 DIAGNOSIS — E7800 Pure hypercholesterolemia, unspecified: Secondary | ICD-10-CM | POA: Diagnosis not present

## 2012-12-04 NOTE — Assessment & Plan Note (Signed)
Does not want to take statins.  Diet discussed

## 2012-12-04 NOTE — Progress Notes (Signed)
Patient ID: [name], female   DOB: 02-15-1933, 79 y.o.   MRN: [id number] 79 yo referred by Jeanmarie Plant c/o heart pounding/fluttering, bloating/gas, some nausea onset at 4:30 this AM lasting approx 1 hour - denies f/c/s, vomiting, vision changes, cheat pain, tightness Symptoms have resolved with no reoccurence. No chest pain, dyspnea, syncope or palpitations. The symptoms woke her up from sleep approximately 4:30 this morning. She says that she felt a fluttering pounding in her chest. She also has some epigastric discomfort, bloating,and increased gas. She had no exertional chest pain, diaphoresis, jaw pain, shoulder pain, or arm pain. She is very active and exercises 3 days a week. She does have a family history of heart disease, and she has a history of hyperlipidemia. She denies any hemoptysis, orthopnea, PND, leg swelling.  She has had no recurrence She thinks the whole episode was from some Talapia she ate.  She works out at [employer] 3/xweek with no issues.    I take care of her husband Ron  ROS: Denies fever, malais, weight loss, blurry vision, decreased visual acuity, cough, sputum, SOB, hemoptysis, pleuritic pain, palpitaitons, heartburn, abdominal pain, melena, lower extremity edema, claudication, or rash.  All other systems reviewed and negative   General: Affect appropriate Healthy:  appears stated age HEENT: normal Neck supple with no adenopathy JVP normal no bruits no thyromegaly Lungs clear with no wheezing and good diaphragmatic motion Heart:  S1/S2 no murmur,rub, gallop or click PMI normal Abdomen: benighn, BS positve, no tenderness, no AAA no bruit.  No HSM or HJR Distal pulses intact with no bruits No edema Neuro non-focal Skin warm and dry No muscular weakness  Medications Current Outpatient Prescriptions  Medication Sig Dispense Refill  . aspirin 81 MG tablet Take 81 mg by mouth daily.        . Calcium Carbonate-Vitamin D (CALTRATE 600+D) 600-400 MG-UNIT per  tablet Take 1 tablet by mouth 2 (two) times daily.       . cholecalciferol (VITAMIN D) 1000 UNITS tablet Take 2,000 Units by mouth daily.       . Multiple Vitamin (MULTIVITAMINS PO) Take 1 tablet by mouth daily.        . niacin 500 MG tablet Take 500 mg by mouth daily with breakfast.        . Omega-3 Fatty Acids (FISH OIL) 300 MG CAPS Take 1 capsule by mouth daily.          Allergies Hydrocodone and Risedronate sodium  Family History: No family history on file.  Social History: History   Social History  . Marital Status: Married    Spouse Name: N/A    Number of Children: 7  . Years of Education: N/A   Occupational History  . Not on file.   Social History Main Topics  . Smoking status: Former Smoker -- 1 years    Types: Cigarettes  . Smokeless tobacco: Never Used  . Alcohol Use: 3.5 oz/week    7 drink(s) per week     Comment: wine with dinner  . Drug Use: No  . Sexually Active: Not on file   Other Topics Concern  . Not on file   Social History Narrative  . No narrative on file    Electrocardiogram:  11/19/12  SR rate 60 normal  Assessment and Plan

## 2012-12-04 NOTE — Patient Instructions (Signed)
Your physician recommends that you schedule a follow-up appointment in: AS NEEDED  Your physician recommends that you continue on your current medications as directed. Please refer to the Current Medication list given to you today.  

## 2012-12-04 NOTE — Assessment & Plan Note (Signed)
Benign resolved likely related to reaction to some fish she ate with GI upset.  Offerred her f/u if palpitations recur

## 2013-01-07 DIAGNOSIS — M779 Enthesopathy, unspecified: Secondary | ICD-10-CM | POA: Diagnosis not present

## 2013-01-08 ENCOUNTER — Ambulatory Visit: Payer: Medicare Other | Admitting: Cardiovascular Disease

## 2013-01-11 DIAGNOSIS — L570 Actinic keratosis: Secondary | ICD-10-CM | POA: Diagnosis not present

## 2013-01-11 DIAGNOSIS — L821 Other seborrheic keratosis: Secondary | ICD-10-CM | POA: Diagnosis not present

## 2013-01-11 DIAGNOSIS — Q828 Other specified congenital malformations of skin: Secondary | ICD-10-CM | POA: Diagnosis not present

## 2013-02-05 ENCOUNTER — Ambulatory Visit (INDEPENDENT_AMBULATORY_CARE_PROVIDER_SITE_OTHER): Payer: Medicare Other | Admitting: Family Medicine

## 2013-02-05 VITALS — BP 128/76 | HR 77 | Temp 98.0°F | Resp 17 | Ht 60.0 in | Wt 106.0 lb

## 2013-02-05 DIAGNOSIS — J069 Acute upper respiratory infection, unspecified: Secondary | ICD-10-CM

## 2013-02-05 DIAGNOSIS — J029 Acute pharyngitis, unspecified: Secondary | ICD-10-CM | POA: Diagnosis not present

## 2013-02-05 LAB — POCT RAPID STREP A (OFFICE): Rapid Strep A Screen: NEGATIVE

## 2013-02-05 NOTE — Patient Instructions (Addendum)
Fluids, rest as able.  Lozenges, and symptomatic care as below.  Saline nasal spray as needed for congestion. Return to the clinic or go to the nearest emergency room if any of your symptoms worsen or new symptoms occur. Upper Respiratory Infection, Adult An upper respiratory infection (URI) is also sometimes known as the common cold. The upper respiratory tract includes the nose, sinuses, throat, trachea, and bronchi. Bronchi are the airways leading to the lungs. Most people improve within 1 week, but symptoms can last up to 2 weeks. A residual cough may last even longer.  CAUSES Many different viruses can infect the tissues lining the upper respiratory tract. The tissues become irritated and inflamed and often become very moist. Mucus production is also common. A cold is contagious. You can easily spread the virus to others by oral contact. This includes kissing, sharing a glass, coughing, or sneezing. Touching your mouth or nose and then touching a surface, which is then touched by another person, can also spread the virus. SYMPTOMS  Symptoms typically develop 1 to 3 days after you come in contact with a cold virus. Symptoms vary from person to person. They may include:  Runny nose.  Sneezing.  Nasal congestion.  Sinus irritation.  Sore throat.  Loss of voice (laryngitis).  Cough.  Fatigue.  Muscle aches.  Loss of appetite.  Headache.  Low-grade fever. DIAGNOSIS  You might diagnose your own cold based on familiar symptoms, since most people get a cold 2 to 3 times a year. Your caregiver can confirm this based on your exam. Most importantly, your caregiver can check that your symptoms are not due to another disease such as strep throat, sinusitis, pneumonia, asthma, or epiglottitis. Blood tests, throat tests, and X-rays are not necessary to diagnose a common cold, but they may sometimes be helpful in excluding other more serious diseases. Your caregiver will decide if any further  tests are required. RISKS AND COMPLICATIONS  You may be at risk for a more severe case of the common cold if you smoke cigarettes, have chronic heart disease (such as heart failure) or lung disease (such as asthma), or if you have a weakened immune system. The very young and very old are also at risk for more serious infections. Bacterial sinusitis, middle ear infections, and bacterial pneumonia can complicate the common cold. The common cold can worsen asthma and chronic obstructive pulmonary disease (COPD). Sometimes, these complications can require emergency medical care and may be life-threatening. PREVENTION  The best way to protect against getting a cold is to practice good hygiene. Avoid oral or hand contact with people with cold symptoms. Wash your hands often if contact occurs. There is no clear evidence that vitamin C, vitamin E, echinacea, or exercise reduces the chance of developing a cold. However, it is always recommended to get plenty of rest and practice good nutrition. TREATMENT  Treatment is directed at relieving symptoms. There is no cure. Antibiotics are not effective, because the infection is caused by a virus, not by bacteria. Treatment may include:  Increased fluid intake. Sports drinks offer valuable electrolytes, sugars, and fluids.  Breathing heated mist or steam (vaporizer or shower).  Eating chicken soup or other clear broths, and maintaining good nutrition.  Getting plenty of rest.  Using gargles or lozenges for comfort.  Controlling fevers with ibuprofen or acetaminophen as directed by your caregiver.  Increasing usage of your inhaler if you have asthma. Zinc gel and zinc lozenges, taken in the first 24 hours  of the common cold, can shorten the duration and lessen the severity of symptoms. Pain medicines may help with fever, muscle aches, and throat pain. A variety of non-prescription medicines are available to treat congestion and runny nose. Your caregiver can  make recommendations and may suggest nasal or lung inhalers for other symptoms.  HOME CARE INSTRUCTIONS   Only take over-the-counter or prescription medicines for pain, discomfort, or fever as directed by your caregiver.  Use a warm mist humidifier or inhale steam from a shower to increase air moisture. This may keep secretions moist and make it easier to breathe.  Drink enough water and fluids to keep your urine clear or pale yellow.  Rest as needed.  Return to work when your temperature has returned to normal or as your caregiver advises. You may need to stay home longer to avoid infecting others. You can also use a face mask and careful hand washing to prevent spread of the virus. SEEK MEDICAL CARE IF:   After the first few days, you feel you are getting worse rather than better.  You need your caregiver's advice about medicines to control symptoms.  You develop chills, worsening shortness of breath, or brown or red sputum. These may be signs of pneumonia.  You develop yellow or brown nasal discharge or pain in the face, especially when you bend forward. These may be signs of sinusitis.  You develop a fever, swollen neck glands, pain with swallowing, or white areas in the back of your throat. These may be signs of strep throat. SEEK IMMEDIATE MEDICAL CARE IF:   You have a fever.  You develop severe or persistent headache, ear pain, sinus pain, or chest pain.  You develop wheezing, a prolonged cough, cough up blood, or have a change in your usual mucus (if you have chronic lung disease).  You develop sore muscles or a stiff neck. Document Released: 04/23/2001 Document Revised: 01/20/2012 Document Reviewed: 03/01/2011 [facility] Patient Information 2013 [place], [place].

## 2013-02-05 NOTE — Progress Notes (Signed)
Subjective:    Patient ID: Kelli Churn, female    DOB: 1933-05-21, 79 y.o.   MRN: [id number]  HPI INGA NOLLER is a 79 y.o. female Had stomach virus few weeks ago - this has resolved.  Sore throat - past 2-3 days. No known fever, nasal congestion, runny nose, clear nasal discharge.   No known sick contacts.   Tx: salt water gargles.    Review of Systems  Constitutional: Negative for fever and chills.  HENT: Positive for congestion, sore throat and rhinorrhea. Negative for ear pain, trouble swallowing, voice change and postnasal drip.   Respiratory: Positive for cough (minimal ). Negative for shortness of breath.        Objective:   Physical Exam  Vitals reviewed. Constitutional: She is oriented to person, place, and time. She appears well-developed and well-nourished. No distress.  HENT:  Head: Normocephalic and atraumatic.  Right Ear: Hearing, tympanic membrane, external ear and ear canal normal.  Left Ear: Hearing, tympanic membrane, external ear and ear canal normal.  Nose: Nose normal.  Mouth/Throat: Oropharynx is clear and moist. No oropharyngeal exudate.  Eyes: Conjunctivae and EOM are normal. Pupils are equal, round, and reactive to light.  Cardiovascular: Normal rate, regular rhythm, normal heart sounds and intact distal pulses.   No murmur heard. Pulmonary/Chest: Effort normal and breath sounds normal. No respiratory distress. She has no wheezes. She has no rhonchi.  Neurological: She is alert and oriented to person, place, and time.  Skin: Skin is warm and dry. No rash noted.  Psychiatric: She has a normal mood and affect. Her behavior is normal.    Results for orders placed in visit on 02/05/13  POCT RAPID STREP A (OFFICE)      Result Value Range   Rapid Strep A Screen Negative  Negative         Assessment & Plan:  MIKESHA MIGLIACCIO is a 79 y.o. female  Suspected URi/viral pharyngitis.  Sx care reviewed as below, lozenges, fluids, rest, rtc  precautions discussed.   Patient Instructions  Fluids, rest as able.  Lozenges, and symptomatic care as below.  Saline nasal spray as needed for congestion. Return to the clinic or go to the nearest emergency room if any of your symptoms worsen or new symptoms occur. Upper Respiratory Infection, Adult An upper respiratory infection (URI) is also sometimes known as the common cold. The upper respiratory tract includes the nose, sinuses, throat, trachea, and bronchi. Bronchi are the airways leading to the lungs. Most people improve within 1 week, but symptoms can last up to 2 weeks. A residual cough may last even longer.  CAUSES Many different viruses can infect the tissues lining the upper respiratory tract. The tissues become irritated and inflamed and often become very moist. Mucus production is also common. A cold is contagious. You can easily spread the virus to others by oral contact. This includes kissing, sharing a glass, coughing, or sneezing. Touching your mouth or nose and then touching a surface, which is then touched by another person, can also spread the virus. SYMPTOMS  Symptoms typically develop 1 to 3 days after you come in contact with a cold virus. Symptoms vary from person to person. They may include:  Runny nose.  Sneezing.  Nasal congestion.  Sinus irritation.  Sore throat.  Loss of voice (laryngitis).  Cough.  Fatigue.  Muscle aches.  Loss of appetite.  Headache.  Low-grade fever. DIAGNOSIS  You might diagnose your own cold based on  familiar symptoms, since most people get a cold 2 to 3 times a year. Your caregiver can confirm this based on your exam. Most importantly, your caregiver can check that your symptoms are not due to another disease such as strep throat, sinusitis, pneumonia, asthma, or epiglottitis. Blood tests, throat tests, and X-rays are not necessary to diagnose a common cold, but they may sometimes be helpful in excluding other more serious  diseases. Your caregiver will decide if any further tests are required. RISKS AND COMPLICATIONS  You may be at risk for a more severe case of the common cold if you smoke cigarettes, have chronic heart disease (such as heart failure) or lung disease (such as asthma), or if you have a weakened immune system. The very young and very old are also at risk for more serious infections. Bacterial sinusitis, middle ear infections, and bacterial pneumonia can complicate the common cold. The common cold can worsen asthma and chronic obstructive pulmonary disease (COPD). Sometimes, these complications can require emergency medical care and may be life-threatening. PREVENTION  The best way to protect against getting a cold is to practice good hygiene. Avoid oral or hand contact with people with cold symptoms. Wash your hands often if contact occurs. There is no clear evidence that vitamin C, vitamin E, echinacea, or exercise reduces the chance of developing a cold. However, it is always recommended to get plenty of rest and practice good nutrition. TREATMENT  Treatment is directed at relieving symptoms. There is no cure. Antibiotics are not effective, because the infection is caused by a virus, not by bacteria. Treatment may include:  Increased fluid intake. Sports drinks offer valuable electrolytes, sugars, and fluids.  Breathing heated mist or steam (vaporizer or shower).  Eating chicken soup or other clear broths, and maintaining good nutrition.  Getting plenty of rest.  Using gargles or lozenges for comfort.  Controlling fevers with ibuprofen or acetaminophen as directed by your caregiver.  Increasing usage of your inhaler if you have asthma. Zinc gel and zinc lozenges, taken in the first 24 hours of the common cold, can shorten the duration and lessen the severity of symptoms. Pain medicines may help with fever, muscle aches, and throat pain. A variety of non-prescription medicines are available to  treat congestion and runny nose. Your caregiver can make recommendations and may suggest nasal or lung inhalers for other symptoms.  HOME CARE INSTRUCTIONS   Only take over-the-counter or prescription medicines for pain, discomfort, or fever as directed by your caregiver.  Use a warm mist humidifier or inhale steam from a shower to increase air moisture. This may keep secretions moist and make it easier to breathe.  Drink enough water and fluids to keep your urine clear or pale yellow.  Rest as needed.  Return to work when your temperature has returned to normal or as your caregiver advises. You may need to stay home longer to avoid infecting others. You can also use a face mask and careful hand washing to prevent spread of the virus. SEEK MEDICAL CARE IF:   After the first few days, you feel you are getting worse rather than better.  You need your caregiver's advice about medicines to control symptoms.  You develop chills, worsening shortness of breath, or brown or red sputum. These may be signs of pneumonia.  You develop yellow or brown nasal discharge or pain in the face, especially when you bend forward. These may be signs of sinusitis.  You develop a fever, swollen neck  glands, pain with swallowing, or white areas in the back of your throat. These may be signs of strep throat. SEEK IMMEDIATE MEDICAL CARE IF:   You have a fever.  You develop severe or persistent headache, ear pain, sinus pain, or chest pain.  You develop wheezing, a prolonged cough, cough up blood, or have a change in your usual mucus (if you have chronic lung disease).  You develop sore muscles or a stiff neck. Document Released: 04/23/2001 Document Revised: 01/20/2012 Document Reviewed: 03/01/2011 [facility] Patient Information 2013 [place], [place].

## 2013-04-29 DIAGNOSIS — H00019 Hordeolum externum unspecified eye, unspecified eyelid: Secondary | ICD-10-CM | POA: Diagnosis not present

## 2013-06-16 ENCOUNTER — Other Ambulatory Visit: Payer: Self-pay

## 2013-06-29 DIAGNOSIS — Z124 Encounter for screening for malignant neoplasm of cervix: Secondary | ICD-10-CM | POA: Diagnosis not present

## 2013-06-29 DIAGNOSIS — Z130 Encounter for screening for diseases of the blood and blood-forming organs and certain disorders involving the immune mechanism: Secondary | ICD-10-CM | POA: Diagnosis not present

## 2013-06-29 DIAGNOSIS — Z1231 Encounter for screening mammogram for malignant neoplasm of breast: Secondary | ICD-10-CM | POA: Diagnosis not present

## 2013-07-30 DIAGNOSIS — Z23 Encounter for immunization: Secondary | ICD-10-CM | POA: Diagnosis not present

## 2013-08-02 DIAGNOSIS — Z13820 Encounter for screening for osteoporosis: Secondary | ICD-10-CM | POA: Diagnosis not present

## 2013-08-02 DIAGNOSIS — M810 Age-related osteoporosis without current pathological fracture: Secondary | ICD-10-CM | POA: Diagnosis not present

## 2013-08-18 DIAGNOSIS — L570 Actinic keratosis: Secondary | ICD-10-CM | POA: Diagnosis not present

## 2013-08-18 DIAGNOSIS — L678 Other hair color and hair shaft abnormalities: Secondary | ICD-10-CM | POA: Diagnosis not present

## 2013-08-18 DIAGNOSIS — L821 Other seborrheic keratosis: Secondary | ICD-10-CM | POA: Diagnosis not present

## 2013-08-18 DIAGNOSIS — L738 Other specified follicular disorders: Secondary | ICD-10-CM | POA: Diagnosis not present

## 2013-09-02 DIAGNOSIS — M545 Low back pain, unspecified: Secondary | ICD-10-CM | POA: Diagnosis not present

## 2013-09-02 DIAGNOSIS — M47817 Spondylosis without myelopathy or radiculopathy, lumbosacral region: Secondary | ICD-10-CM | POA: Diagnosis not present

## 2013-09-02 DIAGNOSIS — M4120 Other idiopathic scoliosis, site unspecified: Secondary | ICD-10-CM | POA: Diagnosis not present

## 2013-09-02 DIAGNOSIS — S335XXA Sprain of ligaments of lumbar spine, initial encounter: Secondary | ICD-10-CM | POA: Diagnosis not present

## 2013-09-16 ENCOUNTER — Other Ambulatory Visit: Payer: Self-pay

## 2013-10-14 DIAGNOSIS — L253 Unspecified contact dermatitis due to other chemical products: Secondary | ICD-10-CM | POA: Diagnosis not present

## 2013-12-02 DIAGNOSIS — H43819 Vitreous degeneration, unspecified eye: Secondary | ICD-10-CM | POA: Diagnosis not present

## 2014-01-10 ENCOUNTER — Encounter: Payer: Self-pay | Admitting: Cardiovascular Disease

## 2014-01-10 ENCOUNTER — Ambulatory Visit (INDEPENDENT_AMBULATORY_CARE_PROVIDER_SITE_OTHER): Payer: Medicare Other | Admitting: Cardiovascular Disease

## 2014-01-10 VITALS — BP 142/76 | HR 67 | Ht 60.0 in | Wt 103.0 lb

## 2014-01-10 DIAGNOSIS — R002 Palpitations: Secondary | ICD-10-CM

## 2014-01-10 DIAGNOSIS — E7800 Pure hypercholesterolemia, unspecified: Secondary | ICD-10-CM | POA: Diagnosis not present

## 2014-01-10 NOTE — Progress Notes (Signed)
    HPI:  80 year old woman presenting for followup evaluation. She is here for evaluation of strong family history of CAD. She has 6 siblings and several have had either bypass surgery or PCI. Her mother had a stroke. She has no personal history of heart disease. In fact, the patient is physically active and very health conscious. She has no symptoms with exertion. She climbs stairs and goes for regular walks without exertional chest pain or tightness, back pain, or shortness of breath. She denies orthopnea, PND, or leg swelling. She has been diagnosed with hyperlipidemia but has not been inclined to take statin drugs over time. She is managed with fish oil and niacin. Last lipids from January 2013 showed cholesterol 228, triglycerides 76, HDL 54, and LDL 159.  Outpatient Encounter Prescriptions as of 01/10/2014  Medication Sig  . aspirin 81 MG tablet Take 81 mg by mouth daily.    . Calcium Carbonate-Vitamin D (CALTRATE 600+D) 600-400 MG-UNIT per tablet Take 1 tablet by mouth 2 (two) times daily.   . cholecalciferol (VITAMIN D) 1000 UNITS tablet Take 2,000 Units by mouth daily.   . niacin 500 MG tablet Take 500 mg by mouth daily with breakfast.    . Omega-3 Fatty Acids (FISH OIL) 300 MG CAPS Take 1 capsule by mouth daily.    . [DISCONTINUED] Multiple Vitamin (MULTIVITAMINS PO) Take 1 tablet by mouth daily.      Allergies  Allergen Reactions  . Hydrocodone     REACTION: nausea  . Risedronate Sodium     REACTION: INTOL to Actonel    Past Medical History  Diagnosis Date  . Hypercholesterolemia   . Hx of colonic polyps   . Neck pain   . Lumbar back pain   . Osteoporosis     ROS: Negative except as per HPI  BP 142/76  Pulse 67  Ht 5' (1.524 m)  Wt 103 lb (46.72 kg)  BMI 20.12 kg/m2  PHYSICAL EXAM: Pt is alert and oriented, NAD HEENT: normal Neck: JVP - normal, carotids 2+= without bruits Lungs: CTA bilaterally CV: RRR without murmur or gallop Abd: soft, NT, Positive BS, no  hepatomegaly Ext: no C/C/E, distal pulses intact and equal Skin: warm/dry no rash  EKG:  Normal sinus rhythm 67 beats per minute, nonspecific ST abnormality, otherwise within normal limits.  ASSESSMENT AND PLAN: Hyperlipidemia. Lipids from 2 years ago reviewed. The patient has been taking niacin and fish oil. Will repeat and referred to the lipid clinic for further management. She could be considered for a non-statin drug such as zetia depending on results of her lipid panel.   For follow-up I will see her back in one year. Fortunately she is having no ischemic symptoms at this time and we will continue to focus on prevention in this healthy 80 year-old woman.  Sherren Mocha 01/10/2014 12:24 PM

## 2014-01-10 NOTE — Patient Instructions (Addendum)
Your physician recommends that you return for lab work on Thursday March 5 anytime between 7:30 am and 5:30 pm.  You will need to fast for this appointment (nothing to eat or drink after midnight the night before)  For cholesterol and liver panel  You have been referred to [facility] at [address] Your appointment is scheduled for Thursday March 12 at 10:30 am  Your physician wants you to follow-up in: 1 year with Dr. Burt Knack.  You will receive a reminder letter in the mail two months in advance. If you don't receive a letter, please call our office to schedule the follow-up appointment.

## 2014-01-11 DIAGNOSIS — L570 Actinic keratosis: Secondary | ICD-10-CM | POA: Diagnosis not present

## 2014-01-11 DIAGNOSIS — L719 Rosacea, unspecified: Secondary | ICD-10-CM | POA: Diagnosis not present

## 2014-01-11 DIAGNOSIS — L821 Other seborrheic keratosis: Secondary | ICD-10-CM | POA: Diagnosis not present

## 2014-01-13 ENCOUNTER — Other Ambulatory Visit (INDEPENDENT_AMBULATORY_CARE_PROVIDER_SITE_OTHER): Payer: Medicare Other

## 2014-01-13 DIAGNOSIS — E7800 Pure hypercholesterolemia, unspecified: Secondary | ICD-10-CM

## 2014-01-13 LAB — LIPID PANEL
Cholesterol: 202 mg/dL — ABNORMAL HIGH (ref 0–200)
HDL: 49.4 mg/dL (ref >39.00)
LDL Cholesterol: 136 mg/dL — ABNORMAL HIGH (ref 0–99)
Total CHOL/HDL Ratio: 4
Triglycerides: 84 mg/dL (ref 0.0–149.0)
VLDL: 16.8 mg/dL (ref 0.0–40.0)

## 2014-01-13 LAB — HEPATIC FUNCTION PANEL
ALT: 14 U/L (ref 0–35)
AST: 18 U/L (ref 0–37)
Albumin: 3.9 g/dL (ref 3.5–5.2)
Alkaline Phosphatase: 60 U/L (ref 39–117)
Bilirubin, Direct: 0.1 mg/dL (ref 0.0–0.3)
Total Bilirubin: 1 mg/dL (ref 0.3–1.2)
Total Protein: 6.8 g/dL (ref 6.0–8.3)

## 2014-01-20 ENCOUNTER — Encounter: Payer: Self-pay | Admitting: Pharmacist

## 2014-01-20 ENCOUNTER — Ambulatory Visit (INDEPENDENT_AMBULATORY_CARE_PROVIDER_SITE_OTHER): Payer: Medicare Other | Admitting: Pharmacist

## 2014-01-20 VITALS — Ht 60.0 in | Wt 105.2 lb

## 2014-01-20 DIAGNOSIS — E7800 Pure hypercholesterolemia, unspecified: Secondary | ICD-10-CM | POA: Diagnosis not present

## 2014-01-20 DIAGNOSIS — E785 Hyperlipidemia, unspecified: Secondary | ICD-10-CM

## 2014-01-20 NOTE — Progress Notes (Signed)
Mrs. Bayon is referred by Dr. Burt Knack for management of hyperlipidemia. She has a strong family history for CAD (6 siblings, several with hx of bypass surgery or PCI, mother had a stroke) but has no personal history of heart disease. She is physically active and health conscious. She has been a member of [employer] for 40+ years, weighs in once/month. Weight is pretty stable at 105 lbs. She is currently treated with OTC fish oil (vegetarian omega-3) and Niacin (flush free, 500mg  daily). She has been reluctant to start a statin in the past.   Diet: Breakfast: half a banana Lunch: [employer] - cup of jello with a side/veggie (mixed vegetables, mashed potatoes, corn) and a cup of coffee Dinner: cup of soup, sweet potato, may split a burger with her husband, and a glass of wine Snacks: loves peanut butter; takes morning and evening calcium with a spoonful of peanut butter -overall not a big eater, doesn't eat much meat   Exercise: Active around her house, climbs stairs (2 stories plus a finished basement). Goes to the [facility] and walks 45 min on the tread mill (goal 2-3x/wk). Wears a pedometer, goal 10,000 steps/day  Current Outpatient Prescriptions  Medication Sig Dispense Refill  . aspirin 81 MG tablet Take 81 mg by mouth daily.        . Calcium Carbonate-Vitamin D (CALTRATE 600+D) 600-400 MG-UNIT per tablet Take 1 tablet by mouth 2 (two) times daily.       . cholecalciferol (VITAMIN D) 1000 UNITS tablet Take 2,000 Units by mouth daily.       . niacin 500 MG tablet Take 1 tablet (500 mg total) by mouth 2 (two) times daily with a meal.  30 tablet  0  . Omega-3 Fatty Acids (FISH OIL) 300 MG CAPS Take 1 capsule by mouth daily.         No current facility-administered medications for this visit.   Allergies  Allergen Reactions  . Risedronate Sodium     REACTION: INTOL to Actonel  . Hydrocodone Nausea Only    REACTION: nausea

## 2014-01-20 NOTE — Assessment & Plan Note (Addendum)
Patient is currently taking OTC fish oil and niacin for hyperlipidemia (primary prevention). Her LDL is 136, down from 158 two years ago (goal <130), TC is 202, down from 228. Given patient's largest risk factors being age and family history, goal LDL <130, which is moving in the right direction. Patient inclined not to start a statin. At this time, we will increase Niacin to 500mg  BID. Of note, patient is taking "flush free" Niacin, which may not be as effective as an immediate release Niacin Rx - did not address today given improved LDL but will continue to observe and address if unimproved. Reinforced diet and exercise 2-3x/wk to maintain weight around 105lb.  Follow up in 3 months.  Patient seen by Fredna Dow, PharmD resident and Sherie Don, PharmD student

## 2014-01-20 NOTE — Patient Instructions (Signed)
Thanks for visiting [place] today, it was good to meet you.  Goals:  - Continue with your current diet, maintaining weight of 105-106 lbs - Increase niacin to 500 mg TWICE daily - Continue moderate exercise 3 times a week   We will recheck your labs and see you in 3 months.

## 2014-03-02 ENCOUNTER — Ambulatory Visit: Payer: Medicare Other

## 2014-03-02 ENCOUNTER — Ambulatory Visit (INDEPENDENT_AMBULATORY_CARE_PROVIDER_SITE_OTHER): Payer: Medicare Other | Admitting: Emergency Medicine

## 2014-03-02 VITALS — BP 140/80 | HR 67 | Temp 97.8°F | Resp 16 | Ht 60.0 in | Wt 104.4 lb

## 2014-03-02 DIAGNOSIS — J209 Acute bronchitis, unspecified: Secondary | ICD-10-CM | POA: Diagnosis not present

## 2014-03-02 DIAGNOSIS — R05 Cough: Secondary | ICD-10-CM | POA: Diagnosis not present

## 2014-03-02 DIAGNOSIS — R059 Cough, unspecified: Secondary | ICD-10-CM

## 2014-03-02 DIAGNOSIS — J069 Acute upper respiratory infection, unspecified: Secondary | ICD-10-CM | POA: Diagnosis not present

## 2014-03-02 LAB — POCT CBC
Granulocyte percent: 55.1 %G (ref 37–80)
HCT, POC: 42.9 % (ref 37.7–47.9)
Hemoglobin: 13.4 g/dL (ref 12.2–16.2)
Lymph, poc: 1.7 (ref 0.6–3.4)
MCH, POC: 30.7 pg (ref 27–31.2)
MCHC: 31.2 g/dL — AB (ref 31.8–35.4)
MCV: 98.4 fL — AB (ref 80–97)
MID (cbc): 0.6 (ref 0–0.9)
MPV: 9.4 fL (ref 0–99.8)
POC Granulocyte: 2.9 (ref 2–6.9)
POC LYMPH PERCENT: 32.8 %L (ref 10–50)
POC MID %: 12.1 %M — AB (ref 0–12)
Platelet Count, POC: 260 10*3/uL (ref 142–424)
RBC: 4.36 M/uL (ref 4.04–5.48)
RDW, POC: 13.3 %
WBC: 5.2 10*3/uL (ref 4.6–10.2)

## 2014-03-02 LAB — POCT RAPID STREP A (OFFICE): Rapid Strep A Screen: NEGATIVE

## 2014-03-02 MED ORDER — BENZONATATE 100 MG PO CAPS: 100.0000 mg | ORAL_CAPSULE | Freq: Three times a day (TID) | ORAL | Status: DC | PRN

## 2014-03-02 MED ORDER — AZITHROMYCIN 250 MG PO TABS: ORAL_TABLET | ORAL | Status: DC

## 2014-03-02 NOTE — Progress Notes (Signed)
   This chart was scribed for Remo Lipps A. Everlene Farrier, MD by Marcha Dutton, ED Scribe. This patient was seen in room 8 and the patient's care was started at 9:18 AM.  Subjective:    Patient ID: [name], female    DOB: [birth year], 80 y.o.   MRN: [id number]  HPI HPI Comments: [name] is a 80 y.o. female who presents to the [facility] complaining of a gradually worsening cough productive of green sputum over the last week. She reports associated sore throat. Pt denies fever and sick contacts. Pt reports she received a flu shot in September or October from [facility]. She states she does not smoke.  Review of Systems  Constitutional: Negative for fever.  HENT: Positive for sore throat.   Respiratory: Positive for cough.        Objective:   Physical Exam  Nursing note and vitals reviewed. CONSTITUTIONAL: Well developed/well nourished HEAD: Normocephalic/atraumatic EYES: EOMI/PERRL ENMT: Mucous membranes moist NECK: supple no meningeal signs SPINE:entire spine nontender CV: S1/S2 noted, no murmurs/rubs/gallops noted LUNGS: Lungs are clear to auscultation bilaterally, no apparent distress ABDOMEN: soft, nontender, no rebound or guarding GU:no cva tenderness NEURO: Pt is awake/alert, moves all extremitiesx4 EXTREMITIES: pulses normal, full ROM SKIN: warm, color normal PSYCH: no abnormalities    [facility] primary xray reading by Dr Everlene Farrier patient has increased AP diameter no pulmonic infiltrates Results for orders placed in visit on 03/02/14  POCT CBC      Result Value Ref Range   WBC 5.2  4.6 - 10.2 K/uL   Lymph, poc 1.7  0.6 - 3.4   POC LYMPH PERCENT 32.8  10 - 50 %L   MID (cbc) 0.6  0 - 0.9   POC MID % 12.1 (*) 0 - 12 %M   POC Granulocyte 2.9  2 - 6.9   Granulocyte percent 55.1  37 - 80 %G   RBC 4.36  4.04 - 5.48 M/uL   Hemoglobin 13.4  12.2 - 16.2 g/dL   HCT, POC 42.9  37.7 - 47.9 %   MCV 98.4 (*) 80 - 97 fL   MCH, POC 30.7  27 - 31.2 pg   MCHC 31.2  (*) 31.8 - 35.4 g/dL   RDW, POC 13.3     Platelet Count, POC 260  142 - 424 K/uL   MPV 9.4  0 - 99.8 fL  POCT RAPID STREP A (OFFICE)      Result Value Ref Range   Rapid Strep A Screen Negative  Negative   Assessment & Plan:  This appears to be a viral type illness. We'll treat with Tessalon Perles and Z-Pak because of her greenish phlegm

## 2014-03-04 LAB — CULTURE, GROUP A STREP: Organism ID, Bacteria: NORMAL

## 2014-03-23 ENCOUNTER — Telehealth: Payer: Self-pay

## 2014-03-23 DIAGNOSIS — J439 Emphysema, unspecified: Secondary | ICD-10-CM

## 2014-03-23 NOTE — Telephone Encounter (Signed)
Pt has been seeing Dr. Lenna Gilford (pulm) for years and now Dr. Lenna Gilford is retiring. Pt needs a new referral to another Pulmonologist. Can we do this please. Thanks. (see CXR note)

## 2014-03-23 NOTE — Telephone Encounter (Signed)
Patient walked into 102 today and states that she's left several messages but has yet to receive a call back. States she has left messages regarding a referral, lab results, and an appointment. Please return patient's call. Thank you.

## 2014-03-24 NOTE — Telephone Encounter (Signed)
Orders Placed This Encounter  Procedures   Ambulatory referral to Pulmonology    Referral Priority:   Routine    Referral Type:   Consultation    Referral Reason:   Specialty Services Required    Requested Specialty:   Pulmonary Disease    Number of Visits Requested:   1    

## 2014-03-25 NOTE — Telephone Encounter (Signed)
Spoke to pt, she is aware 

## 2014-04-26 ENCOUNTER — Other Ambulatory Visit (INDEPENDENT_AMBULATORY_CARE_PROVIDER_SITE_OTHER): Payer: Medicare Other

## 2014-04-26 DIAGNOSIS — E785 Hyperlipidemia, unspecified: Secondary | ICD-10-CM

## 2014-04-26 LAB — HEPATIC FUNCTION PANEL
ALT: 14 U/L (ref 0–35)
AST: 22 U/L (ref 0–37)
Albumin: 4.1 g/dL (ref 3.5–5.2)
Alkaline Phosphatase: 56 U/L (ref 39–117)
Bilirubin, Direct: 0 mg/dL (ref 0.0–0.3)
Total Bilirubin: 0.5 mg/dL (ref 0.2–1.2)
Total Protein: 7 g/dL (ref 6.0–8.3)

## 2014-04-26 LAB — LIPID PANEL
Cholesterol: 223 mg/dL — ABNORMAL HIGH (ref 0–200)
HDL: 55 mg/dL (ref >39.00)
LDL Cholesterol: 151 mg/dL — ABNORMAL HIGH (ref 0–99)
NonHDL: 168
Total CHOL/HDL Ratio: 4
Triglycerides: 83 mg/dL (ref 0.0–149.0)
VLDL: 16.6 mg/dL (ref 0.0–40.0)

## 2014-04-27 ENCOUNTER — Other Ambulatory Visit: Payer: Medicare Other

## 2014-04-28 ENCOUNTER — Ambulatory Visit (INDEPENDENT_AMBULATORY_CARE_PROVIDER_SITE_OTHER): Payer: Medicare Other | Admitting: Pharmacist

## 2014-04-28 VITALS — Wt 102.0 lb

## 2014-04-28 DIAGNOSIS — E785 Hyperlipidemia, unspecified: Secondary | ICD-10-CM | POA: Diagnosis not present

## 2014-04-28 DIAGNOSIS — Z79899 Other long term (current) drug therapy: Secondary | ICD-10-CM | POA: Diagnosis not present

## 2014-04-28 DIAGNOSIS — E7800 Pure hypercholesterolemia, unspecified: Secondary | ICD-10-CM | POA: Diagnosis not present

## 2014-04-28 NOTE — Patient Instructions (Signed)
1.  Stop flush free niacin. 2.  Start niacin 500 mg once daily in the evening time (Regular niacin - found at [facility]).  Don't use Slo-niacin, flush free niacin, non-flush niacin. 3.  Take your aspirin at least 1-2 hours prior to taking your niacin to help reduce likelihood of flushing. 4.  Eat either a few Triscuit crackers or some applesauce along with niacin as it has pectin in it, and can reduce incidence of flushing as well. 5.  In 4 weeks, increase to niacin 1000 mg daily (500 mg AM / PM or 1000 mg in PM).   6.  If flushing becomes an issue despite doing these things, call [facility] [phone], and may consider prescription niaspan. 7.  Recheck cholesterol / liver in 3 months (07/25/14 fasting lab), and see Ysidro Evert 07/28/14 at 10:30 am

## 2014-04-28 NOTE — Assessment & Plan Note (Addendum)
Does not seem necessary to initiate statin at this time given her age, healthy lifestyle, and lack of personal CAD.  Would like to further lower LDL and non-HDL given her family history, and niacin is a reasonable option, however needs to be on the right formulation.  Since flush free niacin has very low levels of inositol niacinamide in it (very little active niacin), it is likely not doing much for cholesterol control.  Explained to patient the different forms of niacin, and she agrees to avoid slo-niacin, flush free, and non-flush niacin.  Will have her instead change to immediate release niacin from [facility], and if she can't tolerate this, could try prescription niaspan.  She is agreeable to this.  Will make sure she uses aspirin, and pectin foods appropriately to try and minimize flushing.  Will try to get her up to 1000 mg daily prior to her next lipid panel in 3 months. Plan: 1.  Stop flush free niacin. 2.  Start niacin 500 mg once daily in the evening time (Regular niacin - found at [facility]).  Don't use Slo-niacin, flush free niacin, non-flush niacin. 3.  Take your aspirin at least 1-2 hours prior to taking your niacin to help reduce likelihood of flushing. 4.  Eat either a few Triscuit crackers or some applesauce along with niacin as it has pectin in it, and can reduce incidence of flushing as well. 5.  In 4 weeks, increase to niacin 1000 mg daily (500 mg AM / PM or 1000 mg in PM).   6.  If flushing becomes an issue despite doing these things, call [facility] [phone], and may consider prescription niaspan. 7.  Recheck cholesterol / liver in 3 months (07/25/14 fasting lab), and see Ysidro Evert 07/28/14 at 10:30 am

## 2014-04-28 NOTE — Progress Notes (Signed)
Patient is here for 3 month follow-up.  [name] was referred by Dr. Burt Knack for management of hyperlipidemia. She has a strong family history for CAD (6 siblings, several with hx of bypass surgery or PCI, mother had a stroke) but has no personal history of heart disease. She is physically active and health conscious. She has been a member of [employer] for 40+ years, weighs in once/month. Weight is pretty stable at 102-105 lbs. She is currently treated with OTC fish oil (vegetarian omega-3) and Niacin (flush free, 500mg  bid). She has been reluctant to start a statin in the past.   She denies any flushing from her flush free niacin, but tells me she has a friend who flushes on niacin.  Patient states she's never tried a different form of niacin before.  LDL goal < 130 mg/dL  Meds:  Flush free niacin 500 mg bid, fish oil 300 mg qd  Diet: Breakfast: half a banana Lunch: [employer] - cup of jello with a side/veggie (mixed vegetables, mashed potatoes, corn) and a cup of coffee Dinner: cup of soup, sweet potato, may split a burger with her husband, and a glass of wine Snacks: loves peanut butter; takes morning and evening calcium with a spoonful of peanut butter -overall not a big eater, doesn't eat much meat   Exercise: Active around her house, climbs stairs (2 stories plus a finished basement). Goes to the [facility] and walks 45 min on the tread mill (goal 2-3x/wk). Wears a pedometer, goal 10,000 steps/day  Labs: 04/2014:  TC 223, TG 83, HDL 55, LDL 151, LFTs normal (flush free niacin 1000 mg daily)  Current Outpatient Prescriptions  Medication Sig Dispense Refill  . aspirin 81 MG tablet Take 81 mg by mouth daily.        . benzonatate (TESSALON) 100 MG capsule Take 1-2 capsules (100-200 mg total) by mouth 3 (three) times daily as needed for cough.  40 capsule  0  . Calcium Carbonate-Vitamin D (CALTRATE 600+D) 600-400 MG-UNIT per tablet Take 1 tablet by mouth 2 (two) times daily.       .  cholecalciferol (VITAMIN D) 1000 UNITS tablet Take 2,000 Units by mouth daily.       . niacin 500 MG tablet Take 1 tablet (500 mg total) by mouth 2 (two) times daily with a meal.  30 tablet  0  . Omega-3 Fatty Acids (FISH OIL) 300 MG CAPS Take 1 capsule by mouth daily.         No current facility-administered medications for this visit.   Allergies  Allergen Reactions  . Risedronate Sodium     REACTION: INTOL to Actonel  . Hydrocodone Nausea Only    REACTION: nausea

## 2014-05-05 ENCOUNTER — Encounter: Payer: Self-pay | Admitting: Pulmonary Disease

## 2014-05-05 ENCOUNTER — Ambulatory Visit (INDEPENDENT_AMBULATORY_CARE_PROVIDER_SITE_OTHER): Payer: Medicare Other | Admitting: Pulmonary Disease

## 2014-05-05 VITALS — BP 110/62 | HR 60 | Temp 97.9°F | Ht 60.0 in | Wt 104.8 lb

## 2014-05-05 DIAGNOSIS — Z8619 Personal history of other infectious and parasitic diseases: Secondary | ICD-10-CM | POA: Diagnosis not present

## 2014-05-05 DIAGNOSIS — R05 Cough: Secondary | ICD-10-CM | POA: Diagnosis not present

## 2014-05-05 DIAGNOSIS — R059 Cough, unspecified: Secondary | ICD-10-CM | POA: Diagnosis not present

## 2014-05-05 DIAGNOSIS — R9389 Abnormal findings on diagnostic imaging of other specified body structures: Secondary | ICD-10-CM

## 2014-05-05 DIAGNOSIS — Z8709 Personal history of other diseases of the respiratory system: Secondary | ICD-10-CM

## 2014-05-05 NOTE — Progress Notes (Signed)
Subjective:     Patient ID: [name], female   DOB: 06-17-1933, 80 y.o.   MRN: [id number]  HPI 80 y/o WF here for a follow up visit...   ~  Apr10:  she is doing well overall without new complaints or concerns... she has been under increased stress w/ her husb's bladder cancer (Rx by DrOttelin), and her grandson's recent Dx w/ an anaplastic ependymoma (s/p neurosurg & XRT)... she doesn't like taking meds and refuses statin therapy for her cholesterol... she is also followed by [facility] for GYN and she reports that he has checked her BMD (she remains on Forteo under his direction), and Vit D level (on OTC Vit D supplement)...   ~  February 26, 2010:  she is concerned because podiatrist DrTuckman dx "vasospasm" ?Raynaud's in her second toe bilaterally- she notes that it gets cold, turns purple, & "peels" in the winter... there is no pain, no involvement of other toes or fingers, and periph pulses are totally WNL.Marland Kitchen. we discussed this in detail & she is not inclined to try new meds (eg- Procardia)... sshe will f/u w/ Ortho (new foot doc at [facility] office) for their opinion...  otherw she is doing well- notes some neck pain but hasn't tried any meds & doesn't want pain Rx, she will also have this looked at by Ortho DrLucey... she will return for FASTING labs in several week.  ~  November 22, 2011:  25mo ROV & [name] reports that she is doing well, feels good & has no new complaints or concerns;  She is quite proud of the fact that she is not on any prescription medications> just OTC supplements like ASA, Niacin, Fish Oil RYR, Calcium, MVI, VitD;  She notes that there is an expanding hx of CAD in her family w/ a sister who had high chol being Dx w/ CAD & had CABG, one brother w/ stent;  We reviewed the strategy of risk factor reductions & I note that she has repeatedly refused Statin Rx for her dyslipidemia but she will accept referral to the Lipid clinic to discuss her options for treatment...    She  maintains GYN follow up w/ DrNeal for Mammograms/ PAP/ BMD & she tells me that he is referring her to DrAltheimer to discuss options for treating her Osteoporosis...    She is followed by [facility] for Ortho & while we don't have any notes from him to review the pt reports eval for neck pain in 2011 w/ arthritis and spondylosis on XRays & MRI work up;  ~  May 05, 2014:  56mo [place] has established w/ [facility] for her Primary Care; referred here today for pulmonary evaluation... She saw DrDaub 4/15 w/ URI, cough, bronchitis; she had neg rapid strep, normal CBC, & CXR interpreted by radiology as showing emphysema; she was treated w/ ZPak & Tessalon and her symptoms resolved; she was referred her due to the CXR reading>>    [name] has a very min smoking exposure having smoked 1-2 cig/d for 65yr in her teens & quit 1956; she had second hand exposure from husband's smoking, worked for [employer] and he quit in 1970...    She had croup as a child, but no hx lung problems as an adult-  w/o pneumonia, asthma, etc...    She denies cough, sputum, hemoptysis, dyspnea, etc...    No hx of any signif cardiac dis either- w/o HBP, CP, palpit, DOE, edema; she exercises for 66min on the treadmill and  gets in 10,000 steps, she takes the stairs regularly w/o difficulty...  We reviewed prob list, meds, xrays and labs> see below for updates >>   CXR 03/02/14 shows clear lungs, some incr in retrosternal clear space, no opacities or adenopathy, no effusion or fluid; Compared to old films going back to 2001 there does not appear to be any signif change in her XRay...  O2 sat is 99% on RA in the office today...  PFT 05/05/14 showed FVC=2.90 (132%), FEV1=1.87 (116%), %1sec=65, mid-flows= 68% predicted... This result shows super-physiologic airflow & no signif obstruction... IMPRESSION> she does not appear to have any signif obstructive lung dis, no signif pulm history & only very min smoke exposure yrs ago; I suspect the  radiologists CXR report is a bit of an over-read & her lungs are clear; I offered her FULL PFTs w/ lung volumes and Diffusion, coupled w/ a CT Chest for further evaluation but she feels very reassured and comfortable w/ the current eval & my opinion regarding her lungs... I told her we could address these issues at any time in the future if she deems necessary... No meds indicated at this time.           Problem List:  PHYSICAL EXAMINATION (ICD-V70.0) - on ASA 81mg /d... GYN= DrNeal for Pap, Mammograms, BMD- she reports he started FORTEO shots 8/08 and stopped after 60yrs therapy... she notes that BMD is improved- now on Caltrate alone... she had a Pneumovax in 2004, and gets yearly Flu shots... "I'm not a pill taker" she says... ~  CXR 4/10 showed RLL scarring, sl overinflated, otherw neg CXR w/ NAD...  HYPERCHOLESTEROLEMIA (ICD-272.0) - on diet alone, she has refused my rec's for Statin therapy in the past... states she's taking red yeast rice, Krill oil, and OTC Niacin... ~  FLP's over the last 50yrs: TChol 168-213, TG 72-113, HDL 44-53, LDL 107-145... ~  [place] 2/08 showed TChol 190, TG 78, HDL 48, LDL 126... she wanted to continue diet Rx. ~  FLP 4/09 showed TChol 234, TG 113, HDL 52, LDL 173... pt refused Simva rx. ~  FLP 4/10 showed TChol 223, TG 95, HDL 57, LDL 138... she wants to continue diet alone. ~  FLP 5/11 showed TChol 216, TG 76, HDL 51, LDL 150 ~  FLP 1/13 showed TChol 228, TG 76, LDL 54, LDL 159... Several sibs w/ recent CAD & she agrees to [facility] appt.  COLONIC POLYPS (ICD-211.3) - prev exams showed divertics, polyps- last 1/04= hyperplastic. ~  last colonoscopy 3/08 by DrPatterson was normal- no recurrent polyps... f/u planned 64yrs.  NECK PAIN (ICD-723.1) - c/o neck pain on & off... CSpine films 4/09 were neg but MRI 9/11 showed arthropathy & spondylosis... she refuses pain meds and will f/u w/ her Orthopedist DrLucey (he rec consult w/ DrHirsh). ~  She notes that DrLucey  rec shots from DrNewton> she tried it but no better she says... ~  She reports trying Celebrex but it was too strong, caused SOB she says & she stopped it...  Hx of BACK PAIN, LUMBAR (ICD-724.2) - s/p lumbar laminectomy 1985 by DrGioffre...  OSTEOPOROSIS (ICD-733.00) - as noted, took FORTEO x18yrs per DrNeal and he follows her BMD's (we don't have records)... prev on Evista, still takes ca++/ vits... she was intol to Actonel Rx in the past...   Past Surgical History  Procedure Laterality Date  . Lumbar laminectomy  1985    Dr. Gladstone Lighter  . Surgery for removal of rectal polyp  07/1995    Dr. Modena Morrow  . Abdominal hysterectomy  12/2003    DR. Neal--bso, A/P repair  . Cataract surgery    . Tubal ligation      Outpatient Encounter Prescriptions as of 05/05/2014  Medication Sig  . aspirin 81 MG tablet Take 81 mg by mouth daily.    . Calcium Carbonate-Vitamin D (CALTRATE 600+D) 600-400 MG-UNIT per tablet Take 1 tablet by mouth 2 (two) times daily.   . cholecalciferol (VITAMIN D) 1000 UNITS tablet Take 2,000 Units by mouth daily.   . niacin 500 MG tablet Take 1 tablet (500 mg total) by mouth 2 (two) times daily with a meal.  . Omega-3 Fatty Acids (FISH OIL) 300 MG CAPS Take 1 capsule by mouth daily.    . [DISCONTINUED] benzonatate (TESSALON) 100 MG capsule Take 1-2 capsules (100-200 mg total) by mouth 3 (three) times daily as needed for cough.    Allergies  Allergen Reactions  . Risedronate Sodium     REACTION: INTOL to Actonel  . Hydrocodone Nausea Only    REACTION: nausea    Current Medications, Allergies, Past Medical History, Past Surgical History, Family History, and Social History were reviewed in [employer] record.    Review of Systems        See HPI - all other systems neg except as noted...  The patient denies anorexia, fever, weight loss, weight gain, vision loss, decreased hearing, hoarseness, chest pain, syncope, dyspnea on exertion, peripheral  edema, prolonged cough, headaches, hemoptysis, abdominal pain, melena, hematochezia, severe indigestion/heartburn, hematuria, incontinence, muscle weakness, suspicious skin lesions, transient blindness, difficulty walking, depression, unusual weight change, abnormal bleeding, enlarged lymph nodes, and angioedema.     Objective:   Physical Exam     WD, WN, 80 y/o WF in NAD... GENERAL:  Alert & oriented; pleasant & cooperative... HEENT:  [place]/AT, EOM-wnl, PERRLA, EACs-clear, TMs-wnl, NOSE-clear, THROAT-clear drainage, no lesion.  NECK:  Supple w/ decrROM; no JVD; normal carotid impulses w/o bruits; no thyromegaly or nodules palpated; no lymphadenopathy. CHEST:  Clear to P & A; without wheezes/ rales/ or rhonchi. HEART:  Regular Rhythm; without murmurs/ rubs/ or gallops. ABDOMEN:  Soft & nontender; normal bowel sounds; no organomegaly or masses detected. EXT: without deformities, +arthritic changes in hands; no varicose veins/ venous insuffic/ or edema; distal pulses all normal. NEURO:  no focal neuro deficits... DERM:  no lesions seen...  RADIOLOGY DATA:  Reviewed in the EPIC EMR & discussed w/ the patient...  LABORATORY DATA:  Reviewed in the EPIC EMR & discussed w/ the patient...   Assessment:      CXR shows some overinflation & incr retrosternal clear space, but she remains asymptomatic & PFTs show superphysiologic numbers; she has a negligible smoke exposure & no signif pulm dis hx; I offered her FULL PFTs & CT CHEST for further eval but she is content w/ the current level of eval 7 feels reassured about her lungs; I mentioned that we could always pursue further eval in the future should the need arise...    +FamHx of heart disease>  We reviewed the need for risk factor reduction strategy & she agrees to go to the [facility] now at age 48...  Hyperlipidemia>  Her LDL is up to 159 but she is sure this came from overeating over the holidays; we reviewed a low chol/ low fat diet & she  will continue her exercise program; she has agreed to see the PharmD in the [facility].Marland KitchenMarland Kitchen  Hx Colon Polyps>  She will be due for a follow up colonoscopy from DrPatterson this year; she is reminded to give his office a call if she doesn't get a letter from them...  DJD, Neck Pain>  She declines prescription anti-inflamm Rx etc; we reviewed avail OTC meds; she will continue her f/u w/ DrLucey...  Osteoporosis>  Followed & managed by Elmon Else w/ BMDs at his office (we don't have copies); sahe had Forteo under his direction for 79yrs; she tells me he has referred her on to DrAltheimer for his suggestions...     Plan:     Patient's Medications  New Prescriptions   No medications on file  Previous Medications   ASPIRIN 81 MG TABLET    Take 81 mg by mouth daily.     CALCIUM CARBONATE-VITAMIN D (CALTRATE 600+D) 600-400 MG-UNIT PER TABLET    Take 1 tablet by mouth 2 (two) times daily.    CHOLECALCIFEROL (VITAMIN D) 1000 UNITS TABLET    Take 2,000 Units by mouth daily.    NIACIN 500 MG TABLET    Take 1 tablet (500 mg total) by mouth 2 (two) times daily with a meal.   OMEGA-3 FATTY ACIDS (FISH OIL) 300 MG CAPS    Take 1 capsule by mouth daily.    Modified Medications   No medications on file  Discontinued Medications   BENZONATATE (TESSALON) 100 MG CAPSULE    Take 1-2 capsules (100-200 mg total) by mouth 3 (three) times daily as needed for cough.

## 2014-06-17 ENCOUNTER — Telehealth: Payer: Self-pay | Admitting: Pharmacist

## 2014-06-17 MED ORDER — NIACIN ER (ANTIHYPERLIPIDEMIC) 1000 MG PO TBCR: 1000.0000 mg | EXTENDED_RELEASE_TABLET | Freq: Every day | ORAL | Status: DC

## 2014-06-17 NOTE — Telephone Encounter (Signed)
Pt called to follow up from her lipid clinic appt in June.  At that time, she was switched to immediate release Niacin with plans to titrate from 500mg  daily to 1000mg  daily after 4 weeks.  Pt is due to make titration, but states she does not want to do this.  She can only get the immediate release tablets in 500mg  and does not want to take it twice daily or 2 at once.  She states she has to eat too much applesauce with each dose to want to do it twice a day and the pills are too big to take 2 of them at one sitting.  Explained to pt that only option for 1000mg  in one tablet would be the prescription version.  Unsure if this will be much smaller of a tablet or not.  She is willing to try this.  Will send new Rx to pharmacy.

## 2014-06-21 ENCOUNTER — Other Ambulatory Visit: Payer: Self-pay | Admitting: Pharmacist

## 2014-06-21 MED ORDER — NIACIN ER (ANTIHYPERLIPIDEMIC) 1000 MG PO TBCR: 1000.0000 mg | EXTENDED_RELEASE_TABLET | Freq: Every day | ORAL | Status: DC

## 2014-06-30 DIAGNOSIS — Z01419 Encounter for gynecological examination (general) (routine) without abnormal findings: Secondary | ICD-10-CM | POA: Diagnosis not present

## 2014-06-30 DIAGNOSIS — Z1231 Encounter for screening mammogram for malignant neoplasm of breast: Secondary | ICD-10-CM | POA: Diagnosis not present

## 2014-07-20 ENCOUNTER — Other Ambulatory Visit: Payer: Self-pay

## 2014-07-20 ENCOUNTER — Encounter: Payer: Self-pay | Admitting: Pharmacist

## 2014-07-20 DIAGNOSIS — Z23 Encounter for immunization: Secondary | ICD-10-CM | POA: Diagnosis not present

## 2014-07-20 MED ORDER — NIACIN ER (ANTIHYPERLIPIDEMIC) 1000 MG PO TBCR: 1000.0000 mg | EXTENDED_RELEASE_TABLET | Freq: Every day | ORAL | Status: DC

## 2014-07-25 ENCOUNTER — Other Ambulatory Visit (INDEPENDENT_AMBULATORY_CARE_PROVIDER_SITE_OTHER): Payer: Medicare Other

## 2014-07-25 DIAGNOSIS — E785 Hyperlipidemia, unspecified: Secondary | ICD-10-CM | POA: Diagnosis not present

## 2014-07-25 DIAGNOSIS — Z79899 Other long term (current) drug therapy: Secondary | ICD-10-CM

## 2014-07-25 LAB — HEPATIC FUNCTION PANEL
ALT: 20 U/L (ref 0–35)
AST: 22 U/L (ref 0–37)
Albumin: 3.6 g/dL (ref 3.5–5.2)
Alkaline Phosphatase: 55 U/L (ref 39–117)
Bilirubin, Direct: 0 mg/dL (ref 0.0–0.3)
Total Bilirubin: 0.4 mg/dL (ref 0.2–1.2)
Total Protein: 6.7 g/dL (ref 6.0–8.3)

## 2014-07-25 LAB — LIPID PANEL
Cholesterol: 180 mg/dL (ref 0–200)
HDL: 57.9 mg/dL (ref >39.00)
LDL Cholesterol: 108 mg/dL — ABNORMAL HIGH (ref 0–99)
NonHDL: 122.1
Total CHOL/HDL Ratio: 3
Triglycerides: 69 mg/dL (ref 0.0–149.0)
VLDL: 13.8 mg/dL (ref 0.0–40.0)

## 2014-07-26 ENCOUNTER — Ambulatory Visit (INDEPENDENT_AMBULATORY_CARE_PROVIDER_SITE_OTHER): Payer: Medicare Other | Admitting: Pharmacist

## 2014-07-26 VITALS — Wt 103.0 lb

## 2014-07-26 DIAGNOSIS — E7800 Pure hypercholesterolemia, unspecified: Secondary | ICD-10-CM | POA: Diagnosis not present

## 2014-07-26 DIAGNOSIS — Z79899 Other long term (current) drug therapy: Secondary | ICD-10-CM | POA: Diagnosis not present

## 2014-07-26 MED ORDER — NIACIN ER (ANTIHYPERLIPIDEMIC) 1000 MG PO TBCR: 1000.0000 mg | EXTENDED_RELEASE_TABLET | Freq: Every day | ORAL | Status: DC

## 2014-07-26 NOTE — Progress Notes (Signed)
Patient is here for 3 month follow-up.  [name] was referred by Dr. Burt Knack for management of hyperlipidemia. She has a strong family history for CAD (6 siblings, several with hx of bypass surgery or PCI, mother had a stroke) but has no personal history of heart disease. She is physically active and health conscious. She has been a member of [employer] for 40+ years, weighs in once/month. Weight is pretty stable at 102-105 lbs. She is currently treated with OTC fish oil (vegetarian omega-3) and Niaspan 1000 mg qhs.  She has been reluctant to start a statin in the past.   She denies any flushing from her Niaspan.  LDL has dropped to 108 mg/dL and LDL down to 122 mg/dL which is well controlled and at goal for her.  She had trouble with flushing with Niacin IR form.  LDL goal < 130 mg/dL  Meds: Niaspan 1000 mg qhs, fish oil 300 mg qd  Diet: Breakfast: half a banana Lunch: [employer] - cup of jello with a side/veggie (mixed vegetables, mashed potatoes, corn) and a cup of coffee Dinner: cup of soup, sweet potato, may split a burger with her husband, and a glass of wine Snacks: loves peanut butter; takes morning and evening calcium with a spoonful of peanut butter -overall not a big eater, doesn't eat much meat   Exercise: Active around her house, climbs stairs (2 stories plus a finished basement). Goes to the [facility] and walks 45 min on the tread mill (goal 2-3x/wk). Wears a pedometer, goal 10,000 steps/day  Labs: 07/2014:  TC 180, TG 69, HDL 58, LDL 108, LFTs normal (niaspan 1000 mg qd) 04/2014:  TC 223, TG 83, HDL 55, LDL 151, LFTs normal (flush free niacin 1000 mg daily)  Current Outpatient Prescriptions  Medication Sig Dispense Refill  . aspirin 81 MG tablet Take 81 mg by mouth daily.        . Calcium Carbonate-Vitamin D (CALTRATE 600+D) 600-400 MG-UNIT per tablet Take 1 tablet by mouth 2 (two) times daily.       . cholecalciferol (VITAMIN D) 1000 UNITS tablet Take 2,000 Units by mouth daily.        . niacin (NIASPAN) 1000 MG CR tablet Take 1 tablet (1,000 mg total) by mouth daily.  90 tablet  0  . Omega-3 Fatty Acids (FISH OIL) 300 MG CAPS Take 1 capsule by mouth daily.         No current facility-administered medications for this visit.   Allergies  Allergen Reactions  . Risedronate Sodium     REACTION: INTOL to Actonel  . Hydrocodone Nausea Only    REACTION: nausea

## 2014-07-26 NOTE — Assessment & Plan Note (Signed)
Excellent reduction in cholesterol on niaspan 1000 mg qhs.  Tolerating regimen well.  Will continue niaspan 1000 mg qhs and recheck lipid panel and hepatic panel in 6 months.  Will call with results.

## 2014-07-26 NOTE — Patient Instructions (Signed)
1.  Continue niaspan 1000 mg at bedtime/evening. 2.  Take aspirin prior to niaspan and applesauce as needed. 3.  Recheck blood work in 6 months (01/23/15) - fasting labs  (lab opens at 7:30 am- show up anytime afterwards)

## 2014-07-28 ENCOUNTER — Ambulatory Visit: Payer: Medicare Other | Admitting: Pharmacist

## 2014-09-05 DIAGNOSIS — Z6820 Body mass index (BMI) 20.0-20.9, adult: Secondary | ICD-10-CM | POA: Diagnosis not present

## 2014-09-05 DIAGNOSIS — Z23 Encounter for immunization: Secondary | ICD-10-CM | POA: Diagnosis not present

## 2014-09-05 DIAGNOSIS — M810 Age-related osteoporosis without current pathological fracture: Secondary | ICD-10-CM | POA: Diagnosis not present

## 2014-09-05 DIAGNOSIS — E785 Hyperlipidemia, unspecified: Secondary | ICD-10-CM | POA: Insufficient documentation

## 2014-09-05 DIAGNOSIS — Z780 Asymptomatic menopausal state: Secondary | ICD-10-CM | POA: Diagnosis not present

## 2014-09-08 DIAGNOSIS — B029 Zoster without complications: Secondary | ICD-10-CM | POA: Insufficient documentation

## 2014-09-08 DIAGNOSIS — Z6820 Body mass index (BMI) 20.0-20.9, adult: Secondary | ICD-10-CM | POA: Diagnosis not present

## 2014-09-14 ENCOUNTER — Encounter: Payer: Self-pay | Admitting: Gastroenterology

## 2014-10-08 ENCOUNTER — Other Ambulatory Visit: Payer: Self-pay | Admitting: Cardiovascular Disease

## 2014-10-28 ENCOUNTER — Encounter (HOSPITAL_COMMUNITY): Payer: Self-pay | Admitting: Cardiovascular Disease

## 2014-11-15 DIAGNOSIS — H5213 Myopia, bilateral: Secondary | ICD-10-CM | POA: Diagnosis not present

## 2014-11-15 DIAGNOSIS — Z9849 Cataract extraction status, unspecified eye: Secondary | ICD-10-CM | POA: Diagnosis not present

## 2014-11-15 DIAGNOSIS — H524 Presbyopia: Secondary | ICD-10-CM | POA: Diagnosis not present

## 2014-11-15 DIAGNOSIS — H52223 Regular astigmatism, bilateral: Secondary | ICD-10-CM | POA: Diagnosis not present

## 2014-11-15 DIAGNOSIS — H04123 Dry eye syndrome of bilateral lacrimal glands: Secondary | ICD-10-CM | POA: Diagnosis not present

## 2014-11-15 DIAGNOSIS — Z961 Presence of intraocular lens: Secondary | ICD-10-CM | POA: Diagnosis not present

## 2014-11-23 DIAGNOSIS — M810 Age-related osteoporosis without current pathological fracture: Secondary | ICD-10-CM | POA: Diagnosis not present

## 2014-11-23 DIAGNOSIS — Z6821 Body mass index (BMI) 21.0-21.9, adult: Secondary | ICD-10-CM | POA: Diagnosis not present

## 2014-12-05 DIAGNOSIS — Z6821 Body mass index (BMI) 21.0-21.9, adult: Secondary | ICD-10-CM | POA: Diagnosis not present

## 2014-12-05 DIAGNOSIS — L309 Dermatitis, unspecified: Secondary | ICD-10-CM | POA: Insufficient documentation

## 2015-01-05 ENCOUNTER — Other Ambulatory Visit: Payer: Self-pay | Admitting: Cardiovascular Disease

## 2015-01-10 DIAGNOSIS — L570 Actinic keratosis: Secondary | ICD-10-CM | POA: Diagnosis not present

## 2015-01-10 DIAGNOSIS — L821 Other seborrheic keratosis: Secondary | ICD-10-CM | POA: Diagnosis not present

## 2015-01-13 ENCOUNTER — Ambulatory Visit (INDEPENDENT_AMBULATORY_CARE_PROVIDER_SITE_OTHER): Payer: Medicare Other | Admitting: Cardiovascular Disease

## 2015-01-13 ENCOUNTER — Encounter: Payer: Self-pay | Admitting: Cardiovascular Disease

## 2015-01-13 DIAGNOSIS — R079 Chest pain, unspecified: Secondary | ICD-10-CM | POA: Diagnosis not present

## 2015-01-13 DIAGNOSIS — E785 Hyperlipidemia, unspecified: Secondary | ICD-10-CM

## 2015-01-13 DIAGNOSIS — R002 Palpitations: Secondary | ICD-10-CM | POA: Diagnosis not present

## 2015-01-13 NOTE — Progress Notes (Signed)
Cardiology Office Note   Date:  01/13/2015   ID:  [name], DOB [birth year], MRN [id number]  PCP:  Jerlyn Ly, MD  Cardiologist:  Sherren Mocha, MD    Chief Complaint  Patient presents with  . Hyperlipidemia    History of Present Illness: [name] is a 81 y.o. female who presents for follow-up of hyperlipidemia and strong family history of CAD. She was initially seen here one year ago with a focus on preventative cardiac care. She has not had any cardiac related symptoms to date. The patient has been followed in the lipid clinic and was last seen in September 2015. Her hyperlipidemia has been treated with Niaspan. The patient has had flushing with immediate release niacin. Last lipid panel from September 2015 demonstrated a cholesterol of 180, triglycerides 69, HDL 58, and LDL 108. This was improved from her previous values just 3 months earlier when her total cholesterol was 223 and her LDL was 151.  The patient is doing well. She has no symptoms with physical exertion. She specifically denies chest pain, chest pressure, shortness of breath, lightheadedness, or leg swelling. She remains very active.   Past Medical History  Diagnosis Date  . Hypercholesterolemia   . Hx of colonic polyps   . Neck pain   . Lumbar back pain   . Osteoporosis   . Cataract     Past Surgical History  Procedure Laterality Date  . Lumbar laminectomy  1985    Dr. Gladstone Lighter  . Surgery for removal of rectal polyp  07/1995    Dr. Modena Morrow  . Abdominal hysterectomy  12/2003    DR. Neal--bso, A/P repair  . Cataract surgery    . Tubal ligation      Current Outpatient Prescriptions  Medication Sig Dispense Refill  . aspirin 81 MG tablet Take 81 mg by mouth daily.      . Calcium Carbonate-Vitamin D (CALTRATE 600+D) 600-400 MG-UNIT per tablet Take 1 tablet by mouth 2 (two) times daily.     . cholecalciferol (VITAMIN D) 1000 UNITS tablet Take 2,000 Units by mouth daily.     . niacin  (NIASPAN) 1000 MG CR tablet TAKE 1 TABLET BY MOUTH EVERY DAY 90 tablet 0  . Omega-3 Fatty Acids (FISH OIL) 300 MG CAPS Take 1 capsule by mouth daily.       No current facility-administered medications for this visit.    Allergies:   Risedronate sodium and Hydrocodone   Social History:  The patient  reports that she quit smoking about 60 years ago. Her smoking use included Cigarettes. She has a .25 pack-year smoking history. She has never used smokeless tobacco. She reports that she drinks about 3.5 oz of alcohol per week. She reports that she does not use illicit drugs.   Family History:  The patient's  family history includes Heart Problems in her brother and sister; Stroke in her mother.    ROS:  Please see the history of present illness.  Otherwise, review of systems is positive for easy bruising.  All other systems are reviewed and negative.    PHYSICAL EXAM: VS:  BP 140/66 mmHg  Pulse 67  Ht 5' (1.524 m)  Wt 105 lb 9.6 oz (47.9 kg)  BMI 20.62 kg/m2 , BMI Body mass index is 20.62 kg/(m^2). GEN: Well nourished, well developed, pleasant elderly woman in no acute distress HEENT: normal Neck: no JVD, no masses, no carotid bruits Cardiac: RRR without murmur or gallop  Respiratory:  clear to auscultation bilaterally, normal work of breathing GI: soft, nontender, nondistended, + BS MS: no deformity or atrophy Ext: no pretibial edema Skin: warm and dry, no rash Neuro:  Strength and sensation are intact Psych: euthymic mood, full affect  EKG:  EKG is ordered today. The ekg ordered today shows normal sinus rhythm 67 bpm, nonspecific ST abdomen.  Recent Labs: 03/02/2014: Hemoglobin 13.4 07/25/2014: ALT 20   Lipid Panel     Component Value Date/Time   CHOL 180 07/25/2014 0804   TRIG 69.0 07/25/2014 0804   HDL 57.90 07/25/2014 0804   CHOLHDL 3 07/25/2014 0804   VLDL 13.8 07/25/2014 0804   LDLCALC 108* 07/25/2014 0804   LDLDIRECT 158.8 11/14/2011 0733      Wt  Readings from Last 3 Encounters:  01/13/15 105 lb 9.6 oz (47.9 kg)  07/26/14 103 lb (46.72 kg)  05/05/14 104 lb 12.8 oz (47.537 kg)    ASSESSMENT AND PLAN: Hyperlipidemia: Last lipids reviewed with good reduction in total and LDL cholesterol on long-acting Niaspan. Will repeat lipids and LFTs. As long as her lipid panel looks stable, I will plan on seeing her back in one year with repeat labs at that time. She is asymptomatic from a cardiac perspective.    Current medicines are reviewed with the patient today.  The patient does not have concerns regarding medicines.  The following changes have been made:  no change  Labs/ tests ordered today include:  No orders of the defined types were placed in this encounter.    Disposition:   FU one year with repeat labs  Signed, Sherren Mocha, MD  01/13/2015 9:05 AM    [facility], [place], [place]  [address] Phone: [phone]; Fax: [phone]

## 2015-01-13 NOTE — Patient Instructions (Signed)
Your physician recommends that you return for lab work March 9th (Lipid/Liver)    Your physician wants you to follow-up in: 1 year follow up with Dr. Emelda Fear will receive a reminder letter in the mail two months in advance. If you don't receive a letter, please call our office to schedule the follow-up appointment.

## 2015-01-18 ENCOUNTER — Other Ambulatory Visit (INDEPENDENT_AMBULATORY_CARE_PROVIDER_SITE_OTHER): Payer: Medicare Other | Admitting: *Deleted

## 2015-01-18 DIAGNOSIS — E785 Hyperlipidemia, unspecified: Secondary | ICD-10-CM | POA: Diagnosis not present

## 2015-01-18 LAB — HEPATIC FUNCTION PANEL
ALT: 16 U/L (ref 0–35)
AST: 27 U/L (ref 0–37)
Albumin: 3.8 g/dL (ref 3.5–5.2)
Alkaline Phosphatase: 54 U/L (ref 39–117)
Bilirubin, Direct: 0.1 mg/dL (ref 0.0–0.3)
Total Bilirubin: 0.4 mg/dL (ref 0.2–1.2)
Total Protein: 6.7 g/dL (ref 6.0–8.3)

## 2015-01-18 LAB — LIPID PANEL
Cholesterol: 174 mg/dL (ref 0–200)
HDL: 55.8 mg/dL (ref >39.00)
LDL Cholesterol: 106 mg/dL — ABNORMAL HIGH (ref 0–99)
NonHDL: 118.2
Total CHOL/HDL Ratio: 3
Triglycerides: 59 mg/dL (ref 0.0–149.0)
VLDL: 11.8 mg/dL (ref 0.0–40.0)

## 2015-01-23 ENCOUNTER — Other Ambulatory Visit: Payer: Medicare Other

## 2015-02-08 DIAGNOSIS — J029 Acute pharyngitis, unspecified: Secondary | ICD-10-CM | POA: Diagnosis not present

## 2015-02-08 DIAGNOSIS — R05 Cough: Secondary | ICD-10-CM | POA: Diagnosis not present

## 2015-02-08 DIAGNOSIS — J309 Allergic rhinitis, unspecified: Secondary | ICD-10-CM | POA: Diagnosis not present

## 2015-02-08 DIAGNOSIS — Z6820 Body mass index (BMI) 20.0-20.9, adult: Secondary | ICD-10-CM | POA: Diagnosis not present

## 2015-04-05 ENCOUNTER — Other Ambulatory Visit: Payer: Self-pay | Admitting: Cardiovascular Disease

## 2015-05-01 DIAGNOSIS — R600 Localized edema: Secondary | ICD-10-CM | POA: Diagnosis not present

## 2015-05-01 DIAGNOSIS — Z6820 Body mass index (BMI) 20.0-20.9, adult: Secondary | ICD-10-CM | POA: Diagnosis not present

## 2015-06-21 DIAGNOSIS — Z23 Encounter for immunization: Secondary | ICD-10-CM | POA: Diagnosis not present

## 2015-07-04 ENCOUNTER — Other Ambulatory Visit: Payer: Self-pay | Admitting: Obstetrics & Gynecology

## 2015-07-04 DIAGNOSIS — Z6820 Body mass index (BMI) 20.0-20.9, adult: Secondary | ICD-10-CM | POA: Diagnosis not present

## 2015-07-04 DIAGNOSIS — Z01419 Encounter for gynecological examination (general) (routine) without abnormal findings: Secondary | ICD-10-CM | POA: Diagnosis not present

## 2015-07-04 DIAGNOSIS — Z124 Encounter for screening for malignant neoplasm of cervix: Secondary | ICD-10-CM | POA: Diagnosis not present

## 2015-07-04 DIAGNOSIS — Z1231 Encounter for screening mammogram for malignant neoplasm of breast: Secondary | ICD-10-CM | POA: Diagnosis not present

## 2015-07-04 DIAGNOSIS — Z1272 Encounter for screening for malignant neoplasm of vagina: Secondary | ICD-10-CM | POA: Diagnosis not present

## 2015-07-05 LAB — CYTOLOGY - PAP

## 2015-07-07 ENCOUNTER — Other Ambulatory Visit (HOSPITAL_COMMUNITY): Payer: Self-pay | Admitting: Internal Medicine

## 2015-07-13 ENCOUNTER — Ambulatory Visit (HOSPITAL_COMMUNITY)
Admission: RE | Admit: 2015-07-13 | Discharge: 2015-07-13 | Disposition: A | Discharge status: Home / Self Care | Payer: Medicare Other | Source: Ambulatory Visit | Attending: Internal Medicine | Admitting: Internal Medicine

## 2015-07-13 ENCOUNTER — Encounter (HOSPITAL_COMMUNITY): Payer: Self-pay

## 2015-07-13 DIAGNOSIS — M810 Age-related osteoporosis without current pathological fracture: Secondary | ICD-10-CM | POA: Insufficient documentation

## 2015-07-13 MED ORDER — DENOSUMAB 60 MG/ML ~~LOC~~ SOLN
60.0000 mg | Freq: Once | SUBCUTANEOUS | Status: AC
  Administered 2015-07-13: 60 mg via SUBCUTANEOUS
  Filled 2015-07-13: qty 1

## 2015-08-17 DIAGNOSIS — N958 Other specified menopausal and perimenopausal disorders: Secondary | ICD-10-CM | POA: Diagnosis not present

## 2015-08-17 DIAGNOSIS — M816 Localized osteoporosis [Lequesne]: Secondary | ICD-10-CM | POA: Diagnosis not present

## 2015-10-18 DIAGNOSIS — E784 Other hyperlipidemia: Secondary | ICD-10-CM | POA: Diagnosis not present

## 2015-10-18 DIAGNOSIS — M810 Age-related osteoporosis without current pathological fracture: Secondary | ICD-10-CM | POA: Diagnosis not present

## 2015-10-26 DIAGNOSIS — Z6820 Body mass index (BMI) 20.0-20.9, adult: Secondary | ICD-10-CM | POA: Diagnosis not present

## 2015-10-26 DIAGNOSIS — D7589 Other specified diseases of blood and blood-forming organs: Secondary | ICD-10-CM | POA: Diagnosis not present

## 2015-10-26 DIAGNOSIS — R600 Localized edema: Secondary | ICD-10-CM | POA: Diagnosis not present

## 2015-10-26 DIAGNOSIS — L309 Dermatitis, unspecified: Secondary | ICD-10-CM | POA: Diagnosis not present

## 2015-10-26 DIAGNOSIS — D539 Nutritional anemia, unspecified: Secondary | ICD-10-CM | POA: Diagnosis not present

## 2015-10-26 DIAGNOSIS — E784 Other hyperlipidemia: Secondary | ICD-10-CM | POA: Diagnosis not present

## 2015-10-26 DIAGNOSIS — Z1389 Encounter for screening for other disorder: Secondary | ICD-10-CM | POA: Insufficient documentation

## 2015-10-26 DIAGNOSIS — Z0000 Encounter for general adult medical examination without abnormal findings: Secondary | ICD-10-CM | POA: Diagnosis not present

## 2015-10-26 DIAGNOSIS — M810 Age-related osteoporosis without current pathological fracture: Secondary | ICD-10-CM | POA: Diagnosis not present

## 2015-10-26 DIAGNOSIS — J309 Allergic rhinitis, unspecified: Secondary | ICD-10-CM | POA: Diagnosis not present

## 2015-10-26 DIAGNOSIS — Z780 Asymptomatic menopausal state: Secondary | ICD-10-CM | POA: Diagnosis not present

## 2015-10-26 DIAGNOSIS — B029 Zoster without complications: Secondary | ICD-10-CM | POA: Diagnosis not present

## 2015-11-22 DIAGNOSIS — H1045 Other chronic allergic conjunctivitis: Secondary | ICD-10-CM | POA: Diagnosis not present

## 2015-12-07 DIAGNOSIS — H52221 Regular astigmatism, right eye: Secondary | ICD-10-CM | POA: Diagnosis not present

## 2015-12-07 DIAGNOSIS — Z961 Presence of intraocular lens: Secondary | ICD-10-CM | POA: Diagnosis not present

## 2015-12-07 DIAGNOSIS — H5213 Myopia, bilateral: Secondary | ICD-10-CM | POA: Diagnosis not present

## 2015-12-07 DIAGNOSIS — H26493 Other secondary cataract, bilateral: Secondary | ICD-10-CM | POA: Diagnosis not present

## 2015-12-22 DIAGNOSIS — Z1211 Encounter for screening for malignant neoplasm of colon: Secondary | ICD-10-CM | POA: Diagnosis not present

## 2015-12-22 DIAGNOSIS — R600 Localized edema: Secondary | ICD-10-CM | POA: Diagnosis not present

## 2015-12-22 DIAGNOSIS — I872 Venous insufficiency (chronic) (peripheral): Secondary | ICD-10-CM | POA: Diagnosis not present

## 2015-12-22 DIAGNOSIS — Z6821 Body mass index (BMI) 21.0-21.9, adult: Secondary | ICD-10-CM | POA: Diagnosis not present

## 2015-12-24 ENCOUNTER — Other Ambulatory Visit: Payer: Self-pay | Admitting: Cardiovascular Disease

## 2015-12-29 DIAGNOSIS — H00021 Hordeolum internum right upper eyelid: Secondary | ICD-10-CM | POA: Diagnosis not present

## 2016-01-04 DIAGNOSIS — E784 Other hyperlipidemia: Secondary | ICD-10-CM | POA: Diagnosis not present

## 2016-01-10 DIAGNOSIS — L309 Dermatitis, unspecified: Secondary | ICD-10-CM | POA: Diagnosis not present

## 2016-01-10 DIAGNOSIS — D3617 Benign neoplasm of peripheral nerves and autonomic nervous system of trunk, unspecified: Secondary | ICD-10-CM | POA: Diagnosis not present

## 2016-01-10 DIAGNOSIS — C44712 Basal cell carcinoma of skin of right lower limb, including hip: Secondary | ICD-10-CM | POA: Diagnosis not present

## 2016-01-10 DIAGNOSIS — D485 Neoplasm of uncertain behavior of skin: Secondary | ICD-10-CM | POA: Diagnosis not present

## 2016-01-10 DIAGNOSIS — D225 Melanocytic nevi of trunk: Secondary | ICD-10-CM | POA: Diagnosis not present

## 2016-01-10 DIAGNOSIS — L821 Other seborrheic keratosis: Secondary | ICD-10-CM | POA: Diagnosis not present

## 2016-01-12 ENCOUNTER — Encounter (HOSPITAL_COMMUNITY): Payer: Self-pay

## 2016-01-12 ENCOUNTER — Ambulatory Visit (HOSPITAL_COMMUNITY)
Admission: RE | Admit: 2016-01-12 | Discharge: 2016-01-12 | Disposition: A | Discharge status: Home / Self Care | Payer: Medicare Other | Source: Ambulatory Visit | Attending: Internal Medicine | Admitting: Internal Medicine

## 2016-01-12 ENCOUNTER — Other Ambulatory Visit (HOSPITAL_COMMUNITY): Payer: Self-pay | Admitting: Internal Medicine

## 2016-01-12 DIAGNOSIS — M810 Age-related osteoporosis without current pathological fracture: Secondary | ICD-10-CM | POA: Insufficient documentation

## 2016-01-12 DIAGNOSIS — M25473 Effusion, unspecified ankle: Secondary | ICD-10-CM

## 2016-01-12 HISTORY — DX: Effusion, unspecified ankle: M25.473

## 2016-01-12 MED ORDER — DENOSUMAB 60 MG/ML ~~LOC~~ SOLN
60.0000 mg | Freq: Once | SUBCUTANEOUS | Status: AC
  Administered 2016-01-12: 60 mg via SUBCUTANEOUS
  Filled 2016-01-12: qty 1

## 2016-01-12 NOTE — Discharge Instructions (Signed)
PROLIA °Denosumab injection °What is this medicine? °DENOSUMAB (den oh sue mab) slows bone breakdown. Prolia is used to treat osteoporosis in women after menopause and in men. Xgeva is used to prevent bone fractures and other bone problems caused by cancer bone metastases. Xgeva is also used to treat giant cell tumor of the bone. °This medicine may be used for other purposes; ask your health care provider or pharmacist if you have questions. °What should I tell my health care provider before I take this medicine? °They need to know if you have any of these conditions: °-dental disease °-eczema °-infection or history of infections °-kidney disease or on dialysis °-low blood calcium or vitamin D °-malabsorption syndrome °-scheduled to have surgery or tooth extraction °-taking medicine that contains denosumab °-thyroid or parathyroid disease °-an unusual reaction to denosumab, other medicines, foods, dyes, or preservatives °-pregnant or trying to get pregnant °-breast-feeding °How should I use this medicine? °This medicine is for injection under the skin. It is given by a health care professional in a hospital or clinic setting. °If you are getting Prolia, a special MedGuide will be given to you by the pharmacist with each prescription and refill. Be sure to read this information carefully each time. °For Prolia, talk to your pediatrician regarding the use of this medicine in children. Special care may be needed. For Xgeva, talk to your pediatrician regarding the use of this medicine in children. While this drug may be prescribed for children as young as 13 years for selected conditions, precautions do apply. °Overdosage: If you think you have taken too much of this medicine contact a poison control center or emergency room at once. °NOTE: This medicine is only for you. Do not share this medicine with others. °What if I miss a dose? °It is important not to miss your dose. Call your doctor or health care professional if  you are unable to keep an appointment. °What may interact with this medicine? °Do not take this medicine with any of the following medications: °-other medicines containing denosumab °This medicine may also interact with the following medications: °-medicines that suppress the immune system °-medicines that treat cancer °-steroid medicines like prednisone or cortisone °This list may not describe all possible interactions. Give your health care provider a list of all the medicines, herbs, non-prescription drugs, or dietary supplements you use. Also tell them if you smoke, drink alcohol, or use illegal drugs. Some items may interact with your medicine. °What should I watch for while using this medicine? °Visit your doctor or health care professional for regular checks on your progress. Your doctor or health care professional may order blood tests and other tests to see how you are doing. °Call your doctor or health care professional if you get a cold or other infection while receiving this medicine. Do not treat yourself. This medicine may decrease your body's ability to fight infection. °You should make sure you get enough calcium and vitamin D while you are taking this medicine, unless your doctor tells you not to. Discuss the foods you eat and the vitamins you take with your health care professional. °See your dentist regularly. Brush and floss your teeth as directed. Before you have any dental work done, tell your dentist you are receiving this medicine. °Do not become pregnant while taking this medicine or for 5 months after stopping it. Women should inform their doctor if they wish to become pregnant or think they might be pregnant. There is a potential for serious side   effects to an unborn child. Talk to your health care professional or pharmacist for more information. °What side effects may I notice from receiving this medicine? °Side effects that you should report to your doctor or health care professional as  soon as possible: °-allergic reactions like skin rash, itching or hives, swelling of the face, lips, or tongue °-breathing problems °-chest pain °-fast, irregular heartbeat °-feeling faint or lightheaded, falls °-fever, chills, or any other sign of infection °-muscle spasms, tightening, or twitches °-numbness or tingling °-skin blisters or bumps, or is dry, peels, or red °-slow healing or unexplained pain in the mouth or jaw °-unusual bleeding or bruising °Side effects that usually do not require medical attention (Report these to your doctor or health care professional if they continue or are bothersome.): °-muscle pain °-stomach upset, gas °This list may not describe all possible side effects. Call your doctor for medical advice about side effects. You may report side effects to FDA at 1-800-FDA-1088. °Where should I keep my medicine? °This medicine is only given in a clinic, doctor's office, or other health care setting and will not be stored at home. °NOTE: This sheet is a summary. It may not cover all possible information. If you have questions about this medicine, talk to your doctor, pharmacist, or health care provider. °  °© 2016, Elsevier/Gold Standard. (2012-04-27 12:37:47) °Osteoporosis °Osteoporosis is the thinning and loss of density in the bones. Osteoporosis makes the bones more brittle, fragile, and likely to break (fracture). Over time, osteoporosis can cause the bones to become so weak that they fracture after a simple fall. The bones most likely to fracture are the bones in the hip, wrist, and spine. °CAUSES  °The exact cause is not known. °RISK FACTORS °Anyone can develop osteoporosis. You may be at greater risk if you have a family history of the condition or have poor nutrition. You may also have a higher risk if you are:  °· Female.   °· 50 years old or older. °· A smoker. °· Not physically active.   °· White or Asian. °· Slender. °SIGNS AND SYMPTOMS  °A fracture might be the first sign of the  disease, especially if it results from a fall or injury that would not usually cause a bone to break. Other signs and symptoms include:  °· Low back and neck pain. °· Stooped posture. °· Height loss. °DIAGNOSIS  °To make a diagnosis, your health care provider may: °· Take a medical history. °· Perform a physical exam. °· Order tests, such as: °¨ A bone mineral density test. °¨ A dual-energy X-ray absorptiometry test. °TREATMENT  °The goal of osteoporosis treatment is to strengthen your bones to reduce your risk of a fracture. Treatment may involve: °· Making lifestyle changes, such as: °¨ Eating a diet rich in calcium. °¨ Doing weight-bearing and muscle-strengthening exercises. °¨ Stopping tobacco use. °¨ Limiting alcohol intake. °· Taking medicine to slow the process of bone loss or to increase bone density. °· Monitoring your levels of calcium and vitamin D. °HOME CARE INSTRUCTIONS °· Include calcium and vitamin D in your diet. Calcium is important for bone health, and vitamin D helps the body absorb calcium. °· Perform weight-bearing and muscle-strengthening exercises as directed by your health care provider. °· Do not use any tobacco products, including cigarettes, chewing tobacco, and electronic cigarettes. If you need help quitting, ask your health care provider. °· Limit your alcohol intake. °· Take medicines only as directed by your health care provider. °· Keep all   follow-up visits as directed by your health care provider. This is important. °· Take precautions at home to lower your risk of falling, such as: °¨ Keeping rooms well lit and clutter free. °¨ Installing safety rails on stairs. °¨ Using rubber mats in the bathroom and other areas that are often wet or slippery. °SEEK IMMEDIATE MEDICAL CARE IF:  °You fall or injure yourself.  °  °This information is not intended to replace advice given to you by your health care provider. Make sure you discuss any questions you have with your health care  provider. °  °Document Released: 08/07/2005 Document Revised: 11/18/2014 Document Reviewed: 04/07/2014 °Elsevier Interactive Patient Education ©2016 Elsevier Inc. ° ° °

## 2016-01-12 NOTE — Progress Notes (Signed)
Pt arrives today for her every 6 month Prolia injection. Her first dose was 6 months ago 8/16. Pt states she tolerated that does well but she states she had a rash on her face 3 days after that injection and was unsure if it was related to the Prolia or not. She states it did not last long and she did not notify Dr Joylene Draft. Pt desires to receive the Prolia today and she will definitely inform Dr Joylene Draft if she develops a rash after this injection. Prolia given as ordered and patient was discharged unaccompanied to elevator

## 2016-01-31 ENCOUNTER — Encounter: Payer: Self-pay | Admitting: Vascular Surgery

## 2016-02-06 ENCOUNTER — Encounter: Payer: Self-pay | Admitting: Vascular Surgery

## 2016-02-06 ENCOUNTER — Ambulatory Visit (INDEPENDENT_AMBULATORY_CARE_PROVIDER_SITE_OTHER): Payer: Medicare Other | Admitting: Vascular Surgery

## 2016-02-06 ENCOUNTER — Other Ambulatory Visit: Payer: Self-pay | Admitting: *Deleted

## 2016-02-06 VITALS — BP 138/68 | HR 76 | Temp 97.5°F | Resp 14 | Ht 60.0 in | Wt 107.0 lb

## 2016-02-06 DIAGNOSIS — I83892 Varicose veins of left lower extremities with other complications: Secondary | ICD-10-CM

## 2016-02-06 DIAGNOSIS — R600 Localized edema: Secondary | ICD-10-CM

## 2016-02-06 NOTE — Progress Notes (Signed)
[facility]  Patient name: [name] MRN: [id number] DOB: 1933/06/23 Sex: female  REASON FOR CONSULT: Left leg swelling with possible venous hypertension  HPI: [name] is a 82 y.o. female, who is seen today for progressive swelling in her left leg. She reports this is been present for quite some time. She was placed in graduated compression garments in December. She's had a difficult time wearing these and has not noted any specific improvement. She does not have any history of DVT. She has no history of peripheral vascular disease or cardiac disease. She reports the diuretic has made little difference in this. She does have a history of removal of skin cancers on her right leg. No venous ulcers on her left leg.  Past Medical History  Diagnosis Date  . Hypercholesterolemia   . Hx of colonic polyps   . Neck pain   . Lumbar back pain   . Osteoporosis   . Cataract   . Swelling of ankle 01/12/16    bilateral. Pt is taking lasix x 2 weeks and wearing compression stockings    Family History  Problem Relation Age of Onset  . Heart Problems Sister     triple bipass  . Heart Problems Brother     blocked arteries, stent.  . Stroke Mother     SOCIAL HISTORY: Social History   Social History  . Marital Status: Married    Spouse Name: N/A  . Number of Children: 7  . Years of Education: N/A   Occupational History  . retired    Social History Main Topics  . Smoking status: Former Smoker -- 0.25 packs/day for 1 years    Types: Cigarettes    Quit date: 11/11/1954  . Smokeless tobacco: Never Used  . Alcohol Use: 3.5 oz/week    7 Standard drinks or equivalent per week     Comment: wine with dinner  . Drug Use: No  . Sexual Activity: No   Other Topics Concern  . Not on file   Social History Narrative    Allergies  Allergen Reactions  . Risedronate Sodium Other (See Comments)    REACTION: INTOL to Actonel  . Hydrocodone Nausea  Only    REACTION: nausea    Current Outpatient Prescriptions  Medication Sig Dispense Refill  . aspirin 81 MG tablet Take 81 mg by mouth daily.      . Calcium Carbonate-Vitamin D (CALTRATE 600+D) 600-400 MG-UNIT per tablet Take 1 tablet by mouth 2 (two) times daily.     . cholecalciferol (VITAMIN D) 1000 UNITS tablet Take 2,000 Units by mouth daily.     . Cyanocobalamin (VITAMIN B 12 PO) Take 1 tablet by mouth daily.    . furosemide (LASIX) 20 MG tablet Take 20 mg by mouth. For a period of 2 weeks for leg swelling per MD . Also using compression stockings    . niacin (NIASPAN) 1000 MG CR tablet TAKE 1 TABLET BY MOUTH EVERY DAY 90 tablet 0  . Omega-3 Fatty Acids (FISH OIL) 300 MG CAPS Take 1 capsule by mouth daily.       No current facility-administered medications for this visit.    REVIEW OF SYSTEMS:  [X]  denotes positive finding, [ ]  denotes negative finding Cardiac  Comments:  Chest pain or chest pressure:    Shortness of breath upon exertion:    Short of breath when lying flat:    Irregular heart rhythm:  Vascular    Pain in calf, thigh, or hip brought on by ambulation:    Pain in feet at night that wakes you up from your sleep:     Blood clot in your veins:    Leg swelling:  x       Pulmonary    Oxygen at home:    Productive cough:     Wheezing:         Neurologic    Sudden weakness in arms or legs:     Sudden numbness in arms or legs:     Sudden onset of difficulty speaking or slurred speech:    Temporary loss of vision in one eye:     Problems with dizziness:         Gastrointestinal    Blood in stool:     Vomited blood:         Genitourinary    Burning when urinating:     Blood in urine:        Psychiatric    Major depression:         Hematologic    Bleeding problems:    Problems with blood clotting too easily:        Skin    Rashes or ulcers:        Constitutional    Fever or chills:      PHYSICAL EXAM: Filed Vitals:   02/06/16 1105    BP: 138/68  Pulse: 76  Temp: 97.5 F (36.4 C)  TempSrc: Oral  Resp: 14  Height: 5' (1.524 m)  Weight: 107 lb (48.535 kg)  SpO2: 99%    GENERAL: The patient is a well-nourished female, in no acute distress. The vital signs are documented above. CARDIAC: There is a regular rate and rhythm.  VASCULAR: 2+ radial and 3+ dorsalis pedis pulses bilaterally PULMONARY: There is good air exchange bilaterally without wheezing or rales. ABDOMEN: Soft and non-tender  MUSCULOSKELETAL: There are no major deformities or cyanosis. NEUROLOGIC: No focal weakness or paresthesias are detected. SKIN: There are no ulcers or rashes noted. Does have pitting edema in her left leg and no edema in her right leg. The edema goes and the proximal foot but does not extend down onto the dorsum of her foot or onto her toes. PSYCHIATRIC: The patient has a normal affect.  DATA:  She does not have a formal venous duplex. I did image her saphenous vein with SonoSite ultrasound this does appear to be enlarged with multiple branches arising from this. There is some interstitial fluid seen with ultrasound as well.  MEDICAL ISSUES: I discussed these findings with the patient. Explained that she does not have any limb threatening issues such as arterial disease. This does not appear clinically to be consistent with lymphedema but I did explain there is quite a bit of overlap with venous hypertension and lymphedema. Have recommended a formal venous duplex to determine if she has evidence of venous incompetence in the deep or superficial system explaining this. We [name] discuss this further following the study. I did explain that she may be a candidate for ablation of her saphenous vein to improve her symptoms if this does indeed appear to be the main source of her swelling. She [name] continue to wear graduated compression garments in the meantime.   Curt Jews [facility] [place] [facility]: [phone]

## 2016-02-07 ENCOUNTER — Ambulatory Visit (HOSPITAL_COMMUNITY)
Admission: RE | Admit: 2016-02-07 | Discharge: 2016-02-07 | Disposition: A | Discharge status: Home / Self Care | Payer: Medicare Other | Source: Ambulatory Visit | Attending: Vascular Surgery | Admitting: Vascular Surgery

## 2016-02-07 DIAGNOSIS — R600 Localized edema: Secondary | ICD-10-CM

## 2016-02-07 DIAGNOSIS — E7800 Pure hypercholesterolemia, unspecified: Secondary | ICD-10-CM | POA: Diagnosis not present

## 2016-02-08 ENCOUNTER — Telehealth: Payer: Self-pay | Admitting: *Deleted

## 2016-02-08 NOTE — Telephone Encounter (Signed)
Dr. Donnetta Hutching reviewed (on morning of 02-08-2016) results of LE venous reflux study (left leg) which was done on 02-07-2016. Dr. Donnetta Hutching states the LE venous reflux exam was normal----no DVT, no superficial vein thrombosis, no venous incompetence noted in left lower extremity and advised Mrs. Eifert to continue to wear her knee high compression hose and elevate her legs when sitting.  I communicated Dr. Luther Parody recommendations and results of venous reflux study to Mrs. Hoffmeier.  Mrs. Flournoy verbalized understanding.

## 2016-02-14 ENCOUNTER — Encounter: Payer: Self-pay | Admitting: Cardiovascular Disease

## 2016-03-15 ENCOUNTER — Other Ambulatory Visit: Payer: Medicare Other

## 2016-03-18 ENCOUNTER — Ambulatory Visit: Payer: Medicare Other | Admitting: Cardiovascular Disease

## 2016-03-21 ENCOUNTER — Encounter: Payer: Self-pay | Admitting: Podiatry

## 2016-03-21 ENCOUNTER — Ambulatory Visit (INDEPENDENT_AMBULATORY_CARE_PROVIDER_SITE_OTHER): Payer: Medicare Other | Admitting: Podiatry

## 2016-03-21 ENCOUNTER — Ambulatory Visit (INDEPENDENT_AMBULATORY_CARE_PROVIDER_SITE_OTHER): Payer: Medicare Other

## 2016-03-21 VITALS — BP 112/58 | HR 76 | Resp 16

## 2016-03-21 DIAGNOSIS — M779 Enthesopathy, unspecified: Principal | ICD-10-CM

## 2016-03-21 DIAGNOSIS — M778 Other enthesopathies, not elsewhere classified: Secondary | ICD-10-CM

## 2016-03-21 DIAGNOSIS — M2012 Hallux valgus (acquired), left foot: Secondary | ICD-10-CM

## 2016-03-21 DIAGNOSIS — M7752 Other enthesopathy of left foot: Secondary | ICD-10-CM

## 2016-03-21 DIAGNOSIS — L84 Corns and callosities: Secondary | ICD-10-CM | POA: Diagnosis not present

## 2016-03-21 NOTE — Progress Notes (Signed)
   Subjective:    Patient ID: [name], female    DOB: 06/02/1933, 82 y.o.   MRN: [id number]  HPI  Chief Complaint  Patient presents with  . Foot Pain    1st toe and MPJ left - aching for 1 month, callused area plantarly 1st toe, on feet a lot which makes worse       Review of Systems  Hematological: Bruises/bleeds easily.  All other systems reviewed and are negative.      Objective:   Physical Exam        Assessment & Plan:

## 2016-03-22 NOTE — Progress Notes (Signed)
Subjective:     Patient ID: [name], female   DOB: 06/16/1933, 82 y.o.   MRN: [id number]  HPI patient states I'm having a lot of pain under my left big toe and it's been going on now for about 2-3 months and worse over the last month. I do have structural bunion deformity also   Review of Systems  All other systems reviewed and are negative.      Objective:   Physical Exam  Constitutional: She is oriented to person, place, and time.  Cardiovascular: Intact distal pulses.   Musculoskeletal: Normal range of motion.  Neurological: She is oriented to person, place, and time.  Skin: Skin is warm.  Nursing note and vitals reviewed.  neurovascular status intact muscle strength adequate range of motion within normal limits with patient found to have exquisite discomfort plantar aspect left hallux with keratotic lesion formation and pain and hyperostosis medial aspect first metatarsal head left with inflammation and fluid around the joint surface. Patient does walk to the outside of her foot due to the pain and was found to have good digital perfusion and is well oriented 3     Assessment:     Inflammatory keratotic lesion sub-hallux left with pain when palpated and moderate structural bunion deformity left    Plan:     H&P and x-rays reviewed with patient. At this point I did debridement of lesion applied padding to the area and advised on structural bunion correction. Do not recommend surgery and less symptoms were to get worse and we'll see her back if this lesion were to reoccur  X-ray report indicates that the patient has an elevation of the intermetatarsal angle of approximately 13 with a ski jump type hallux

## 2016-03-26 DIAGNOSIS — L245 Irritant contact dermatitis due to other chemical products: Secondary | ICD-10-CM | POA: Diagnosis not present

## 2016-04-05 ENCOUNTER — Other Ambulatory Visit: Payer: Self-pay | Admitting: Cardiovascular Disease

## 2016-04-19 ENCOUNTER — Other Ambulatory Visit (INDEPENDENT_AMBULATORY_CARE_PROVIDER_SITE_OTHER): Payer: Medicare Other | Admitting: *Deleted

## 2016-04-19 DIAGNOSIS — E7800 Pure hypercholesterolemia, unspecified: Secondary | ICD-10-CM | POA: Diagnosis not present

## 2016-04-19 LAB — HEPATIC FUNCTION PANEL
ALT: 23 U/L (ref 6–29)
AST: 22 U/L (ref 10–35)
Albumin: 3.8 g/dL (ref 3.6–5.1)
Alkaline Phosphatase: 56 U/L (ref 33–130)
Bilirubin, Direct: 0.1 mg/dL (ref <=0.2)
Indirect Bilirubin: 0.4 mg/dL (ref 0.2–1.2)
Total Bilirubin: 0.5 mg/dL (ref 0.2–1.2)
Total Protein: 6.2 g/dL (ref 6.1–8.1)

## 2016-04-19 LAB — LIPID PANEL
Cholesterol: 164 mg/dL (ref 125–200)
HDL: 70 mg/dL (ref >=46)
LDL Cholesterol: 84 mg/dL (ref <130)
Total CHOL/HDL Ratio: 2.3 Ratio (ref <=5.0)
Triglycerides: 51 mg/dL (ref <150)
VLDL: 10 mg/dL (ref <30)

## 2016-04-22 ENCOUNTER — Encounter: Payer: Self-pay | Admitting: Cardiovascular Disease

## 2016-04-22 ENCOUNTER — Ambulatory Visit (INDEPENDENT_AMBULATORY_CARE_PROVIDER_SITE_OTHER): Payer: Medicare Other | Admitting: Cardiovascular Disease

## 2016-04-22 VITALS — BP 112/56 | HR 79 | Ht 60.0 in | Wt 104.0 lb

## 2016-04-22 DIAGNOSIS — R600 Localized edema: Secondary | ICD-10-CM

## 2016-04-22 DIAGNOSIS — E782 Mixed hyperlipidemia: Secondary | ICD-10-CM

## 2016-04-22 DIAGNOSIS — R002 Palpitations: Secondary | ICD-10-CM

## 2016-04-22 NOTE — Progress Notes (Signed)
Cardiology Office Note Date:  04/22/2016   ID:  AVACLAIRE KORN, DOB May 30, 1933, MRN [id number]  PCP:  Jerlyn Ly, MD  Cardiologist:  Sherren Mocha, MD    Chief Complaint  Patient presents with  . Hyperlipidemia    History of Present Illness: [name] is a 82 y.o. female who presents for follow-up of hyperlipidemia and strong family history of CAD. She is statin-intolerant and has been treated with Niaspan. She's doing well from a cardiac perspective. Today, she denies symptoms of palpitations, chest pain, shortness of breath, orthopnea, PND, dizziness, or syncope. She has developed left leg swelling and rash and was seen by her dermatologist. He recommended that she undergo evaluation by vascular surgery. She saw Dr. Donnetta Hutching and was felt to have normal arterial circulation. Venous Doppler study showed no DVT. Compression stockings, low-dose Lasix, and leg elevation were recommended. She continues to have problems with left leg swelling despite following these recommendations. She admits she is not wearing her compression stockings regularly but when she does wear them she doesn't appreciate any change in symptoms.  Past Medical History  Diagnosis Date  . Hypercholesterolemia   . Hx of colonic polyps   . Neck pain   . Lumbar back pain   . Osteoporosis   . Cataract   . Swelling of ankle 01/12/16    bilateral. Pt is taking lasix x 2 weeks and wearing compression stockings    Past Surgical History  Procedure Laterality Date  . Lumbar laminectomy  1985    Dr. Gladstone Lighter  . Surgery for removal of rectal polyp  07/1995    Dr. Modena Morrow  . Abdominal hysterectomy  12/2003    DR. Neal--bso, A/P repair  . Cataract surgery    . Tubal ligation      Current Outpatient Prescriptions  Medication Sig Dispense Refill  . aspirin 81 MG tablet Take 81 mg by mouth daily.      . Calcium Carbonate-Vitamin D (CALTRATE 600+D) 600-400 MG-UNIT per tablet Take 1 tablet by mouth 2 (two) times  daily.     . cholecalciferol (VITAMIN D) 1000 UNITS tablet Take 2,000 Units by mouth daily.     . Cyanocobalamin (VITAMIN B 12 PO) Take 1 tablet by mouth daily.    . furosemide (LASIX) 20 MG tablet Take 20 mg by mouth. For a period of 2 weeks for leg swelling per MD . Also using compression stockings    . niacin (NIASPAN) 1000 MG CR tablet TAKE 1 TABLET BY MOUTH EVERY DAY 90 tablet 0  . Omega-3 Fatty Acids (FISH OIL) 300 MG CAPS Take 1 capsule by mouth daily.       No current facility-administered medications for this visit.    Allergies:   Risedronate sodium and Hydrocodone   Social History:  The patient  reports that she quit smoking about 61 years ago. Her smoking use included Cigarettes. She has a .25 pack-year smoking history. She has never used smokeless tobacco. She reports that she drinks about 3.5 oz of alcohol per week. She reports that she does not use illicit drugs.   Family History:  The patient's  family history includes Heart Problems in her brother and sister; Stroke in her mother.    ROS:  Please see the history of present illness.  Otherwise, review of systems is positive for easy bruising.  All other systems are reviewed and negative.    PHYSICAL EXAM: VS:  BP 112/56 mmHg  Pulse 79  Ht  5' (1.524 m)  Wt 104 lb (47.174 kg)  BMI 20.31 kg/m2 , BMI Body mass index is 20.31 kg/(m^2). GEN: Well nourished, well developed, in no acute distress HEENT: normal Neck: no JVD, no masses. No carotid bruits Cardiac: RRR without murmur or gallop                Respiratory:  clear to auscultation bilaterally, normal work of breathing GI: soft, nontender, nondistended, + BS MS: no deformity or atrophy Ext: 2+ brawny left pretibial edema, no edema on the right, pedal pulses 2+= bilaterally Skin: warm and dry, no rash Neuro:  Strength and sensation are intact Psych: euthymic mood, full affect  EKG:  EKG is ordered today. The ekg ordered today shows normal sinus rhythm 79 bpm,  nonspecific ST abnormality.  Recent Labs: 04/19/2016: ALT 23   Lipid Panel     Component Value Date/Time   CHOL 164 04/19/2016 0736   TRIG 51 04/19/2016 0736   HDL 70 04/19/2016 0736   CHOLHDL 2.3 04/19/2016 0736   VLDL 10 04/19/2016 0736   LDLCALC 84 04/19/2016 0736   LDLDIRECT 158.8 11/14/2011 0733      Wt Readings from Last 3 Encounters:  04/22/16 104 lb (47.174 kg)  02/06/16 107 lb (48.535 kg)  01/12/16 104 lb 6.4 oz (47.356 kg)    ASSESSMENT AND PLAN: 1.  Mixed hyperlipidemia: Recent lipids reviewed and she has continued improvement in her lipid panel on a combination of Niaspan and fish oil. LDL cholesterol is down to 84 mg/dL. She has tolerating therapy well with no side effects. She will continue current medicines and follow-up in one year.  2. Left leg swelling: I encouraged her to follow-up with Dr. Donnetta Hutching. Reviewed his notes and may consider saphenous vein ablative therapies.  Current medicines are reviewed with the patient today.  The patient does not have concerns regarding medicines.  Labs/ tests ordered today include:  No orders of the defined types were placed in this encounter.    Disposition:   FU one year  Signed, Sherren Mocha, MD  04/22/2016 1:54 PM    [facility], [place], [place]  [address] Phone: [phone]; Fax: [phone]

## 2016-04-22 NOTE — Patient Instructions (Signed)
Medication Instructions:  Your physician recommends that you continue on your current medications as directed. Please refer to the Current Medication list given to you today.  Labwork: Your physician recommends that you return for a FASTING LIPID and LIVER in 1 YEAR--nothing to eat or drink after midnight, lab opens at 7:30 AM.   Testing/Procedures: No new orders.   Follow-Up: Your physician wants you to follow-up in: 1 YEAR with Dr Cooper. You will receive a reminder letter in the mail two months in advance. If you don't receive a letter, please call our office to schedule the follow-up appointment.   Any Other Special Instructions Will Be Listed Below (If Applicable).     If you need a refill on your cardiac medications before your next appointment, please call your pharmacy.   

## 2016-05-02 ENCOUNTER — Telehealth: Payer: Self-pay | Admitting: *Deleted

## 2016-05-02 NOTE — Telephone Encounter (Signed)
Returning [name] earlier phone call from today which was forwarded from [facility] triage nurse.   [name] was seen by Selena Jews MD on 02-06-2016 for left leg swelling.  She had negative venous reflux study ( no venous incompetence of left leg and no evidence of DVT) on 02-07-2016.  I communicated the venous reflux results to [name] on 02-08-2016 along with [name] recommendations to continue wearing knee high compression hose and elevate her legs when sitting.  Today, [name] says she has seen no improvement in left leg swelling and [name] (her cardiologist) has discontinued Lasix.  She reported that she has stopped wearing her compression hose "since it is so hot."  Conveyed that it is important to continue elevation of legs and to wear compression hose during daytime to help reduce leg swelling.  Explained that with negative venous reflux study she is not a candidate for laser ablation procedure.  Offered to make appointment with [name] for re-evaluation.  [name] states she will call me back to schedule an appointment.

## 2016-05-03 ENCOUNTER — Telehealth: Payer: Self-pay | Admitting: *Deleted

## 2016-05-03 NOTE — Telephone Encounter (Signed)
Following our telephone conversation yesterday, [name] is requesting an appointment with Dr. Donnetta Hutching to discuss continued left leg swelling/no improvement/ treatment options.  Follow up appointment made with Dr. Donnetta Hutching on 05-23-2016 at 10:15AM.  [name] is aware of appointment date and time.

## 2016-05-17 ENCOUNTER — Encounter: Payer: Self-pay | Admitting: Vascular Surgery

## 2016-05-23 ENCOUNTER — Encounter: Payer: Self-pay | Admitting: Vascular Surgery

## 2016-05-23 ENCOUNTER — Ambulatory Visit (INDEPENDENT_AMBULATORY_CARE_PROVIDER_SITE_OTHER): Payer: Medicare Other | Admitting: Vascular Surgery

## 2016-05-23 VITALS — BP 124/63 | HR 73 | Temp 97.4°F | Resp 14 | Ht 60.0 in | Wt 104.0 lb

## 2016-05-23 DIAGNOSIS — I890 Lymphedema, not elsewhere classified: Secondary | ICD-10-CM | POA: Diagnosis not present

## 2016-05-23 NOTE — Progress Notes (Signed)
[facility] [place]  Patient name: [name] MRN: [id number] DOB: 26-Jan-1933 Sex: female  REASON FOR VISIT: Follow-up of left leg swelling  HPI: [name] is a 82 y.o. female here today for continued discussion of her left leg swelling. She had been seen several months ago. At that time showed formal duplex evaluation showing no evidence of DVT and no evidence of significant venous reflux in her superficial or deep system. He has had persistent swelling. She does not tolerate the compression feeling no it constricts her proximal calf.  Past Medical History  Diagnosis Date  . Hypercholesterolemia   . Hx of colonic polyps   . Neck pain   . Lumbar back pain   . Osteoporosis   . Cataract   . Swelling of ankle 01/12/16    bilateral. Pt is taking lasix x 2 weeks and wearing compression stockings    Family History  Problem Relation Age of Onset  . Heart Problems Sister     triple bipass  . Heart Problems Brother     blocked arteries, stent.  . Stroke Mother     SOCIAL HISTORY: Social History  Substance Use Topics  . Smoking status: Former Smoker -- 0.25 packs/day for 1 years    Types: Cigarettes    Quit date: 11/11/1954  . Smokeless tobacco: Never Used  . Alcohol Use: 4.2 oz/week    7 Standard drinks or equivalent per week     Comment: wine with dinner    Allergies  Allergen Reactions  . Risedronate Sodium Other (See Comments)    REACTION: INTOL to Actonel  . Hydrocodone Nausea Only    REACTION: nausea    Current Outpatient Prescriptions  Medication Sig Dispense Refill  . aspirin 81 MG tablet Take 81 mg by mouth daily.      . Calcium Carbonate-Vitamin D (CALTRATE 600+D) 600-400 MG-UNIT per tablet Take 1 tablet by mouth 2 (two) times daily.     . cholecalciferol (VITAMIN D) 1000 UNITS tablet Take 2,000 Units by mouth daily.     . Cyanocobalamin (VITAMIN B 12 PO) Take 1 tablet by mouth daily.    .  niacin (NIASPAN) 1000 MG CR tablet TAKE 1 TABLET BY MOUTH EVERY DAY 90 tablet 0  . Omega-3 Fatty Acids (FISH OIL) 300 MG CAPS Take 1 capsule by mouth daily.      . furosemide (LASIX) 20 MG tablet Take 20 mg by mouth. Reported on 05/23/2016     No current facility-administered medications for this visit.    REVIEW OF SYSTEMS:  [X]  denotes positive finding, [ ]  denotes negative finding Cardiac  Comments:  Chest pain or chest pressure:    Shortness of breath upon exertion:    Short of breath when lying flat:    Irregular heart rhythm:        Vascular    Pain in calf, thigh, or hip brought on by ambulation:    Pain in feet at night that wakes you up from your sleep:     Blood clot in your veins:    Leg swelling:  x       Pulmonary    Oxygen at home:    Productive cough:     Wheezing:         Neurologic    Sudden weakness in arms or legs:     Sudden numbness in arms or legs:     Sudden onset of difficulty speaking or slurred  speech:    Temporary loss of vision in one eye:     Problems with dizziness:         Gastrointestinal    Blood in stool:     Vomited blood:         Genitourinary    Burning when urinating:     Blood in urine:        Psychiatric    Major depression:         Hematologic    Bleeding problems:    Problems with blood clotting too easily:        Skin    Rashes or ulcers:        Constitutional    Fever or chills:      PHYSICAL EXAM: Filed Vitals:   05/23/16 1030  BP: 124/63  Pulse: 73  Temp: 97.4 F (36.3 C)  Resp: 14  Height: 5' (1.524 m)  Weight: 104 lb (47.174 kg)  SpO2: 100%    GENERAL: The patient is a well-nourished female, in no acute distress. The vital signs are documented above. VASCULAR: 2+ femoral and 2+ dorsalis pedis pulse on the left. No evidence of lymphadenopathy PULMONARY: There is good air exchange  MUSCULOSKELETAL: There are no major deformities or cyanosis. NEUROLOGIC: No focal weakness or paresthesias are  detected. SKIN: There are no ulcers or rashes noted. PSYCHIATRIC: The patient has a normal affect. Does have swelling in her left leg from her knee down onto the dorsum of her foot. Does not extend onto her toes. The area over the pretibial area has orange peel appearance.   MEDICAL ISSUES: Again discussed this with patient. I feel that her physical exam is most consistent with lymphedema. Explained treatment options are elevation compression and potentially therapeutic massage with lymphedema specialist. Explained that this is not limb threatening good would probably be progressive over time. We'll refer her to lymphedema therapist for further discussion. She is having somewhat difficult time now with caring for her husband with recent back surgery. She will continue to attempt compression and see [place] on an as-needed basis    Rosetta Posner, MD [facility] Tel [phone] Pager [phone]

## 2016-06-11 DIAGNOSIS — L309 Dermatitis, unspecified: Secondary | ICD-10-CM | POA: Diagnosis not present

## 2016-06-11 DIAGNOSIS — Z6820 Body mass index (BMI) 20.0-20.9, adult: Secondary | ICD-10-CM | POA: Diagnosis not present

## 2016-07-04 DIAGNOSIS — Z1231 Encounter for screening mammogram for malignant neoplasm of breast: Secondary | ICD-10-CM | POA: Diagnosis not present

## 2016-07-04 DIAGNOSIS — Z01419 Encounter for gynecological examination (general) (routine) without abnormal findings: Secondary | ICD-10-CM | POA: Diagnosis not present

## 2016-07-04 DIAGNOSIS — N390 Urinary tract infection, site not specified: Secondary | ICD-10-CM | POA: Diagnosis not present

## 2016-07-04 DIAGNOSIS — Z6821 Body mass index (BMI) 21.0-21.9, adult: Secondary | ICD-10-CM | POA: Diagnosis not present

## 2016-07-09 ENCOUNTER — Other Ambulatory Visit: Payer: Self-pay | Admitting: Cardiovascular Disease

## 2016-07-10 ENCOUNTER — Encounter: Payer: Self-pay | Admitting: Podiatry

## 2016-07-10 ENCOUNTER — Ambulatory Visit (INDEPENDENT_AMBULATORY_CARE_PROVIDER_SITE_OTHER): Payer: Medicare Other | Admitting: Podiatry

## 2016-07-10 ENCOUNTER — Other Ambulatory Visit (HOSPITAL_COMMUNITY): Payer: Self-pay | Admitting: Internal Medicine

## 2016-07-10 DIAGNOSIS — B078 Other viral warts: Secondary | ICD-10-CM

## 2016-07-10 DIAGNOSIS — B079 Viral wart, unspecified: Secondary | ICD-10-CM

## 2016-07-10 DIAGNOSIS — M2012 Hallux valgus (acquired), left foot: Secondary | ICD-10-CM

## 2016-07-10 NOTE — Progress Notes (Signed)
Subjective:     Patient ID: [name], female   DOB: May 16, 1933, 82 y.o.   MRN: [id number]  HPI patient presents with continued pain in the interphalangeal joint of the big toe left   Review of Systems     Objective:   Physical Exam Neurovascular status intact with patient stating she only got 3 or 4 weeks or relieve the last treatment    Assessment:     Deep keratotic lesion left with also hyperextension and changes of the interphalangeal joint    Plan:     Reviewed condition at great length and discussed the changes of the interphalangeal joint and that it may require fusion of the big toe joint if symptoms persist or not able to get her better. Debrided the lesion today and I did apply chemical agent to create an immune response to see if this will make a difference and I applied sterile dressing

## 2016-07-12 DIAGNOSIS — Z23 Encounter for immunization: Secondary | ICD-10-CM | POA: Diagnosis not present

## 2016-07-16 ENCOUNTER — Encounter (HOSPITAL_COMMUNITY): Payer: Self-pay

## 2016-07-16 ENCOUNTER — Ambulatory Visit (HOSPITAL_COMMUNITY)
Admission: RE | Admit: 2016-07-16 | Discharge: 2016-07-16 | Disposition: A | Discharge status: Home / Self Care | Payer: Medicare Other | Source: Ambulatory Visit | Attending: Internal Medicine | Admitting: Internal Medicine

## 2016-07-16 DIAGNOSIS — M810 Age-related osteoporosis without current pathological fracture: Secondary | ICD-10-CM | POA: Insufficient documentation

## 2016-07-16 MED ORDER — DENOSUMAB 60 MG/ML ~~LOC~~ SOLN
60.0000 mg | Freq: Once | SUBCUTANEOUS | Status: AC
  Administered 2016-07-16: 60 mg via SUBCUTANEOUS
  Filled 2016-07-16: qty 1

## 2016-07-16 NOTE — Discharge Instructions (Signed)
PROLIA °Denosumab injection °What is this medicine? °DENOSUMAB (den oh sue mab) slows bone breakdown. Prolia is used to treat osteoporosis in women after menopause and in men. Xgeva is used to prevent bone fractures and other bone problems caused by cancer bone metastases. Xgeva is also used to treat giant cell tumor of the bone. °This medicine may be used for other purposes; ask your health care provider or pharmacist if you have questions. °What should I tell my health care provider before I take this medicine? °They need to know if you have any of these conditions: °-dental disease °-eczema °-infection or history of infections °-kidney disease or on dialysis °-low blood calcium or vitamin D °-malabsorption syndrome °-scheduled to have surgery or tooth extraction °-taking medicine that contains denosumab °-thyroid or parathyroid disease °-an unusual reaction to denosumab, other medicines, foods, dyes, or preservatives °-pregnant or trying to get pregnant °-breast-feeding °How should I use this medicine? °This medicine is for injection under the skin. It is given by a health care professional in a hospital or clinic setting. °If you are getting Prolia, a special MedGuide will be given to you by the pharmacist with each prescription and refill. Be sure to read this information carefully each time. °For Prolia, talk to your pediatrician regarding the use of this medicine in children. Special care may be needed. For Xgeva, talk to your pediatrician regarding the use of this medicine in children. While this drug may be prescribed for children as young as 13 years for selected conditions, precautions do apply. °Overdosage: If you think you have taken too much of this medicine contact a poison control center or emergency room at once. °NOTE: This medicine is only for you. Do not share this medicine with others. °What if I miss a dose? °It is important not to miss your dose. Call your doctor or health care professional if  you are unable to keep an appointment. °What may interact with this medicine? °Do not take this medicine with any of the following medications: °-other medicines containing denosumab °This medicine may also interact with the following medications: °-medicines that suppress the immune system °-medicines that treat cancer °-steroid medicines like prednisone or cortisone °This list may not describe all possible interactions. Give your health care provider a list of all the medicines, herbs, non-prescription drugs, or dietary supplements you use. Also tell them if you smoke, drink alcohol, or use illegal drugs. Some items may interact with your medicine. °What should I watch for while using this medicine? °Visit your doctor or health care professional for regular checks on your progress. Your doctor or health care professional may order blood tests and other tests to see how you are doing. °Call your doctor or health care professional if you get a cold or other infection while receiving this medicine. Do not treat yourself. This medicine may decrease your body's ability to fight infection. °You should make sure you get enough calcium and vitamin D while you are taking this medicine, unless your doctor tells you not to. Discuss the foods you eat and the vitamins you take with your health care professional. °See your dentist regularly. Brush and floss your teeth as directed. Before you have any dental work done, tell your dentist you are receiving this medicine. °Do not become pregnant while taking this medicine or for 5 months after stopping it. Women should inform their doctor if they wish to become pregnant or think they might be pregnant. There is a potential for serious side   effects to an unborn child. Talk to your health care professional or pharmacist for more information. °What side effects may I notice from receiving this medicine? °Side effects that you should report to your doctor or health care professional as  soon as possible: °-allergic reactions like skin rash, itching or hives, swelling of the face, lips, or tongue °-breathing problems °-chest pain °-fast, irregular heartbeat °-feeling faint or lightheaded, falls °-fever, chills, or any other sign of infection °-muscle spasms, tightening, or twitches °-numbness or tingling °-skin blisters or bumps, or is dry, peels, or red °-slow healing or unexplained pain in the mouth or jaw °-unusual bleeding or bruising °Side effects that usually do not require medical attention (Report these to your doctor or health care professional if they continue or are bothersome.): °-muscle pain °-stomach upset, gas °This list may not describe all possible side effects. Call your doctor for medical advice about side effects. You may report side effects to FDA at 1-800-FDA-1088. °Where should I keep my medicine? °This medicine is only given in a clinic, doctor's office, or other health care setting and will not be stored at home. °NOTE: This sheet is a summary. It may not cover all possible information. If you have questions about this medicine, talk to your doctor, pharmacist, or health care provider. °  °© 2016, Elsevier/Gold Standard. (2012-04-27 12:37:47) °Osteoporosis °Osteoporosis is the thinning and loss of density in the bones. Osteoporosis makes the bones more brittle, fragile, and likely to break (fracture). Over time, osteoporosis can cause the bones to become so weak that they fracture after a simple fall. The bones most likely to fracture are the bones in the hip, wrist, and spine. °CAUSES  °The exact cause is not known. °RISK FACTORS °Anyone can develop osteoporosis. You may be at greater risk if you have a family history of the condition or have poor nutrition. You may also have a higher risk if you are:  °· Female.   °· 80 years old or older. °· A smoker. °· Not physically active.   °· White or Asian. °· Slender. °SIGNS AND SYMPTOMS  °A fracture might be the first sign of the  disease, especially if it results from a fall or injury that would not usually cause a bone to break. Other signs and symptoms include:  °· Low back and neck pain. °· Stooped posture. °· Height loss. °DIAGNOSIS  °To make a diagnosis, your health care provider may: °· Take a medical history. °· Perform a physical exam. °· Order tests, such as: °¨ A bone mineral density test. °¨ A dual-energy X-ray absorptiometry test. °TREATMENT  °The goal of osteoporosis treatment is to strengthen your bones to reduce your risk of a fracture. Treatment may involve: °· Making lifestyle changes, such as: °¨ Eating a diet rich in calcium. °¨ Doing weight-bearing and muscle-strengthening exercises. °¨ Stopping tobacco use. °¨ Limiting alcohol intake. °· Taking medicine to slow the process of bone loss or to increase bone density. °· Monitoring your levels of calcium and vitamin D. °HOME CARE INSTRUCTIONS °· Include calcium and vitamin D in your diet. Calcium is important for bone health, and vitamin D helps the body absorb calcium. °· Perform weight-bearing and muscle-strengthening exercises as directed by your health care provider. °· Do not use any tobacco products, including cigarettes, chewing tobacco, and electronic cigarettes. If you need help quitting, ask your health care provider. °· Limit your alcohol intake. °· Take medicines only as directed by your health care provider. °· Keep all   follow-up visits as directed by your health care provider. This is important. °· Take precautions at home to lower your risk of falling, such as: °¨ Keeping rooms well lit and clutter free. °¨ Installing safety rails on stairs. °¨ Using rubber mats in the bathroom and other areas that are often wet or slippery. °SEEK IMMEDIATE MEDICAL CARE IF:  °You fall or injure yourself.  °  °This information is not intended to replace advice given to you by your health care provider. Make sure you discuss any questions you have with your health care  provider. °  °Document Released: 08/07/2005 Document Revised: 11/18/2014 Document Reviewed: 04/07/2014 °Elsevier Interactive Patient Education ©2016 Elsevier Inc. ° ° °

## 2016-07-30 ENCOUNTER — Other Ambulatory Visit: Payer: Self-pay | Admitting: *Deleted

## 2016-07-30 DIAGNOSIS — I890 Lymphedema, not elsewhere classified: Secondary | ICD-10-CM

## 2016-08-07 ENCOUNTER — Ambulatory Visit (INDEPENDENT_AMBULATORY_CARE_PROVIDER_SITE_OTHER): Payer: Medicare Other | Admitting: Podiatry

## 2016-08-07 ENCOUNTER — Encounter: Payer: Self-pay | Admitting: Podiatry

## 2016-08-07 DIAGNOSIS — B079 Viral wart, unspecified: Secondary | ICD-10-CM | POA: Diagnosis not present

## 2016-08-07 DIAGNOSIS — L84 Corns and callosities: Secondary | ICD-10-CM | POA: Diagnosis not present

## 2016-08-07 DIAGNOSIS — B078 Other viral warts: Secondary | ICD-10-CM

## 2016-08-08 NOTE — Progress Notes (Signed)
Subjective:     Patient ID: [name], female   DOB: June 28, 1933, 82 y.o.   MRN: [id number]  HPI patient states my left toe is improving quite a bit but it is still somewhat painful but the lesion fell off   Review of Systems     Objective:   Physical Exam Neurovascular status intact with keratotic lesion on the left big toe which has improved but has still giving her some problems    Assessment:     Verruca plantaris left which is improved but present    Plan:     Debrided the lesion do not recommend further chemical treatment but do recommend padding and patient will be seen back as needed

## 2016-08-28 ENCOUNTER — Ambulatory Visit (INDEPENDENT_AMBULATORY_CARE_PROVIDER_SITE_OTHER): Payer: Medicare Other | Admitting: Podiatry

## 2016-08-28 ENCOUNTER — Encounter: Payer: Self-pay | Admitting: Podiatry

## 2016-08-28 VITALS — BP 138/71 | HR 59 | Resp 16

## 2016-08-28 DIAGNOSIS — B070 Plantar wart: Secondary | ICD-10-CM | POA: Diagnosis not present

## 2016-08-28 DIAGNOSIS — L600 Ingrowing nail: Secondary | ICD-10-CM | POA: Diagnosis not present

## 2016-08-28 NOTE — Progress Notes (Signed)
Subjective:     Patient ID: [name], female   DOB: March 03, 1933, 82 y.o.   MRN: [id number]  HPI patient presents stating her right fifth digit is somewhat bothersome and there is a nail that is falling off   Review of Systems     Objective:   Physical Exam  Neurovascular status was found to be intact with patient found to have a damaged fifth nail right that's thick dystrophic and moderately painful when palpated. Also has some discoloration on the dorsum of the foot    Assessment:     Damaged right fifth nail with looseness to the bed itself with mild dermatitis dorsum foot    Plan:     H&P condition reviewed and recommended nail removal. I infiltrated with a anesthetic and under sterile conditions remove the nail and applied dressing to the toe. Begin cortisone cream on top of the foot and reappoint as needed if any issues should arise

## 2016-11-22 DIAGNOSIS — D7589 Other specified diseases of blood and blood-forming organs: Secondary | ICD-10-CM | POA: Diagnosis not present

## 2016-11-22 DIAGNOSIS — M810 Age-related osteoporosis without current pathological fracture: Secondary | ICD-10-CM | POA: Diagnosis not present

## 2016-11-22 DIAGNOSIS — E784 Other hyperlipidemia: Secondary | ICD-10-CM | POA: Diagnosis not present

## 2016-11-26 DIAGNOSIS — H524 Presbyopia: Secondary | ICD-10-CM | POA: Diagnosis not present

## 2016-11-26 DIAGNOSIS — H52223 Regular astigmatism, bilateral: Secondary | ICD-10-CM | POA: Diagnosis not present

## 2016-11-26 DIAGNOSIS — H5212 Myopia, left eye: Secondary | ICD-10-CM | POA: Diagnosis not present

## 2016-11-26 DIAGNOSIS — Z961 Presence of intraocular lens: Secondary | ICD-10-CM | POA: Diagnosis not present

## 2016-11-26 DIAGNOSIS — H43813 Vitreous degeneration, bilateral: Secondary | ICD-10-CM | POA: Diagnosis not present

## 2016-12-05 ENCOUNTER — Ambulatory Visit (INDEPENDENT_AMBULATORY_CARE_PROVIDER_SITE_OTHER): Payer: Medicare Other

## 2016-12-05 ENCOUNTER — Ambulatory Visit (INDEPENDENT_AMBULATORY_CARE_PROVIDER_SITE_OTHER): Payer: Medicare Other | Admitting: Physician Assistant

## 2016-12-05 VITALS — BP 132/62 | HR 68 | Temp 97.4°F | Resp 16 | Wt 106.6 lb

## 2016-12-05 DIAGNOSIS — M50121 Cervical disc disorder at C4-C5 level with radiculopathy: Secondary | ICD-10-CM | POA: Diagnosis not present

## 2016-12-05 DIAGNOSIS — R200 Anesthesia of skin: Secondary | ICD-10-CM | POA: Diagnosis not present

## 2016-12-05 DIAGNOSIS — G8929 Other chronic pain: Secondary | ICD-10-CM | POA: Diagnosis not present

## 2016-12-05 DIAGNOSIS — M542 Cervicalgia: Secondary | ICD-10-CM

## 2016-12-05 LAB — POCT CBC
Granulocyte percent: 61.4 %G (ref 37–80)
HCT, POC: 38.4 % (ref 37.7–47.9)
Hemoglobin: 13.6 g/dL (ref 12.2–16.2)
Lymph, poc: 1.9 (ref 0.6–3.4)
MCH, POC: 33.5 pg — AB (ref 27–31.2)
MCHC: 35.4 g/dL (ref 31.8–35.4)
MCV: 94.7 fL (ref 80–97)
MID (cbc): 0.6 (ref 0–0.9)
MPV: 7.5 fL (ref 0–99.8)
POC Granulocyte: 4.1 (ref 2–6.9)
POC LYMPH PERCENT: 29.1 %L (ref 10–50)
POC MID %: 9.5 %M (ref 0–12)
Platelet Count, POC: 212 10*3/uL (ref 142–424)
RBC: 4.06 M/uL (ref 4.04–5.48)
RDW, POC: 13.2 %
WBC: 6.7 10*3/uL (ref 4.6–10.2)

## 2016-12-05 LAB — TROPONIN I: Troponin I: <0.01 ng/mL (ref 0.00–0.04)

## 2016-12-05 NOTE — Progress Notes (Signed)
12/05/2016 10:07 AM   DOB: May 05, 1933 / MRN: [id number]  SUBJECTIVE:  [name] is a 83 y.o. female presenting for left arm numbness that she describes as "it feels dead" that started yesterday.She has been having neck pain and HA the "last few days."  Associates indigestion however it is normal for her to have. She has a previous medical history of dyslipidemia, DJD and neck pain. Previous smoker. Denies weakness, gait changes and confusion. Takes ASA 81 daily along with niacin and omega 3. Distant history of smoking for a short time. She sees cardiology yearly for a family history of CAD but has never had a bad report from Dr. Burt Knack and last year she was told by him "your blood work looks amazing."   She denies SOB, pleuritic pain, dizziness.   Immunization History  Administered Date(s) Administered  . Influenza Split 09/09/2011, 07/26/2013  . Influenza Whole 07/12/2012  . Influenza,inj,Quad PF,36+ Mos 08/10/2014     She is allergic to risedronate sodium and hydrocodone.   She  has a past medical history of Cataract; colonic polyps; Hypercholesterolemia; Lumbar back pain; Neck pain; Osteoporosis; and Swelling of ankle (01/12/16).    She  reports that she quit smoking about 62 years ago. Her smoking use included Cigarettes. She has a 0.25 pack-year smoking history. She has never used smokeless tobacco. She reports that she drinks about 4.2 oz of alcohol per week . She reports that she does not use drugs. She  reports that she does not engage in sexual activity. The patient  has a past surgical history that includes Lumbar laminectomy (1985); surgery for removal of rectal polyp (07/1995); Abdominal hysterectomy (12/2003); cataract surgery; and Tubal ligation.  Her family history includes Heart Problems in her brother and sister; Stroke in her mother.  Review of Systems  Respiratory: Negative for cough and shortness of breath.   Cardiovascular: Negative for chest pain and leg swelling.   Gastrointestinal: Negative for nausea.  Musculoskeletal: Positive for myalgias and neck pain. Negative for back pain, falls and joint pain.  Skin: Negative for rash.  Neurological: Positive for headaches. Negative for dizziness.    The problem list and medications were reviewed and updated by myself where necessary and exist elsewhere in the encounter.   OBJECTIVE:  BP 132/62 (Cuff Size: Normal)   Pulse 68   Temp 97.4 F (36.3 C)   Resp 16   Wt 106 lb 9.6 oz (48.4 kg)   SpO2 99%   BMI 20.82 kg/m   Physical Exam  Constitutional: She is oriented to person, place, and time. She appears well-developed and well-nourished. No distress.  Neck: Normal range of motion.  Cardiovascular: Normal rate, regular rhythm, normal heart sounds and intact distal pulses.  Exam reveals no gallop and no friction rub.   No murmur heard. Pulmonary/Chest: Effort normal. She has no wheezes. She has no rales. She exhibits no tenderness.  Musculoskeletal: She exhibits tenderness (left upper trapezius).  Neurological: She is alert and oriented to person, place, and time. She has normal reflexes. She displays normal reflexes. No cranial nerve deficit. She exhibits normal muscle tone. Coordination normal.  Skin: Skin is warm and dry. No rash noted. She is not diaphoretic.  Psychiatric: She has a normal mood and affect.    Lab Results  Component Value Date   CHOL 164 04/19/2016   HDL 70 04/19/2016   LDLCALC 84 04/19/2016   LDLDIRECT 158.8 11/14/2011   TRIG 51 04/19/2016   CHOLHDL 2.3 04/19/2016  Results for orders placed or performed in visit on 12/05/16 (from the past 72 hour(s))  POCT CBC     Status: Abnormal   Collection Time: 12/05/16  9:56 AM  Result Value Ref Range   WBC 6.7 4.6 - 10.2 K/uL   Lymph, poc 1.9 0.6 - 3.4   POC LYMPH PERCENT 29.1 10 - 50 %L   MID (cbc) 0.6 0 - 0.9   POC MID % 9.5 0 - 12 %M   POC Granulocyte 4.1 2 - 6.9   Granulocyte percent 61.4 37 - 80 %G   RBC 4.06 4.04 -  5.48 M/uL   Hemoglobin 13.6 12.2 - 16.2 g/dL   HCT, POC 38.4 37.7 - 47.9 %   MCV 94.7 80 - 97 fL   MCH, POC 33.5 (A) 27 - 31.2 pg   MCHC 35.4 31.8 - 35.4 g/dL   RDW, POC 13.2 %   Platelet Count, POC 212 142 - 424 K/uL   MPV 7.5 0 - 99.8 fL   Lab Results  Component Value Date   CREATININE 0.9 11/19/2012   BUN 17 11/19/2012   NA 139 11/19/2012   K 4.1 11/19/2012   CL 105 11/19/2012   CO2 28 11/19/2012   No results found.   ASSESSMENT AND PLAN:  Diagnoses and all orders for this visit:  Left arm numbness: [phone]. She is concerned this is her heart. Her EKG is normal and she is not having any chest pain or SOB.   Her symptoms and exam point towards a cervical radiculopathy. She has no strength deficit and her neurological exam is reassuring. Given her concern I will rule in/out an acute cardiac event with a troponin as I am certain this is not an ischemic event. I do not like NSAIDS given her age.  Will await Troponin and she would likely do very well on prednisone however patient does not want to pursue this at this time.  She will take tylenol and RTC if she is not improving.    -     EKG 12-Lead -     DG Cervical Spine 2 or 3 views; Future  Neck pain of over 3 months duration: Chronic.     The patient is advised to call or return to clinic if she does not see an improvement in symptoms, or to seek the care of the closest emergency department if she worsens with the above plan.   Philis Fendt, MHS, PA-C [facility] 12/05/2016 10:07 AM

## 2016-12-05 NOTE — Patient Instructions (Signed)
     IF you received an x-ray today, you will receive an invoice from [facility]. Please contact  Radiology at [phone] with questions or concerns regarding your invoice.   IF you received labwork today, you will receive an invoice from [facility]. Please contact [facility] at [phone] with questions or concerns regarding your invoice.   Our billing staff will not be able to assist you with questions regarding bills from these companies.  You will be contacted with the lab results as soon as they are available. The fastest way to get your results is to activate your My Chart account. Instructions are located on the last page of this paperwork. If you have not heard from us regarding the results in 2 weeks, please contact this office.     

## 2016-12-18 DIAGNOSIS — L308 Other specified dermatitis: Secondary | ICD-10-CM | POA: Diagnosis not present

## 2016-12-18 DIAGNOSIS — B029 Zoster without complications: Secondary | ICD-10-CM | POA: Diagnosis not present

## 2016-12-18 DIAGNOSIS — Z780 Asymptomatic menopausal state: Secondary | ICD-10-CM | POA: Diagnosis not present

## 2016-12-18 DIAGNOSIS — Z0000 Encounter for general adult medical examination without abnormal findings: Secondary | ICD-10-CM | POA: Diagnosis not present

## 2016-12-18 DIAGNOSIS — R600 Localized edema: Secondary | ICD-10-CM | POA: Diagnosis not present

## 2016-12-18 DIAGNOSIS — Z6821 Body mass index (BMI) 21.0-21.9, adult: Secondary | ICD-10-CM | POA: Diagnosis not present

## 2016-12-18 DIAGNOSIS — Z1211 Encounter for screening for malignant neoplasm of colon: Secondary | ICD-10-CM | POA: Diagnosis not present

## 2016-12-18 DIAGNOSIS — M810 Age-related osteoporosis without current pathological fracture: Secondary | ICD-10-CM | POA: Diagnosis not present

## 2016-12-18 DIAGNOSIS — D7589 Other specified diseases of blood and blood-forming organs: Secondary | ICD-10-CM | POA: Diagnosis not present

## 2016-12-18 DIAGNOSIS — E784 Other hyperlipidemia: Secondary | ICD-10-CM | POA: Diagnosis not present

## 2016-12-18 DIAGNOSIS — I872 Venous insufficiency (chronic) (peripheral): Secondary | ICD-10-CM | POA: Diagnosis not present

## 2016-12-18 DIAGNOSIS — Z1389 Encounter for screening for other disorder: Secondary | ICD-10-CM | POA: Diagnosis not present

## 2016-12-26 DIAGNOSIS — N952 Postmenopausal atrophic vaginitis: Secondary | ICD-10-CM | POA: Diagnosis not present

## 2016-12-26 DIAGNOSIS — R351 Nocturia: Secondary | ICD-10-CM | POA: Diagnosis not present

## 2017-01-14 DIAGNOSIS — L570 Actinic keratosis: Secondary | ICD-10-CM | POA: Diagnosis not present

## 2017-01-14 DIAGNOSIS — L821 Other seborrheic keratosis: Secondary | ICD-10-CM | POA: Diagnosis not present

## 2017-01-14 DIAGNOSIS — D1801 Hemangioma of skin and subcutaneous tissue: Secondary | ICD-10-CM | POA: Diagnosis not present

## 2017-01-14 DIAGNOSIS — L820 Inflamed seborrheic keratosis: Secondary | ICD-10-CM | POA: Diagnosis not present

## 2017-01-15 ENCOUNTER — Ambulatory Visit (HOSPITAL_COMMUNITY)
Admission: RE | Admit: 2017-01-15 | Discharge: 2017-01-15 | Disposition: A | Discharge status: Home / Self Care | Payer: Medicare Other | Source: Ambulatory Visit | Attending: Internal Medicine | Admitting: Internal Medicine

## 2017-01-15 ENCOUNTER — Encounter (HOSPITAL_COMMUNITY): Payer: Self-pay

## 2017-01-15 DIAGNOSIS — M810 Age-related osteoporosis without current pathological fracture: Secondary | ICD-10-CM | POA: Diagnosis not present

## 2017-01-15 MED ORDER — DENOSUMAB 60 MG/ML ~~LOC~~ SOLN
60.0000 mg | Freq: Once | SUBCUTANEOUS | Status: AC
  Administered 2017-01-15: 60 mg via SUBCUTANEOUS
  Filled 2017-01-15: qty 1

## 2017-01-15 NOTE — Progress Notes (Signed)
Pt did get appt scheduled for 6 month injection

## 2017-01-15 NOTE — Discharge Instructions (Signed)
Denosumab injection/Prolia Injection  °What is this medicine? °DENOSUMAB (den oh sue mab) slows bone breakdown. Prolia is used to treat osteoporosis in women after menopause and in men. Xgeva is used to treat a high calcium level due to cancer and to prevent bone fractures and other bone problems caused by multiple myeloma or cancer bone metastases. Xgeva is also used to treat giant cell tumor of the bone. °This medicine may be used for other purposes; ask your health care provider or pharmacist if you have questions. °COMMON BRAND NAME(S): Prolia, XGEVA °What should I tell my health care provider before I take this medicine? °They need to know if you have any of these conditions: °-dental disease °-having surgery or tooth extraction °-infection °-kidney disease °-low levels of calcium or Vitamin D in the blood °-malnutrition °-on hemodialysis °-skin conditions or sensitivity °-thyroid or parathyroid disease °-an unusual reaction to denosumab, other medicines, foods, dyes, or preservatives °-pregnant or trying to get pregnant °-breast-feeding °How should I use this medicine? °This medicine is for injection under the skin. It is given by a health care professional in a hospital or clinic setting. °If you are getting Prolia, a special MedGuide will be given to you by the pharmacist with each prescription and refill. Be sure to read this information carefully each time. °For Prolia, talk to your pediatrician regarding the use of this medicine in children. Special care may be needed. For Xgeva, talk to your pediatrician regarding the use of this medicine in children. While this drug may be prescribed for children as young as 13 years for selected conditions, precautions do apply. °Overdosage: If you think you have taken too much of this medicine contact a poison control center or emergency room at once. °NOTE: This medicine is only for you. Do not share this medicine with others. °What if I miss a dose? °It is important  not to miss your dose. Call your doctor or health care professional if you are unable to keep an appointment. °What may interact with this medicine? °Do not take this medicine with any of the following medications: °-other medicines containing denosumab °This medicine may also interact with the following medications: °-medicines that lower your chance of fighting infection °-steroid medicines like prednisone or cortisone °This list may not describe all possible interactions. Give your health care provider a list of all the medicines, herbs, non-prescription drugs, or dietary supplements you use. Also tell them if you smoke, drink alcohol, or use illegal drugs. Some items may interact with your medicine. °What should I watch for while using this medicine? °Visit your doctor or health care professional for regular checks on your progress. Your doctor or health care professional may order blood tests and other tests to see how you are doing. °Call your doctor or health care professional for advice if you get a fever, chills or sore throat, or other symptoms of a cold or flu. Do not treat yourself. This drug may decrease your body's ability to fight infection. Try to avoid being around people who are sick. °You should make sure you get enough calcium and vitamin D while you are taking this medicine, unless your doctor tells you not to. Discuss the foods you eat and the vitamins you take with your health care professional. °See your dentist regularly. Brush and floss your teeth as directed. Before you have any dental work done, tell your dentist you are receiving this medicine. °Do not become pregnant while taking this medicine or for 5 months   after stopping it. Talk with your doctor or health care professional about your birth control options while taking this medicine. Women should inform their doctor if they wish to become pregnant or think they might be pregnant. There is a potential for serious side effects to an  unborn child. Talk to your health care professional or pharmacist for more information. °What side effects may I notice from receiving this medicine? °Side effects that you should report to your doctor or health care professional as soon as possible: °-allergic reactions like skin rash, itching or hives, swelling of the face, lips, or tongue °-bone pain °-breathing problems °-dizziness °-jaw pain, especially after dental work °-redness, blistering, peeling of the skin °-signs and symptoms of infection like fever or chills; cough; sore throat; pain or trouble passing urine °-signs of low calcium like fast heartbeat, muscle cramps or muscle pain; pain, tingling, numbness in the hands or feet; seizures °-unusual bleeding or bruising °-unusually weak or tired °Side effects that usually do not require medical attention (report to your doctor or health care professional if they continue or are bothersome): °-constipation °-diarrhea °-headache °-joint pain °-loss of appetite °-muscle pain °-runny nose °-tiredness °-upset stomach °This list may not describe all possible side effects. Call your doctor for medical advice about side effects. You may report side effects to FDA at 1-800-FDA-1088. °Where should I keep my medicine? °This medicine is only given in a clinic, doctor's office, or other health care setting and will not be stored at home. °NOTE: This sheet is a summary. It may not cover all possible information. If you have questions about this medicine, talk to your doctor, pharmacist, or health care provider. °© 2018 [employer]vier/Gold Standard (2016-11-19 19:17:21) ° °

## 2017-02-11 ENCOUNTER — Ambulatory Visit: Payer: Medicare Other | Admitting: Podiatry

## 2017-02-13 ENCOUNTER — Ambulatory Visit (INDEPENDENT_AMBULATORY_CARE_PROVIDER_SITE_OTHER): Payer: Medicare Other | Admitting: Physician Assistant

## 2017-02-13 VITALS — BP 144/69 | HR 81 | Temp 98.4°F | Resp 18 | Ht 59.0 in | Wt 107.0 lb

## 2017-02-13 DIAGNOSIS — R8299 Other abnormal findings in urine: Secondary | ICD-10-CM | POA: Diagnosis not present

## 2017-02-13 DIAGNOSIS — M791 Myalgia, unspecified site: Secondary | ICD-10-CM

## 2017-02-13 DIAGNOSIS — R82998 Other abnormal findings in urine: Secondary | ICD-10-CM

## 2017-02-13 LAB — POCT URINALYSIS DIP (MANUAL ENTRY)
Bilirubin, UA: NEGATIVE
Glucose, UA: NEGATIVE
Ketones, POC UA: NEGATIVE
Nitrite, UA: NEGATIVE
Spec Grav, UA: 1.015 (ref 1.030–1.035)
Urobilinogen, UA: 0.2 (ref ?–2.0)
pH, UA: 6.5 (ref 5.0–8.0)

## 2017-02-13 LAB — POCT CBC
Granulocyte percent: 75.8 %G (ref 37–80)
HCT, POC: 38 % (ref 37.7–47.9)
Hemoglobin: 13.1 g/dL (ref 12.2–16.2)
Lymph, poc: 1.3 (ref 0.6–3.4)
MCH, POC: 32.8 pg — AB (ref 27–31.2)
MCHC: 34.3 g/dL (ref 31.8–35.4)
MCV: 95.6 fL (ref 80–97)
MID (cbc): 0.3 (ref 0–0.9)
MPV: 7.3 fL (ref 0–99.8)
POC Granulocyte: 5 (ref 2–6.9)
POC LYMPH PERCENT: 19.2 %L (ref 10–50)
POC MID %: 5 %M (ref 0–12)
Platelet Count, POC: 209 10*3/uL (ref 142–424)
RBC: 3.98 M/uL — AB (ref 4.04–5.48)
RDW, POC: 13 %
WBC: 6.6 10*3/uL (ref 4.6–10.2)

## 2017-02-13 LAB — POC MICROSCOPIC URINALYSIS (UMFC): Mucus: ABSENT

## 2017-02-13 MED ORDER — CEPHALEXIN 500 MG PO CAPS: 500.0000 mg | ORAL_CAPSULE | Freq: Three times a day (TID) | ORAL | 0 refills | Status: DC

## 2017-02-13 NOTE — Progress Notes (Signed)
SJ

## 2017-02-13 NOTE — Progress Notes (Signed)
02/13/2017 4:16 PM   DOB: 04-30-1933 / MRN: [id number]  SUBJECTIVE:  [name] is a 83 y.o. female presenting for "feeling like she was hit by a truck." This is mostly myalgia and HA.  Says that she feels this in her bones. She did some heavy yeard work about 4 days ago and tells me that she walked "5 miles" on that day.   Associates faitgue.  The HA is occipital in nature, constant and achey in nature. She denies weakness in her hand and feet, sudden changes in vision, slurring of speech or confusion. She did get her flu shot this year.    Immunization History  Administered Date(s) Administered  . Influenza Split 09/09/2011, 07/26/2013, 08/05/2016  . Influenza Whole 07/12/2012  . Influenza,inj,Quad PF,36+ Mos 08/10/2014       She is allergic to risedronate sodium and hydrocodone.   She  has a past medical history of Cataract; colonic polyps; Hypercholesterolemia; Lumbar back pain; Neck pain; Osteoporosis; and Swelling of ankle (01/12/16).    She  reports that she quit smoking about 62 years ago. Her smoking use included Cigarettes. She has a 0.25 pack-year smoking history. She has never used smokeless tobacco. She reports that she drinks about 4.2 oz of alcohol per week . She reports that she does not use drugs. She  reports that she does not engage in sexual activity. The patient  has a past surgical history that includes Lumbar laminectomy (1985); surgery for removal of rectal polyp (07/1995); Abdominal hysterectomy (12/2003); cataract surgery; and Tubal ligation.  Her family history includes Heart Problems in her brother and sister; Stroke in her mother.  Review of Systems  Constitutional: Positive for chills and diaphoresis. Negative for fever.  Respiratory: Negative for cough, hemoptysis, sputum production, shortness of breath and wheezing.   Cardiovascular: Negative for chest pain.  Genitourinary: Negative for dysuria.  Musculoskeletal: Positive for myalgias.  Skin: Negative for  rash.  Neurological: Negative for dizziness.    The problem list and medications were reviewed and updated by myself where necessary and exist elsewhere in the encounter.   OBJECTIVE:  BP (!) 144/69   Pulse 81   Temp 98.4 F (36.9 C) (Oral)   Resp 18   Ht 4\' 11"  (1.499 m)   Wt 107 lb (48.5 kg)   SpO2 98%   BMI 21.61 kg/m   BP Readings from Last 3 Encounters:  02/13/17 (!) 144/69  01/15/17 138/64  12/05/16 132/62   Pulse Readings from Last 3 Encounters:  02/13/17 81  01/15/17 60  12/05/16 68      Physical Exam  Constitutional: She is oriented to person, place, and time. She is active.  Non-toxic appearance.  HENT:  Right Ear: Hearing, tympanic membrane, external ear and ear canal normal.  Left Ear: Hearing, tympanic membrane, external ear and ear canal normal.  Nose: Nose normal. Right sinus exhibits no maxillary sinus tenderness and no frontal sinus tenderness. Left sinus exhibits no maxillary sinus tenderness and no frontal sinus tenderness.  Mouth/Throat: Uvula is midline, oropharynx is clear and moist and mucous membranes are normal. Mucous membranes are not dry. No oropharyngeal exudate, posterior oropharyngeal edema or tonsillar abscesses.  Eyes: EOM are normal. Pupils are equal, round, and reactive to light.  Cardiovascular: Normal rate, regular rhythm, S1 normal, S2 normal, normal heart sounds and intact distal pulses.  Exam reveals no gallop, no friction rub and no decreased pulses.   No murmur heard. Pulmonary/Chest: Effort normal. No stridor. No tachypnea.  No respiratory distress. She has no wheezes. She has no rales.  Abdominal: Soft. Bowel sounds are normal. She exhibits no distension and no mass. There is no tenderness. There is no rebound, no guarding and no CVA tenderness.  Musculoskeletal: She exhibits tenderness (B[place]arlly SCM, Levator scapulae, trapezius, rhomboid). She exhibits no edema.  Lymphadenopathy:       Head (right side): No submandibular and  no tonsillar adenopathy present.       Head (left side): No submandibular and no tonsillar adenopathy present.    She has no cervical adenopathy.  Neurological: She is alert and oriented to person, place, and time. She has normal strength and normal reflexes. She is not disoriented. She displays no atrophy. No cranial nerve deficit or sensory deficit. She exhibits normal muscle tone. Coordination and gait normal.  Skin: Skin is warm and dry. She is not diaphoretic. No pallor.  Psychiatric: Her behavior is normal.    Results for orders placed or performed in visit on 02/13/17 (from the past 72 hour(s))  POCT CBC     Status: Abnormal   Collection Time: 02/13/17  3:28 PM  Result Value Ref Range   WBC 6.6 4.6 - 10.2 K/uL   Lymph, poc 1.3 0.6 - 3.4   POC LYMPH PERCENT 19.2 10 - 50 %L   MID (cbc) 0.3 0 - 0.9   POC MID % 5.0 0 - 12 %M   POC Granulocyte 5.0 2 - 6.9   Granulocyte percent 75.8 37 - 80 %G   RBC 3.98 (A) 4.04 - 5.48 M/uL   Hemoglobin 13.1 12.2 - 16.2 g/dL   HCT, POC 38.0 37.7 - 47.9 %   MCV 95.6 80 - 97 fL   MCH, POC 32.8 (A) 27 - 31.2 pg   MCHC 34.3 31.8 - 35.4 g/dL   RDW, POC 13.0 %   Platelet Count, POC 209 142 - 424 K/uL   MPV 7.3 0 - 99.8 fL  POCT urinalysis dipstick     Status: Abnormal   Collection Time: 02/13/17  3:36 PM  Result Value Ref Range   Color, UA yellow yellow   Clarity, UA clear clear   Glucose, UA negative negative   Bilirubin, UA negative negative   Ketones, POC UA negative negative   Spec Grav, UA 1.015 1.030 - 1.035   Blood, UA trace-lysed (A) negative   pH, UA 6.5 5.0 - 8.0   Protein Ur, POC trace (A) negative   Urobilinogen, UA 0.2 Negative - 2.0   Nitrite, UA Negative Negative   Leukocytes, UA small (1+) (A) Negative  POCT Microscopic Urinalysis ([facility])     Status: Abnormal   Collection Time: 02/13/17  3:57 PM  Result Value Ref Range   WBC,UR,HPF,POC Moderate (A) None WBC/hpf   RBC,UR,HPF,POC None None RBC/hpf   Bacteria None None, Too  numerous to count   Mucus Absent Absent   Epithelial Cells, UR Per Microscopy Moderate (A) None, Too numerous to count cells/hpf    No results found.  ASSESSMENT AND PLAN:  [name] was seen today for generalized body aches, fatigue and headache.  Diagnoses and all orders for this visit:  Myalgia: Possibly delayed onset muscle soreness.  However I would not be surprised if she has a urinary tract infection given problem 2. CK out.  CBC looks good.  -     POCT urinalysis dipstick -     POCT CBC -     CK -     POCT Microscopic  Urinalysis ([facility]) -     BMET  Urine leukocytes increased: Given her symptoms and age her urine makes me suspect a UTI despite a lack of specific urinary symptoms.  I am going to culture her urine and cover for a UTI.  If the culture is negative I will call.  -     Urine culture -     cephALEXin (KEFLEX) 500 MG capsule; Take 1 capsule (500 mg total) by mouth 3 (three) times daily.    The patient is advised to call or return to clinic if she does not see an improvement in symptoms, or to seek the care of the closest emergency department if she worsens with the above plan.   Philis Fendt, MHS, PA-C [facility] 02/13/2017 4:16 PM

## 2017-02-13 NOTE — Patient Instructions (Addendum)
Tylenol 1000 mg every 8 hours for aches, pain and HA.     IF you received an x-ray today, you will receive an invoice from [facility]. Please contact [facility] at [phone] with questions or concerns regarding your invoice.   IF you received labwork today, you will receive an invoice from [facility]. Please contact [facility] at [phone] with questions or concerns regarding your invoice.   Our billing staff will not be able to assist you with questions regarding bills from these companies.  You will be contacted with the lab results as soon as they are available. The fastest way to get your results is to activate your My Chart account. Instructions are located on the last page of this paperwork. If you have not heard from [place] regarding the results in 2 weeks, please contact this office.

## 2017-02-14 LAB — BASIC METABOLIC PANEL
BUN/Creatinine Ratio: 19 (ref 12–28)
BUN: 13 mg/dL (ref 8–27)
CO2: 22 mmol/L (ref 18–29)
Calcium: 8.7 mg/dL (ref 8.7–10.3)
Chloride: 103 mmol/L (ref 96–106)
Creatinine, Ser: 0.69 mg/dL (ref 0.57–1.00)
GFR calc Af Amer: 94 mL/min/{1.73_m2} (ref >59)
GFR calc non Af Amer: 81 mL/min/{1.73_m2} (ref >59)
Glucose: 99 mg/dL (ref 65–99)
Potassium: 3.9 mmol/L (ref 3.5–5.2)
Sodium: 143 mmol/L (ref 134–144)

## 2017-02-14 LAB — CK: Total CK: 217 U/L — ABNORMAL HIGH (ref 24–173)

## 2017-02-15 LAB — URINE CULTURE

## 2017-02-21 ENCOUNTER — Encounter: Payer: Self-pay | Admitting: Podiatry

## 2017-02-21 ENCOUNTER — Ambulatory Visit (INDEPENDENT_AMBULATORY_CARE_PROVIDER_SITE_OTHER): Payer: Medicare Other | Admitting: Podiatry

## 2017-02-21 DIAGNOSIS — L84 Corns and callosities: Secondary | ICD-10-CM

## 2017-02-23 NOTE — Progress Notes (Signed)
Subjective:     Patient ID: [name], female   DOB: [birth year], 83 y.o.   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

## 2017-02-24 ENCOUNTER — Telehealth: Payer: Self-pay | Admitting: Cardiovascular Disease

## 2017-02-24 DIAGNOSIS — E785 Hyperlipidemia, unspecified: Secondary | ICD-10-CM

## 2017-02-24 NOTE — Telephone Encounter (Signed)
New Message:    Pt is scheduled to see Dr Burt Knack on 05-23-17. She wants lab work before her appt.Please  order this for her and let her know when she needs to come.

## 2017-02-25 NOTE — Telephone Encounter (Signed)
Lab appointment scheduled 05/06/17. Pt aware.

## 2017-03-12 DIAGNOSIS — L570 Actinic keratosis: Secondary | ICD-10-CM | POA: Diagnosis not present

## 2017-04-03 ENCOUNTER — Other Ambulatory Visit: Payer: Self-pay | Admitting: Cardiovascular Disease

## 2017-05-06 ENCOUNTER — Other Ambulatory Visit: Payer: Medicare Other | Admitting: *Deleted

## 2017-05-06 DIAGNOSIS — E785 Hyperlipidemia, unspecified: Secondary | ICD-10-CM | POA: Diagnosis not present

## 2017-05-06 LAB — LIPID PANEL
Chol/HDL Ratio: 2.6 ratio (ref 0.0–4.4)
Cholesterol, Total: 180 mg/dL (ref 100–199)
HDL: 69 mg/dL (ref >39)
LDL Calculated: 97 mg/dL (ref 0–99)
Triglycerides: 68 mg/dL (ref 0–149)
VLDL Cholesterol Cal: 14 mg/dL (ref 5–40)

## 2017-05-06 LAB — HEPATIC FUNCTION PANEL
ALT: 18 IU/L (ref 0–32)
AST: 25 IU/L (ref 0–40)
Albumin: 4 g/dL (ref 3.5–4.7)
Alkaline Phosphatase: 63 IU/L (ref 39–117)
Bilirubin Total: 0.5 mg/dL (ref 0.0–1.2)
Bilirubin, Direct: 0.13 mg/dL (ref 0.00–0.40)
Total Protein: 6.5 g/dL (ref 6.0–8.5)

## 2017-05-19 ENCOUNTER — Telehealth: Payer: Self-pay | Admitting: Cardiovascular Disease

## 2017-05-19 NOTE — Telephone Encounter (Signed)
Reviewed results of Lipid and liver panel with pt who states understanding.  She will continue medications as listed.

## 2017-05-19 NOTE — Telephone Encounter (Signed)
F/u message ° °Pt returning Rn call about lab results. Please call back to discuss  °

## 2017-05-23 ENCOUNTER — Ambulatory Visit (INDEPENDENT_AMBULATORY_CARE_PROVIDER_SITE_OTHER): Payer: Medicare Other | Admitting: Cardiovascular Disease

## 2017-05-23 ENCOUNTER — Encounter: Payer: Self-pay | Admitting: Cardiovascular Disease

## 2017-05-23 VITALS — BP 142/66 | HR 64 | Ht 59.0 in | Wt 105.8 lb

## 2017-05-23 DIAGNOSIS — E782 Mixed hyperlipidemia: Secondary | ICD-10-CM

## 2017-05-23 NOTE — Progress Notes (Signed)
Cardiology Office Note Date:  05/23/2017   ID:  [name], DOB 1933/01/01, MRN [id number]  PCP:  Crist Infante, MD  Cardiologist:  Sherren Mocha, MD    Chief Complaint  Patient presents with  . 1 year follow up     History of Present Illness: [name] is a 83 y.o. female who presents for follow-up of hyperlipidemia and strong family history of coronary artery disease. The patient is statin intolerant. She has been treated with Niaspan. He's also been evaluated by vascular surgery for leg swelling and is felt to have lymphedema.  The patient is here alone today. She feels well. She denies chest pain, shortness of breath, or heart palpitations. She remains physically active. She still has some swelling in her left leg and she wears a compression stocking. Her husband is not healthy and she spends a lot of time caring for him.  Past Medical History:  Diagnosis Date  . Cataract   . Hx of colonic polyps   . Hypercholesterolemia   . Lumbar back pain   . Neck pain   . Osteoporosis   . Swelling of ankle 01/12/16   bilateral. Pt is taking lasix x 2 weeks and wearing compression stockings    Past Surgical History:  Procedure Laterality Date  . ABDOMINAL HYSTERECTOMY  12/2003   DR. Neal--bso, A/P repair  . cataract surgery    . LUMBAR LAMINECTOMY  1985   Dr. Gladstone Lighter  . surgery for removal of rectal polyp  07/1995   Dr. Modena Morrow  . TUBAL LIGATION      Current Outpatient Prescriptions  Medication Sig Dispense Refill  . aspirin 81 MG tablet Take 81 mg by mouth daily.      . Calcium Carbonate-Vitamin D (CALTRATE 600+D) 600-400 MG-UNIT per tablet Take 1 tablet by mouth 2 (two) times daily.     . cholecalciferol (VITAMIN D) 1000 UNITS tablet Take 2,000 Units by mouth daily.     . Cyanocobalamin (VITAMIN B 12 PO) Take 1 tablet by mouth daily.    . niacin (NIASPAN) 1000 MG CR tablet TAKE 1 TABLET BY MOUTH EVERY DAY 90 tablet 0  . Omega-3 Fatty Acids (FISH OIL) 300 MG CAPS  Take 1 capsule by mouth daily.      . Probiotic Product (PROBIOTIC DAILY PO) Take 1 capsule by mouth daily.     No current facility-administered medications for this visit.     Allergies:   Risedronate sodium; Cephalexin; and Hydrocodone   Social History:  The patient  reports that she quit smoking about 62 years ago. Her smoking use included Cigarettes. She has a 0.25 pack-year smoking history. She has never used smokeless tobacco. She reports that she drinks about 4.2 oz of alcohol per week . She reports that she does not use drugs.   Family History:  The patient's  family history includes Heart Problems in her brother and sister; Stroke in her mother.    ROS:  Please see the history of present illness.  All other systems are reviewed and negative.    PHYSICAL EXAM: VS:  BP (!) 142/66   Pulse 64   Ht '4\' 11"'  (1.499 m)   Wt 105 lb 12.8 oz (48 kg)   BMI 21.37 kg/m  , BMI Body mass index is 21.37 kg/m. GEN: Well nourished, well developed, in no acute distress  HEENT: normal  Neck: no JVD, no masses. No carotid bruits Cardiac: RRR without murmur or gallop  Respiratory:  clear to auscultation bilaterally, normal work of breathing GI: soft, nontender, nondistended, + BS MS: no deformity or atrophy  Ext: trace left pretibial edema, pedal pulses 2+= bilaterally Skin: warm and dry, no rash Neuro:  Strength and sensation are intact Psych: euthymic mood, full affect  EKG:  EKG is ordered today. The ekg ordered today shows NSR 64 bpm, nonspecific ST abnormality  Recent Labs: 02/13/2017: BUN 13; Creatinine, Ser 0.69; Hemoglobin 13.1; Potassium 3.9; Sodium 143 05/06/2017: ALT 18   Lipid Panel     Component Value Date/Time   CHOL 180 05/06/2017 0736   TRIG 68 05/06/2017 0736   HDL 69 05/06/2017 0736   CHOLHDL 2.6 05/06/2017 0736   CHOLHDL 2.3 04/19/2016 0736   VLDL 10 04/19/2016 0736   LDLCALC 97 05/06/2017 0736   LDLDIRECT 158.8 11/14/2011 0733      Wt Readings  from Last 3 Encounters:  05/23/17 105 lb 12.8 oz (48 kg)  02/13/17 107 lb (48.5 kg)  01/15/17 105 lb 6 oz (47.8 kg)     ASSESSMENT AND PLAN: 1.  Mixed hyperlipidemia: Recent lipids reviewed and demonstrated a cholesterol of 180, HDL 69, LDL 97, and triglycerides 68. Patient does not have a personal history of coronary artery disease. Her lipids are at goal. We'll continue same treatment.  2. Lymphedema left leg - continue compression  Current medicines are reviewed with the patient today.  The patient does not have concerns regarding medicines.  Labs/ tests ordered today include:   Orders Placed This Encounter  Procedures  . Lipid panel  . Comp Met (CMET)  . EKG 12-Lead   Disposition:   FU one year  Signed, Sherren Mocha, MD  05/23/2017 8:38 AM    [facility] [place], [place], [place]  [address] Phone: [phone]; Fax: [phone]

## 2017-05-23 NOTE — Patient Instructions (Signed)
Medication Instructions:  Your physician recommends that you continue on your current medications as directed. Please refer to the Current Medication list given to you today.  Labwork: Your physician recommends that you return for a FASTING LIPID and CMP in 1 YEAR-nothing to eat or drink after midnight, lab opens at 7:30 AM  Testing/Procedures: No new orders.   Follow-Up: Your physician wants you to follow-up in: 1 YEAR with Dr Cooper.  You will receive a reminder letter in the mail two months in advance. If you don't receive a letter, please call our office to schedule the follow-up appointment.   Any Other Special Instructions Will Be Listed Below (If Applicable).     If you need a refill on your cardiac medications before your next appointment, please call your pharmacy.   

## 2017-06-25 DIAGNOSIS — Z23 Encounter for immunization: Secondary | ICD-10-CM | POA: Diagnosis not present

## 2017-07-04 ENCOUNTER — Other Ambulatory Visit: Payer: Self-pay | Admitting: Cardiovascular Disease

## 2017-07-07 DIAGNOSIS — Z6821 Body mass index (BMI) 21.0-21.9, adult: Secondary | ICD-10-CM | POA: Diagnosis not present

## 2017-07-07 DIAGNOSIS — Z1231 Encounter for screening mammogram for malignant neoplasm of breast: Secondary | ICD-10-CM | POA: Diagnosis not present

## 2017-07-07 DIAGNOSIS — Z01419 Encounter for gynecological examination (general) (routine) without abnormal findings: Secondary | ICD-10-CM | POA: Diagnosis not present

## 2017-07-07 DIAGNOSIS — Z124 Encounter for screening for malignant neoplasm of cervix: Secondary | ICD-10-CM | POA: Diagnosis not present

## 2017-07-08 DIAGNOSIS — L309 Dermatitis, unspecified: Secondary | ICD-10-CM | POA: Diagnosis not present

## 2017-07-09 ENCOUNTER — Other Ambulatory Visit: Payer: Self-pay | Admitting: Obstetrics & Gynecology

## 2017-07-09 DIAGNOSIS — R928 Other abnormal and inconclusive findings on diagnostic imaging of breast: Secondary | ICD-10-CM

## 2017-07-15 ENCOUNTER — Ambulatory Visit
Admission: RE | Admit: 2017-07-15 | Discharge: 2017-07-15 | Disposition: A | Discharge status: Home / Self Care | Payer: Medicare Other | Source: Ambulatory Visit | Attending: Obstetrics & Gynecology | Admitting: Obstetrics & Gynecology

## 2017-07-15 ENCOUNTER — Ambulatory Visit: Payer: Medicare Other

## 2017-07-15 DIAGNOSIS — R928 Other abnormal and inconclusive findings on diagnostic imaging of breast: Secondary | ICD-10-CM

## 2017-07-18 ENCOUNTER — Ambulatory Visit (HOSPITAL_COMMUNITY): Payer: Medicare Other

## 2017-07-24 ENCOUNTER — Ambulatory Visit (HOSPITAL_COMMUNITY)
Admission: RE | Admit: 2017-07-24 | Discharge: 2017-07-24 | Disposition: A | Discharge status: Home / Self Care | Payer: Medicare Other | Source: Ambulatory Visit | Attending: Internal Medicine | Admitting: Internal Medicine

## 2017-07-25 DIAGNOSIS — H00021 Hordeolum internum right upper eyelid: Secondary | ICD-10-CM | POA: Diagnosis not present

## 2017-09-08 DIAGNOSIS — M810 Age-related osteoporosis without current pathological fracture: Secondary | ICD-10-CM | POA: Diagnosis not present

## 2017-09-10 ENCOUNTER — Ambulatory Visit (HOSPITAL_COMMUNITY): Payer: Medicare Other

## 2017-09-24 ENCOUNTER — Encounter (HOSPITAL_COMMUNITY): Payer: Self-pay

## 2017-09-24 ENCOUNTER — Ambulatory Visit (HOSPITAL_COMMUNITY)
Admission: RE | Admit: 2017-09-24 | Discharge: 2017-09-24 | Disposition: A | Discharge status: Home / Self Care | Payer: Medicare Other | Source: Ambulatory Visit | Attending: Internal Medicine | Admitting: Internal Medicine

## 2017-09-24 DIAGNOSIS — M810 Age-related osteoporosis without current pathological fracture: Secondary | ICD-10-CM | POA: Diagnosis not present

## 2017-09-24 MED ORDER — DENOSUMAB 60 MG/ML ~~LOC~~ SOLN
60.0000 mg | Freq: Once | SUBCUTANEOUS | Status: AC
  Administered 2017-09-24: 60 mg via SUBCUTANEOUS
  Filled 2017-09-24: qty 1

## 2017-09-24 NOTE — Discharge Instructions (Signed)
Denosumab injection °What is this medicine? °DENOSUMAB (den oh sue mab) slows bone breakdown. Prolia is used to treat osteoporosis in women after menopause and in men. Xgeva is used to treat a high calcium level due to cancer and to prevent bone fractures and other bone problems caused by multiple myeloma or cancer bone metastases. Xgeva is also used to treat giant cell tumor of the bone. °This medicine may be used for other purposes; ask your health care provider or pharmacist if you have questions. °COMMON BRAND NAME(S): Prolia, XGEVA °What should I tell my health care provider before I take this medicine? °They need to know if you have any of these conditions: °-dental disease °-having surgery or tooth extraction °-infection °-kidney disease °-low levels of calcium or Vitamin D in the blood °-malnutrition °-on hemodialysis °-skin conditions or sensitivity °-thyroid or parathyroid disease °-an unusual reaction to denosumab, other medicines, foods, dyes, or preservatives °-pregnant or trying to get pregnant °-breast-feeding °How should I use this medicine? °This medicine is for injection under the skin. It is given by a health care professional in a hospital or clinic setting. °If you are getting Prolia, a special MedGuide will be given to you by the pharmacist with each prescription and refill. Be sure to read this information carefully each time. °For Prolia, talk to your pediatrician regarding the use of this medicine in children. Special care may be needed. For Xgeva, talk to your pediatrician regarding the use of this medicine in children. While this drug may be prescribed for children as young as 13 years for selected conditions, precautions do apply. °Overdosage: If you think you have taken too much of this medicine contact a poison control center or emergency room at once. °NOTE: This medicine is only for you. Do not share this medicine with others. °What if I miss a dose? °It is important not to miss your  dose. Call your doctor or health care professional if you are unable to keep an appointment. °What may interact with this medicine? °Do not take this medicine with any of the following medications: °-other medicines containing denosumab °This medicine may also interact with the following medications: °-medicines that lower your chance of fighting infection °-steroid medicines like prednisone or cortisone °This list may not describe all possible interactions. Give your health care provider a list of all the medicines, herbs, non-prescription drugs, or dietary supplements you use. Also tell them if you smoke, drink alcohol, or use illegal drugs. Some items may interact with your medicine. °What should I watch for while using this medicine? °Visit your doctor or health care professional for regular checks on your progress. Your doctor or health care professional may order blood tests and other tests to see how you are doing. °Call your doctor or health care professional for advice if you get a fever, chills or sore throat, or other symptoms of a cold or flu. Do not treat yourself. This drug may decrease your body's ability to fight infection. Try to avoid being around people who are sick. °You should make sure you get enough calcium and vitamin D while you are taking this medicine, unless your doctor tells you not to. Discuss the foods you eat and the vitamins you take with your health care professional. °See your dentist regularly. Brush and floss your teeth as directed. Before you have any dental work done, tell your dentist you are receiving this medicine. °Do not become pregnant while taking this medicine or for 5 months after stopping   it. Talk with your doctor or health care professional about your birth control options while taking this medicine. Women should inform their doctor if they wish to become pregnant or think they might be pregnant. There is a potential for serious side effects to an unborn child. Talk  to your health care professional or pharmacist for more information. What side effects may I notice from receiving this medicine? Side effects that you should report to your doctor or health care professional as soon as possible: -allergic reactions like skin rash, itching or hives, swelling of the face, lips, or tongue -bone pain -breathing problems -dizziness -jaw pain, especially after dental work -redness, blistering, peeling of the skin -signs and symptoms of infection like fever or chills; cough; sore throat; pain or trouble passing urine -signs of low calcium like fast heartbeat, muscle cramps or muscle pain; pain, tingling, numbness in the hands or feet; seizures -unusual bleeding or bruising -unusually weak or tired Side effects that usually do not require medical attention (report to your doctor or health care professional if they continue or are bothersome): -constipation -diarrhea -headache -joint pain -loss of appetite -muscle pain -runny nose -tiredness -upset stomach This list may not describe all possible side effects. Call your doctor for medical advice about side effects. You may report side effects to FDA at 1-800-FDA-1088. Where should I keep my medicine? This medicine is only given in a clinic, doctor's office, or other health care setting and will not be stored at home. NOTE: This sheet is a summary. It may not cover all possible information. If you have questions about this medicine, talk to your doctor, pharmacist, or health care provider.  2018 Elsevier/Gold Standard (2016-11-19 19:17:21)

## 2017-09-24 NOTE — Progress Notes (Signed)
prolia 60mg  sq given today.  Pt has had before.  Next appointment for May 14,2019 given to pt.  Pt given d/c instructions on prolia also.  Pt d/c ambulatory to lobby.

## 2017-12-08 ENCOUNTER — Other Ambulatory Visit: Payer: Self-pay | Admitting: Cardiovascular Disease

## 2017-12-08 MED ORDER — NIACIN ER (ANTIHYPERLIPIDEMIC) 1000 MG PO TBCR: 1000.0000 mg | EXTENDED_RELEASE_TABLET | Freq: Every day | ORAL | 1 refills | Status: DC

## 2017-12-15 DIAGNOSIS — Z961 Presence of intraocular lens: Secondary | ICD-10-CM | POA: Diagnosis not present

## 2017-12-15 DIAGNOSIS — H5212 Myopia, left eye: Secondary | ICD-10-CM | POA: Diagnosis not present

## 2017-12-15 DIAGNOSIS — H524 Presbyopia: Secondary | ICD-10-CM | POA: Diagnosis not present

## 2017-12-15 DIAGNOSIS — H43393 Other vitreous opacities, bilateral: Secondary | ICD-10-CM | POA: Diagnosis not present

## 2017-12-15 DIAGNOSIS — H52223 Regular astigmatism, bilateral: Secondary | ICD-10-CM | POA: Diagnosis not present

## 2017-12-15 DIAGNOSIS — Z9849 Cataract extraction status, unspecified eye: Secondary | ICD-10-CM | POA: Diagnosis not present

## 2018-01-05 ENCOUNTER — Encounter: Payer: Self-pay | Admitting: Physician Assistant

## 2018-01-05 ENCOUNTER — Other Ambulatory Visit: Payer: Self-pay

## 2018-01-05 ENCOUNTER — Ambulatory Visit (INDEPENDENT_AMBULATORY_CARE_PROVIDER_SITE_OTHER): Payer: Medicare Other | Admitting: Physician Assistant

## 2018-01-05 VITALS — HR 68 | Temp 98.1°F | Resp 18 | Ht 59.0 in | Wt 104.2 lb

## 2018-01-05 DIAGNOSIS — R059 Cough, unspecified: Secondary | ICD-10-CM

## 2018-01-05 DIAGNOSIS — R05 Cough: Secondary | ICD-10-CM

## 2018-01-05 MED ORDER — DOXYCYCLINE HYCLATE 100 MG PO CAPS: 100.0000 mg | ORAL_CAPSULE | Freq: Two times a day (BID) | ORAL | 0 refills | Status: AC

## 2018-01-05 NOTE — Progress Notes (Signed)
01/05/2018 10:14 AM   DOB: [birth year] / MRN: [id number]  SUBJECTIVE:  [name] is a 84 y.o. female presenting for cough times three days. Has an overall feeling of "yuck." No fever, chills, nausea, SOB, DOE.   She is allergic to risedronate sodium; cephalexin; and hydrocodone.   She  has a past medical history of Cataract, colonic polyps, Hypercholesterolemia, Lumbar back pain, Neck pain, Osteoporosis, and Swelling of ankle (01/12/16).    She  reports that she quit smoking about 63 years ago. Her smoking use included cigarettes. She has a 0.25 pack-year smoking history. she has never used smokeless tobacco. She reports that she drinks about 4.2 oz of alcohol per week. She reports that she does not use drugs. She  reports that she does not engage in sexual activity. The patient  has a past surgical history that includes Lumbar laminectomy (1985); surgery for removal of rectal polyp (07/1995); Abdominal hysterectomy (12/2003); cataract surgery; and Tubal ligation.  Her family history includes Heart Problems in her brother and sister; Stroke in her mother.  Review of Systems  Constitutional: Negative for chills, diaphoresis and fever.  HENT: Negative for sore throat.   Respiratory: Positive for cough. Negative for hemoptysis, sputum production, shortness of breath and wheezing.   Cardiovascular: Negative for chest pain, orthopnea and leg swelling.  Gastrointestinal: Negative for nausea.  Skin: Negative for rash.  Neurological: Negative for dizziness.    The problem list and medications were reviewed and updated by myself where necessary and exist elsewhere in the encounter.   OBJECTIVE:  Pulse 68   Temp 98.1 F (36.7 C) (Oral)   Resp 18   Ht 4\' 11"  (1.499 m)   Wt 104 lb 3.2 oz (47.3 kg)   SpO2 100%   BMI 21.05 kg/m   Physical Exam  Constitutional: She is active.  Non-toxic appearance.  HENT:  Right Ear: Hearing, tympanic membrane, external ear and ear canal normal.  Left  Ear: Hearing, tympanic membrane, external ear and ear canal normal.  Nose: Nose normal. Right sinus exhibits no maxillary sinus tenderness and no frontal sinus tenderness. Left sinus exhibits no maxillary sinus tenderness and no frontal sinus tenderness.  Mouth/Throat: Uvula is midline, oropharynx is clear and moist and mucous membranes are normal. Mucous membranes are not dry. No oropharyngeal exudate, posterior oropharyngeal edema or tonsillar abscesses.  Cardiovascular: Normal rate, regular rhythm, S1 normal, S2 normal, normal heart sounds and intact distal pulses. Exam reveals no gallop, no friction rub and no decreased pulses.  No murmur heard. Pulmonary/Chest: Effort normal. No stridor. No tachypnea. No respiratory distress. She has no wheezes. She has no rales.  Abdominal: She exhibits no distension.  Musculoskeletal: She exhibits no edema.  Lymphadenopathy:       Head (right side): No submandibular and no tonsillar adenopathy present.       Head (left side): No submandibular and no tonsillar adenopathy present.    She has no cervical adenopathy.  Neurological: She is alert.  Skin: Skin is warm and dry. She is not diaphoretic. No pallor.    No results found for this or any previous visit (from the past 72 hour(s)).  No results found.  ASSESSMENT AND PLAN:  [name] was seen today for cough.  Diagnoses and all orders for this visit:  Cough Comments: Likely viral.  However, I am printing a script for doxy should she not improve in the next few days. She can start anytime she feels worse.  The patient is advised to call or return to clinic if she does not see an improvement in symptoms, or to seek the care of the closest emergency department if she worsens with the above plan.   Philis Fendt, MHS, PA-C [facility] 01/05/2018 10:14 AM

## 2018-01-05 NOTE — Patient Instructions (Addendum)
  Pick up the doxy if you are not improving in the next 4-5 days.  Pick up anytime you are feeling worse.    IF you received an x-ray today, you will receive an invoice from [facility]. Please contact [facility] at [phone] with questions or concerns regarding your invoice.   IF you received labwork today, you will receive an invoice from [facility]. Please contact [facility] at [phone] with questions or concerns regarding your invoice.   Our billing staff will not be able to assist you with questions regarding bills from these companies.  You will be contacted with the lab results as soon as they are available. The fastest way to get your results is to activate your My Chart account. Instructions are located on the last page of this paperwork. If you have not heard from [place] regarding the results in 2 weeks, please contact this office.

## 2018-01-14 DIAGNOSIS — C44519 Basal cell carcinoma of skin of other part of trunk: Secondary | ICD-10-CM | POA: Diagnosis not present

## 2018-01-14 DIAGNOSIS — C44719 Basal cell carcinoma of skin of left lower limb, including hip: Secondary | ICD-10-CM | POA: Diagnosis not present

## 2018-01-14 DIAGNOSIS — L570 Actinic keratosis: Secondary | ICD-10-CM | POA: Diagnosis not present

## 2018-01-14 DIAGNOSIS — D485 Neoplasm of uncertain behavior of skin: Secondary | ICD-10-CM | POA: Diagnosis not present

## 2018-01-14 DIAGNOSIS — L821 Other seborrheic keratosis: Secondary | ICD-10-CM | POA: Diagnosis not present

## 2018-01-14 DIAGNOSIS — L853 Xerosis cutis: Secondary | ICD-10-CM | POA: Diagnosis not present

## 2018-01-30 DIAGNOSIS — M810 Age-related osteoporosis without current pathological fracture: Secondary | ICD-10-CM | POA: Diagnosis not present

## 2018-01-30 DIAGNOSIS — R82998 Other abnormal findings in urine: Secondary | ICD-10-CM | POA: Diagnosis not present

## 2018-01-30 DIAGNOSIS — E785 Hyperlipidemia, unspecified: Secondary | ICD-10-CM | POA: Diagnosis not present

## 2018-02-06 DIAGNOSIS — J3089 Other allergic rhinitis: Secondary | ICD-10-CM | POA: Diagnosis not present

## 2018-02-06 DIAGNOSIS — F4320 Adjustment disorder, unspecified: Secondary | ICD-10-CM | POA: Insufficient documentation

## 2018-02-06 DIAGNOSIS — Z23 Encounter for immunization: Secondary | ICD-10-CM | POA: Diagnosis not present

## 2018-02-06 DIAGNOSIS — Z0000 Encounter for general adult medical examination without abnormal findings: Secondary | ICD-10-CM | POA: Diagnosis not present

## 2018-02-06 DIAGNOSIS — D7589 Other specified diseases of blood and blood-forming organs: Secondary | ICD-10-CM | POA: Diagnosis not present

## 2018-02-06 DIAGNOSIS — Z6821 Body mass index (BMI) 21.0-21.9, adult: Secondary | ICD-10-CM | POA: Diagnosis not present

## 2018-02-06 DIAGNOSIS — I872 Venous insufficiency (chronic) (peripheral): Secondary | ICD-10-CM | POA: Diagnosis not present

## 2018-02-06 DIAGNOSIS — R600 Localized edema: Secondary | ICD-10-CM | POA: Diagnosis not present

## 2018-02-06 DIAGNOSIS — M799 Soft tissue disorder, unspecified: Secondary | ICD-10-CM | POA: Insufficient documentation

## 2018-02-06 DIAGNOSIS — Z1389 Encounter for screening for other disorder: Secondary | ICD-10-CM | POA: Diagnosis not present

## 2018-02-06 DIAGNOSIS — B029 Zoster without complications: Secondary | ICD-10-CM | POA: Diagnosis not present

## 2018-02-06 DIAGNOSIS — Z1212 Encounter for screening for malignant neoplasm of rectum: Secondary | ICD-10-CM | POA: Diagnosis not present

## 2018-02-06 DIAGNOSIS — M810 Age-related osteoporosis without current pathological fracture: Secondary | ICD-10-CM | POA: Diagnosis not present

## 2018-02-06 DIAGNOSIS — L308 Other specified dermatitis: Secondary | ICD-10-CM | POA: Diagnosis not present

## 2018-02-06 DIAGNOSIS — Z780 Asymptomatic menopausal state: Secondary | ICD-10-CM | POA: Diagnosis not present

## 2018-02-09 ENCOUNTER — Other Ambulatory Visit (HOSPITAL_COMMUNITY): Payer: Self-pay | Admitting: Internal Medicine

## 2018-02-09 ENCOUNTER — Ambulatory Visit (HOSPITAL_COMMUNITY)
Admission: RE | Admit: 2018-02-09 | Discharge: 2018-02-09 | Disposition: A | Discharge status: Home / Self Care | Payer: Medicare Other | Source: Ambulatory Visit | Attending: Surgery | Admitting: Surgery

## 2018-02-09 DIAGNOSIS — M7989 Other specified soft tissue disorders: Secondary | ICD-10-CM | POA: Diagnosis not present

## 2018-02-09 DIAGNOSIS — M79662 Pain in left lower leg: Secondary | ICD-10-CM | POA: Insufficient documentation

## 2018-02-25 ENCOUNTER — Emergency Department (HOSPITAL_COMMUNITY)
Admission: EM | Admit: 2018-02-25 | Discharge: 2018-02-25 | Discharge status: Against Medical Advice | Payer: Medicare Other | Attending: Emergency Medicine | Admitting: Emergency Medicine

## 2018-02-25 ENCOUNTER — Encounter (HOSPITAL_COMMUNITY): Payer: Self-pay

## 2018-02-25 DIAGNOSIS — L299 Pruritus, unspecified: Secondary | ICD-10-CM | POA: Diagnosis present

## 2018-02-25 DIAGNOSIS — Z5321 Procedure and treatment not carried out due to patient leaving prior to being seen by health care provider: Secondary | ICD-10-CM | POA: Diagnosis not present

## 2018-02-25 NOTE — ED Triage Notes (Signed)
Pt states she thinks she's having an allergic reaction to her niacin, she says she woke up this morning and she was really hot and red

## 2018-03-18 ENCOUNTER — Ambulatory Visit (HOSPITAL_COMMUNITY): Payer: Medicare Other

## 2018-03-24 ENCOUNTER — Ambulatory Visit (HOSPITAL_COMMUNITY)
Admission: RE | Admit: 2018-03-24 | Discharge: 2018-03-24 | Disposition: A | Discharge status: Home / Self Care | Payer: Medicare Other | Source: Ambulatory Visit | Attending: Internal Medicine | Admitting: Internal Medicine

## 2018-03-24 ENCOUNTER — Encounter (HOSPITAL_COMMUNITY): Payer: Self-pay

## 2018-03-24 DIAGNOSIS — M810 Age-related osteoporosis without current pathological fracture: Secondary | ICD-10-CM | POA: Diagnosis not present

## 2018-03-24 MED ORDER — DENOSUMAB 60 MG/ML ~~LOC~~ SOSY
PREFILLED_SYRINGE | SUBCUTANEOUS | Status: AC
  Filled 2018-03-24: qty 1

## 2018-03-24 MED ORDER — DENOSUMAB 60 MG/ML ~~LOC~~ SOSY
60.0000 mg | PREFILLED_SYRINGE | Freq: Once | SUBCUTANEOUS | Status: AC
  Administered 2018-03-24: 60 mg via SUBCUTANEOUS

## 2018-03-24 NOTE — Discharge Instructions (Signed)
Denosumab injection/Prolia Injection  °What is this medicine? °DENOSUMAB (den oh sue mab) slows bone breakdown. Prolia is used to treat osteoporosis in women after menopause and in men. Xgeva is used to treat a high calcium level due to cancer and to prevent bone fractures and other bone problems caused by multiple myeloma or cancer bone metastases. Xgeva is also used to treat giant cell tumor of the bone. °This medicine may be used for other purposes; ask your health care provider or pharmacist if you have questions. °COMMON BRAND NAME(S): Prolia, XGEVA °What should I tell my health care provider before I take this medicine? °They need to know if you have any of these conditions: °-dental disease °-having surgery or tooth extraction °-infection °-kidney disease °-low levels of calcium or Vitamin D in the blood °-malnutrition °-on hemodialysis °-skin conditions or sensitivity °-thyroid or parathyroid disease °-an unusual reaction to denosumab, other medicines, foods, dyes, or preservatives °-pregnant or trying to get pregnant °-breast-feeding °How should I use this medicine? °This medicine is for injection under the skin. It is given by a health care professional in a hospital or clinic setting. °If you are getting Prolia, a special MedGuide will be given to you by the pharmacist with each prescription and refill. Be sure to read this information carefully each time. °For Prolia, talk to your pediatrician regarding the use of this medicine in children. Special care may be needed. For Xgeva, talk to your pediatrician regarding the use of this medicine in children. While this drug may be prescribed for children as young as 13 years for selected conditions, precautions do apply. °Overdosage: If you think you have taken too much of this medicine contact a poison control center or emergency room at once. °NOTE: This medicine is only for you. Do not share this medicine with others. °What if I miss a dose? °It is important  not to miss your dose. Call your doctor or health care professional if you are unable to keep an appointment. °What may interact with this medicine? °Do not take this medicine with any of the following medications: °-other medicines containing denosumab °This medicine may also interact with the following medications: °-medicines that lower your chance of fighting infection °-steroid medicines like prednisone or cortisone °This list may not describe all possible interactions. Give your health care provider a list of all the medicines, herbs, non-prescription drugs, or dietary supplements you use. Also tell them if you smoke, drink alcohol, or use illegal drugs. Some items may interact with your medicine. °What should I watch for while using this medicine? °Visit your doctor or health care professional for regular checks on your progress. Your doctor or health care professional may order blood tests and other tests to see how you are doing. °Call your doctor or health care professional for advice if you get a fever, chills or sore throat, or other symptoms of a cold or flu. Do not treat yourself. This drug may decrease your body's ability to fight infection. Try to avoid being around people who are sick. °You should make sure you get enough calcium and vitamin D while you are taking this medicine, unless your doctor tells you not to. Discuss the foods you eat and the vitamins you take with your health care professional. °See your dentist regularly. Brush and floss your teeth as directed. Before you have any dental work done, tell your dentist you are receiving this medicine. °Do not become pregnant while taking this medicine or for 5 months   after stopping it. Talk with your doctor or health care professional about your birth control options while taking this medicine. Women should inform their doctor if they wish to become pregnant or think they might be pregnant. There is a potential for serious side effects to an  unborn child. Talk to your health care professional or pharmacist for more information. °What side effects may I notice from receiving this medicine? °Side effects that you should report to your doctor or health care professional as soon as possible: °-allergic reactions like skin rash, itching or hives, swelling of the face, lips, or tongue °-bone pain °-breathing problems °-dizziness °-jaw pain, especially after dental work °-redness, blistering, peeling of the skin °-signs and symptoms of infection like fever or chills; cough; sore throat; pain or trouble passing urine °-signs of low calcium like fast heartbeat, muscle cramps or muscle pain; pain, tingling, numbness in the hands or feet; seizures °-unusual bleeding or bruising °-unusually weak or tired °Side effects that usually do not require medical attention (report to your doctor or health care professional if they continue or are bothersome): °-constipation °-diarrhea °-headache °-joint pain °-loss of appetite °-muscle pain °-runny nose °-tiredness °-upset stomach °This list may not describe all possible side effects. Call your doctor for medical advice about side effects. You may report side effects to FDA at 1-800-FDA-1088. °Where should I keep my medicine? °This medicine is only given in a clinic, doctor's office, or other health care setting and will not be stored at home. °NOTE: This sheet is a summary. It may not cover all possible information. If you have questions about this medicine, talk to your doctor, pharmacist, or health care provider. °© 2018 [employer]vier/Gold Standard (2016-11-19 19:17:21) ° °

## 2018-04-27 ENCOUNTER — Encounter: Payer: Self-pay | Admitting: Podiatry

## 2018-04-27 ENCOUNTER — Ambulatory Visit (INDEPENDENT_AMBULATORY_CARE_PROVIDER_SITE_OTHER): Payer: Medicare Other | Admitting: Podiatry

## 2018-04-27 ENCOUNTER — Ambulatory Visit (INDEPENDENT_AMBULATORY_CARE_PROVIDER_SITE_OTHER): Payer: Medicare Other

## 2018-04-27 DIAGNOSIS — M2042 Other hammer toe(s) (acquired), left foot: Secondary | ICD-10-CM

## 2018-04-27 DIAGNOSIS — L84 Corns and callosities: Secondary | ICD-10-CM

## 2018-04-27 DIAGNOSIS — M2012 Hallux valgus (acquired), left foot: Secondary | ICD-10-CM

## 2018-04-29 NOTE — Progress Notes (Signed)
Subjective:   Patient ID: [name], female   DOB: 84 y.o.   MRN: [id number]   HPI Patient presents stating that she is had pain between the fourth and fifth toes on the left foot and the fifth toe was swollen and not as bad now but still present.  Also has bunion formation   ROS      Objective:  Physical Exam  Neurovascular status was found to be diminished but intact with patient noted to have a rotated fifth digit left with inflammation at the inner phalangeal joint of the fourth digit left and also is noted to have structural bunion deformity with mild redness left.  Patient has good digital perfusion well oriented x3     Assessment:  Hammertoe deformity with rotated toe and keratotic lesion formation along with structural bunion deformity     Plan:  H&P and all conditions reviewed.  We discussed surgical intervention with derotational arthroplasty possibility for structural bunion but at this point we are going to try to be conservative and using sterile instrumentation I debrided lesion I applied cushion padding advised on wider shoes and other modalities.  Patient will be seen back on an as-needed basis  X-rays indicate that there is mild rotation of the toe between the fourth and fifth a.m. there is structural bunion deformity

## 2018-06-20 ENCOUNTER — Encounter: Payer: Self-pay | Admitting: Urgent Care

## 2018-06-20 ENCOUNTER — Ambulatory Visit (INDEPENDENT_AMBULATORY_CARE_PROVIDER_SITE_OTHER): Payer: Medicare Other | Admitting: Urgent Care

## 2018-06-20 ENCOUNTER — Other Ambulatory Visit: Payer: Self-pay

## 2018-06-20 VITALS — BP 118/84 | HR 74 | Temp 97.9°F | Resp 16 | Ht 59.0 in | Wt 105.4 lb

## 2018-06-20 DIAGNOSIS — R21 Rash and other nonspecific skin eruption: Secondary | ICD-10-CM

## 2018-06-20 DIAGNOSIS — L299 Pruritus, unspecified: Secondary | ICD-10-CM

## 2018-06-20 DIAGNOSIS — L2489 Irritant contact dermatitis due to other agents: Secondary | ICD-10-CM

## 2018-06-20 MED ORDER — BETAMETHASONE DIPROPIONATE 0.05 % EX CREA: TOPICAL_CREAM | Freq: Two times a day (BID) | CUTANEOUS | 0 refills | Status: AC

## 2018-06-20 MED ORDER — CETIRIZINE HCL 5 MG/5ML PO SOLN: 5.0000 mg | Freq: Every day | ORAL | 0 refills | Status: DC

## 2018-06-20 MED ORDER — RANITIDINE HCL 150 MG PO TABS: 150.0000 mg | ORAL_TABLET | Freq: Every day | ORAL | 0 refills | Status: DC

## 2018-06-20 NOTE — Patient Instructions (Addendum)
Contact Dermatitis Dermatitis is redness, soreness, and swelling (inflammation) of the skin. Contact dermatitis is a reaction to certain substances that touch the skin. There are two types of contact dermatitis:  Irritant contact dermatitis. This type is caused by something that irritates your skin, such as dry hands from washing them too much. This type does not require previous exposure to the substance for a reaction to occur. This type is more common.  Allergic contact dermatitis. This type is caused by a substance that you are allergic to, such as a nickel allergy or poison ivy. This type only occurs if you have been exposed to the substance (allergen) before. Upon a repeat exposure, your body reacts to the substance. This type is less common.  What are the causes? Many different substances can cause contact dermatitis. Irritant contact dermatitis is most commonly caused by exposure to:  Makeup.  Soaps.  Detergents.  Bleaches.  Acids.  Metal salts, such as nickel.  Allergic contact dermatitis is most commonly caused by exposure to:  Poisonous plants.  Chemicals.  Jewelry.  Latex.  Medicines.  Preservatives in products, such as clothing.  What increases the risk? This condition is more likely to develop in:  People who have jobs that expose them to irritants or allergens.  People who have certain medical conditions, such as asthma or eczema.  What are the signs or symptoms? Symptoms of this condition may occur anywhere on your body where the irritant has touched you or is touched by you. Symptoms include:  Dryness or flaking.  Redness.  Cracks.  Itching.  Pain or a burning feeling.  Blisters.  Drainage of small amounts of blood or clear fluid from skin cracks.  With allergic contact dermatitis, there may also be swelling in areas such as the eyelids, mouth, or genitals. How is this diagnosed? This condition is diagnosed with a medical history and  physical exam. A patch skin test may be performed to help determine the cause. If the condition is related to your job, you may need to see an occupational medicine specialist. How is this treated? Treatment for this condition includes figuring out what caused the reaction and protecting your skin from further contact. Treatment may also include:  Steroid creams or ointments. Oral steroid medicines may be needed in more severe cases.  Antibiotics or antibacterial ointments, if a skin infection is present.  Antihistamine lotion or an antihistamine taken by mouth to ease itching.  A bandage (dressing).  Follow these instructions at home: Skin Care  Moisturize your skin as needed.  Apply cool compresses to the affected areas.  Try taking a bath with: ? Epsom salts. Follow the instructions on the packaging. You can get these at your local pharmacy or grocery store. ? Baking soda. Pour a small amount into the bath as directed by your health care provider. ? Colloidal oatmeal. Follow the instructions on the packaging. You can get this at your local pharmacy or grocery store.  Try applying baking soda paste to your skin. Stir water into baking soda until it reaches a paste-like consistency.  Do not scratch your skin.  Bathe less frequently, such as every other day.  Bathe in lukewarm water. Avoid using hot water. Medicines  Take or apply over-the-counter and prescription medicines only as told by your health care provider.  If you were prescribed an antibiotic medicine, take or apply your antibiotic as told by your health care provider. Do not stop using the antibiotic even if your condition   starts to improve. General instructions  Keep all follow-up visits as told by your health care provider. This is important.  Avoid the substance that caused your reaction. If you do not know what caused it, keep a journal to try to track what caused it. Write down: ? What you eat. ? What  cosmetic products you use. ? What you drink. ? What you wear in the affected area. This includes jewelry.  If you were given a dressing, take care of it as told by your health care provider. This includes when to change and remove it. Contact a health care provider if:  Your condition does not improve with treatment.  Your condition gets worse.  You have signs of infection such as swelling, tenderness, redness, soreness, or warmth in the affected area.  You have a fever.  You have new symptoms. Get help right away if:  You have a severe headache, neck pain, or neck stiffness.  You vomit.  You feel very sleepy.  You notice red streaks coming from the affected area.  Your bone or joint underneath the affected area becomes painful after the skin has healed.  The affected area turns darker.  You have difficulty breathing. This information is not intended to replace advice given to you by your health care provider. Make sure you discuss any questions you have with your health care provider. Document Released: 10/25/2000 Document Revised: 04/04/2016 Document Reviewed: 03/15/2015 Elsevier Interactive Patient Education  2018 [employer].     IF you received an x-ray today, you will receive an invoice from [facility]. Please contact [facility] at [phone] with questions or concerns regarding your invoice.   IF you received labwork today, you will receive an invoice from [facility]. Please contact [facility] at [phone] with questions or concerns regarding your invoice.   Our billing staff will not be able to assist you with questions regarding bills from these companies.  You will be contacted with the lab results as soon as they are available. The fastest way to get your results is to activate your My Chart account. Instructions are located on the last page of this paperwork. If you have not heard from [place] regarding the results in 2 weeks, please contact  this office.

## 2018-06-20 NOTE — Progress Notes (Signed)
    MRN: [id number] DOB: 03-20-1933  Subjective:   [name] is a 84 y.o. female presenting for 1 week history of itching rash over her right forearm.  Patient reports that she was cleaning out her neighbors trash that came onto her yard and subsequently developed this rash on her arm.  She has been using an old nystatin triamcinolone cream with minimal relief.  She went to the pharmacist and asked what medicine she could use, was advised to use hydrocortisone over-the-counter.  This was not helping and so she asked about using her fluocinonide but was told that it was too strong.  Yesterday she started using it with good relief.  Denies pain, swelling, drainage of pus or bleeding.  He has a dermatologist, Dr. Ronnald Ramp, which she plans on following up with soon.  [name] has a current medication list which includes the following prescription(s): aspirin, calcium carbonate-vitamin d, cholecalciferol, cyanocobalamin, niacin, fish oil, one-a-day scooby-doo gummies, and probiotic product. Also is allergic to risedronate sodium; cephalexin; and hydrocodone.  [name]  has a past medical history of Cataract, colonic polyps, Hypercholesterolemia, Lumbar back pain, Neck pain, Osteoporosis, and Swelling of ankle (01/12/16). Also  has a past surgical history that includes Lumbar laminectomy (1985); surgery for removal of rectal polyp (07/1995); Abdominal hysterectomy (12/2003); cataract surgery; and Tubal ligation.  Objective:   Vitals: BP 118/84   Pulse 74   Temp 97.9 F (36.6 C)   Resp 16   Ht 4\' 11"  (1.499 m)   Wt 105 lb 6.4 oz (47.8 kg)   SpO2 96%   BMI 21.29 kg/m   Physical Exam  Constitutional: She is oriented to person, place, and time. She appears well-developed and well-nourished.  Cardiovascular: Normal rate.  Pulmonary/Chest: Effort normal.  Neurological: She is alert and oriented to person, place, and time.  Skin: Skin is warm and dry.      Assessment and Plan :   Rash and  nonspecific skin eruption  Irritant contact dermatitis due to other agents  Itching  We will start betamethasone with Zyrtec and Zantac to address to contact dermatitis.  Patient is to follow-up with her dermatologist, Dr. Ronnald Ramp.  Selena Eagles, PA-C [facility] [phone] 06/20/2018  1:38 PM

## 2018-06-22 ENCOUNTER — Other Ambulatory Visit: Payer: Medicare Other | Admitting: *Deleted

## 2018-06-22 DIAGNOSIS — E782 Mixed hyperlipidemia: Secondary | ICD-10-CM

## 2018-06-22 LAB — COMPREHENSIVE METABOLIC PANEL
ALT: 17 IU/L (ref 0–32)
AST: 21 IU/L (ref 0–40)
Albumin/Globulin Ratio: 1.7 (ref 1.2–2.2)
Albumin: 4.2 g/dL (ref 3.5–4.7)
Alkaline Phosphatase: 62 IU/L (ref 39–117)
BUN/Creatinine Ratio: 24 (ref 12–28)
BUN: 20 mg/dL (ref 8–27)
Bilirubin Total: 0.3 mg/dL (ref 0.0–1.2)
CO2: 25 mmol/L (ref 20–29)
Calcium: 9.3 mg/dL (ref 8.7–10.3)
Chloride: 104 mmol/L (ref 96–106)
Creatinine, Ser: 0.85 mg/dL (ref 0.57–1.00)
GFR calc Af Amer: 73 mL/min/{1.73_m2} (ref >59)
GFR calc non Af Amer: 64 mL/min/{1.73_m2} (ref >59)
Globulin, Total: 2.5 g/dL (ref 1.5–4.5)
Glucose: 88 mg/dL (ref 65–99)
Potassium: 4.4 mmol/L (ref 3.5–5.2)
Sodium: 141 mmol/L (ref 134–144)
Total Protein: 6.7 g/dL (ref 6.0–8.5)

## 2018-06-22 LAB — LIPID PANEL
Chol/HDL Ratio: 3.3 ratio (ref 0.0–4.4)
Cholesterol, Total: 208 mg/dL — ABNORMAL HIGH (ref 100–199)
HDL: 63 mg/dL (ref >39)
LDL Calculated: 133 mg/dL — ABNORMAL HIGH (ref 0–99)
Triglycerides: 61 mg/dL (ref 0–149)
VLDL Cholesterol Cal: 12 mg/dL (ref 5–40)

## 2018-06-29 ENCOUNTER — Telehealth: Payer: Self-pay

## 2018-06-29 ENCOUNTER — Encounter: Payer: Self-pay | Admitting: Cardiovascular Disease

## 2018-06-29 ENCOUNTER — Ambulatory Visit (INDEPENDENT_AMBULATORY_CARE_PROVIDER_SITE_OTHER): Payer: Medicare Other | Admitting: Cardiovascular Disease

## 2018-06-29 VITALS — BP 144/74 | HR 63 | Ht 59.0 in | Wt 104.6 lb

## 2018-06-29 DIAGNOSIS — E782 Mixed hyperlipidemia: Secondary | ICD-10-CM

## 2018-06-29 DIAGNOSIS — E785 Hyperlipidemia, unspecified: Secondary | ICD-10-CM

## 2018-06-29 MED ORDER — EZETIMIBE 10 MG PO TABS: 10.0000 mg | ORAL_TABLET | Freq: Every day | ORAL | 3 refills | Status: DC

## 2018-06-29 NOTE — Telephone Encounter (Signed)
Epic was not functioning correctly when the patient was here for her visit. Dr. Burt Knack started her on ZETIA 10 mg daily. She agreed to have fasting labs drawn 11/18. Orders placed.

## 2018-06-29 NOTE — Progress Notes (Signed)
Cardiology Office Note Date:  07/01/2018   ID:  Selena, [name] Mar 23, 1933, MRN [id number]  PCP:  Crist Infante, MD  Cardiologist:  Sherren Mocha, MD    Chief Complaint  Patient presents with  . Follow-up     History of Present Illness: DANEKA [name] is a 84 y.o. female who presents for follow-up evaluation.  The patient has been followed for hyperlipidemia.  She has been opposed to statin medications and has been treated with extended release niacin. Within the past year she discontinued niacin and aspirin. She developed severe flushing with Niacin after she discontinued ASA. Feels well with no specific cardiac complaints. Today, she denies symptoms of palpitations, chest pain, shortness of breath, orthopnea, PND, dizziness, or syncope. She complains of chronic left leg edema, unchanged over the past year.    Past Medical History:  Diagnosis Date  . Cataract   . Hx of colonic polyps   . Hypercholesterolemia   . Lumbar back pain   . Neck pain   . Osteoporosis   . Swelling of ankle 01/12/16   bilateral. Pt is taking lasix x 2 weeks and wearing compression stockings    Past Surgical History:  Procedure Laterality Date  . ABDOMINAL HYSTERECTOMY  12/2003   DR. Neal--bso, A/P repair  . cataract surgery    . LUMBAR LAMINECTOMY  1985   Dr. Gladstone Lighter  . surgery for removal of rectal polyp  07/1995   Dr. Modena Morrow  . TUBAL LIGATION      Current Outpatient Medications  Medication Sig Dispense Refill  . Calcium Carbonate-Vitamin D (CALTRATE 600+D) 600-400 MG-UNIT per tablet Take 1 tablet by mouth 2 (two) times daily.     . cetirizine HCl (ZYRTEC) 5 MG/5ML SOLN Take 5 mLs (5 mg total) by mouth daily. 100 mL 0  . cholecalciferol (VITAMIN D) 1000 UNITS tablet Take 2,000 Units by mouth daily.     . Cyanocobalamin (VITAMIN B 12 PO) Take 1 tablet by mouth daily.    . niacin (NIASPAN) 1000 MG CR tablet Take 1 tablet (1,000 mg total) by mouth daily. 90 tablet 1  . Omega-3 Fatty  Acids (FISH OIL) 300 MG CAPS Take 1 capsule by mouth daily.      . Pediatric Multivit-Minerals-C (ONE-A-DAY SCOOBY-DOO GUMMIES) CHEW Chew 1 tablet by mouth daily.    . Probiotic Product (PROBIOTIC DAILY PO) Take 1 capsule by mouth daily.    Marland Kitchen aspirin 81 MG tablet Take 81 mg by mouth daily.      Marland Kitchen ezetimibe (ZETIA) 10 MG tablet Take 1 tablet (10 mg total) by mouth daily. 90 tablet 3  . ranitidine (ZANTAC) 150 MG tablet Take 1 tablet (150 mg total) by mouth daily. (Patient not taking: Reported on 06/29/2018) 30 tablet 0   No current facility-administered medications for this visit.     Allergies:   Risedronate sodium; Cephalexin; and Hydrocodone   Social History:  The patient  reports that she quit smoking about 63 years ago. Her smoking use included cigarettes. She has a 0.25 pack-year smoking history. She has never used smokeless tobacco. She reports that she drinks about 7.0 standard drinks of alcohol per week. She reports that she does not use drugs.   Family History:  The patient's  family history includes Heart Problems in her brother and sister; Stroke in her mother.    ROS:  Please see the history of present illness. All other systems are reviewed and negative.    PHYSICAL EXAM:  VS:  BP (!) 144/74   Pulse 63   Ht 4\' 11"  (1.499 m)   Wt 104 lb 9.6 oz (47.4 kg)   SpO2 94%   BMI 21.13 kg/m  , BMI Body mass index is 21.13 kg/m. GEN: Well nourished, well developed, in no acute distress  HEENT: normal  Neck: no JVD, no masses. No carotid bruits Cardiac: RRR without murmur or gallop                Respiratory:  clear to auscultation bilaterally, normal work of breathing GI: soft, nontender, nondistended, + BS MS: no deformity or atrophy  Ext: 1+ left pretibial edema, pedal pulses 2+= bilaterally Skin: warm and dry, no rash Neuro:  Strength and sensation are intact Psych: euthymic mood, full affect  EKG:  EKG is ordered today. The ekg ordered today shows NSR 63 bpm, within  normal limits  Recent Labs: 06/22/2018: ALT 17; BUN 20; Creatinine, Ser 0.85; Potassium 4.4; Sodium 141   Lipid Panel     Component Value Date/Time   CHOL 208 (H) 06/22/2018 0752   TRIG 61 06/22/2018 0752   HDL 63 06/22/2018 0752   CHOLHDL 3.3 06/22/2018 0752   CHOLHDL 2.3 04/19/2016 0736   VLDL 10 04/19/2016 0736   LDLCALC 133 (H) 06/22/2018 0752   LDLDIRECT 158.8 11/14/2011 0733      Wt Readings from Last 3 Encounters:  06/29/18 104 lb 9.6 oz (47.4 kg)  06/20/18 105 lb 6.4 oz (47.8 kg)  03/24/18 103 lb (46.7 kg)    ASSESSMENT AND PLAN: 1.  Hyperlipidemia, mixed: now off of ASA and niacin. LDL 133 on recent labs.  Reviewed lack of data for primary prevention and bleeding risk associated with ASA. She favors aggressive Rx for her lipids, but will not take a statin agent. Will try ezetimibe 10 mg daily with FU labs in 8-12 weeks.   2. lymphedema left leg: compression as tolerated. No change in symptoms over the past year.   Current medicines are reviewed with the patient today.  The patient does not have concerns regarding medicines.  Labs/ tests ordered today include:  No orders of the defined types were placed in this encounter.   Disposition:   FU one year  Signed, Sherren Mocha, MD  07/01/2018 11:39 PM    [address], [place], [place]  [address] Phone: [phone]; Fax: [phone]

## 2018-06-29 NOTE — Patient Instructions (Addendum)
Medication Instructions:  1) START ZETIA 10 mg daily  Labwork: Your provider recommends that you return for FASTING lab work on Monday, November 18. You may come anytime between 8:00AM and 5:00PM as long as you are fasting - Just make sure to check in first!  Testing/Procedures: None  Follow-Up: Your provider wants you to follow-up in: 1 year with Dr. Burt Knack. You will receive a reminder letter in the mail two months in advance. If you don't receive a letter, please call our office to schedule the follow-up appointment.    Any Other Special Instructions Will Be Listed Below (If Applicable).     If you need a refill on your cardiac medications before your next appointment, please call your pharmacy.

## 2018-07-20 ENCOUNTER — Encounter: Payer: Self-pay | Admitting: Emergency Medicine

## 2018-07-20 ENCOUNTER — Other Ambulatory Visit: Payer: Self-pay

## 2018-07-20 ENCOUNTER — Ambulatory Visit (INDEPENDENT_AMBULATORY_CARE_PROVIDER_SITE_OTHER): Payer: Medicare Other | Admitting: Emergency Medicine

## 2018-07-20 VITALS — BP 139/61 | HR 65 | Temp 99.3°F | Resp 16 | Ht 58.5 in | Wt 105.0 lb

## 2018-07-20 DIAGNOSIS — R05 Cough: Secondary | ICD-10-CM

## 2018-07-20 DIAGNOSIS — M542 Cervicalgia: Secondary | ICD-10-CM | POA: Diagnosis not present

## 2018-07-20 DIAGNOSIS — J069 Acute upper respiratory infection, unspecified: Secondary | ICD-10-CM

## 2018-07-20 DIAGNOSIS — R059 Cough, unspecified: Secondary | ICD-10-CM

## 2018-07-20 MED ORDER — AZITHROMYCIN 250 MG PO TABS: ORAL_TABLET | ORAL | 0 refills | Status: DC

## 2018-07-20 NOTE — Patient Instructions (Addendum)
   If you have lab work done today you will be contacted with your lab results within the next 2 weeks.  If you have not heard from us then please contact us. The fastest way to get your results is to register for My Chart.   IF you received an x-ray today, you will receive an invoice from [facility]. Please contact [facility] at [phone] with questions or concerns regarding your invoice.   IF you received labwork today, you will receive an invoice from [facility]. Please contact [facility] at [phone] with questions or concerns regarding your invoice.   Our billing staff will not be able to assist you with questions regarding bills from these companies.  You will be contacted with the lab results as soon as they are available. The fastest way to get your results is to activate your My Chart account. Instructions are located on the last page of this paperwork. If you have not heard from us regarding the results in 2 weeks, please contact this office.     Upper Respiratory Infection, Adult Most upper respiratory infections (URIs) are caused by a virus. A URI affects the nose, throat, and upper air passages. The most common type of URI is often called "the common cold." Follow these instructions at home:  Take medicines only as told by your doctor.  Gargle warm saltwater or take cough drops to comfort your throat as told by your doctor.  Use a warm mist humidifier or inhale steam from a shower to increase air moisture. This may make it easier to breathe.  Drink enough fluid to keep your pee (urine) clear or pale yellow.  Eat soups and other clear broths.  Have a healthy diet.  Rest as needed.  Go back to work when your fever is gone or your doctor says it is okay. ? You may need to stay home longer to avoid giving your URI to others. ? You can also wear a face mask and wash your hands often to prevent spread of the virus.  Use your inhaler more if you  have asthma.  Do not use any tobacco products, including cigarettes, chewing tobacco, or electronic cigarettes. If you need help quitting, ask your doctor. Contact a doctor if:  You are getting worse, not better.  Your symptoms are not helped by medicine.  You have chills.  You are getting more short of breath.  You have brown or red mucus.  You have yellow or brown discharge from your nose.  You have pain in your face, especially when you bend forward.  You have a fever.  You have puffy (swollen) neck glands.  You have pain while swallowing.  You have white areas in the back of your throat. Get help right away if:  You have very bad or constant: ? Headache. ? Ear pain. ? Pain in your forehead, behind your eyes, and over your cheekbones (sinus pain). ? Chest pain.  You have long-lasting (chronic) lung disease and any of the following: ? Wheezing. ? Long-lasting cough. ? Coughing up blood. ? A change in your usual mucus.  You have a stiff neck.  You have changes in your: ? Vision. ? Hearing. ? Thinking. ? Mood. This information is not intended to replace advice given to you by your health care provider. Make sure you discuss any questions you have with your health care provider. Document Released: 04/15/2008 Document Revised: 06/30/2016 Document Reviewed: 02/02/2014 Elsevier Interactive Patient Education  2018 Elsevier   Inc.  

## 2018-07-20 NOTE — Progress Notes (Signed)
[name] 84 y.o.   Chief Complaint  Patient presents with  . Cough    x 2 days nonproductive with sorethroat  . Neck Pain    back of the neck    HISTORY OF PRESENT ILLNESS: This is a 84 y.o. female complaining of 2-day history of sore throat with neck soreness and cough.  No other significant symptoms.  HPI   Prior to Admission medications   Medication Sig Start Date End Date Taking? Authorizing Provider  Calcium Carbonate-Vitamin D (CALTRATE 600+D) 600-400 MG-UNIT per tablet Take 1 tablet by mouth 2 (two) times daily.    Yes [provider]  cholecalciferol (VITAMIN D) 1000 UNITS tablet Take 2,000 Units by mouth daily.    Yes [provider]  Cyanocobalamin (VITAMIN B 12 PO) Take 1 tablet by mouth daily.   Yes [provider]  ezetimibe (ZETIA) 10 MG tablet Take 1 tablet (10 mg total) by mouth daily. 06/29/18 06/24/19 Yes Sherren Mocha, MD  Omega-3 Fatty Acids (FISH OIL) 300 MG CAPS Take 1 capsule by mouth daily.     Yes [provider]  Pediatric Multivit-Minerals-C (ONE-A-DAY SCOOBY-DOO GUMMIES) CHEW Chew 1 tablet by mouth daily.   Yes [provider]  Probiotic Product (PROBIOTIC DAILY PO) Take 1 capsule by mouth daily.   Yes [provider]  aspirin 81 MG tablet Take 81 mg by mouth daily.      [provider]  cetirizine HCl (ZYRTEC) 5 MG/5ML SOLN Take 5 mLs (5 mg total) by mouth daily. Patient not taking: Reported on 07/20/2018 06/20/18   Jaynee Eagles, PA-C  niacin (NIASPAN) 1000 MG CR tablet Take 1 tablet (1,000 mg total) by mouth daily. Patient not taking: Reported on 07/20/2018 12/08/17   Sherren Mocha, MD  ranitidine (ZANTAC) 150 MG tablet Take 1 tablet (150 mg total) by mouth daily. Patient not taking: Reported on 06/29/2018 06/20/18   Jaynee Eagles, PA-C    Allergies  Allergen Reactions  . Risedronate Sodium Other (See Comments)    REACTION: INTOL to Actonel  . Cephalexin Other (See Comments)    Upset  stomach and made very tired  . Hydrocodone Nausea Only    REACTION: nausea    Patient Active Problem List   Diagnosis Date Noted  . Palpitations 12/04/2012  . Chest pain 11/20/2012  . DJD (degenerative joint disease) 11/22/2011  . NECK PAIN 03/01/2008  . BACK PAIN, LUMBAR 03/01/2008  . COLONIC POLYPS 02/29/2008  . HYPERCHOLESTEROLEMIA 02/29/2008  . OSTEOPOROSIS 02/29/2008    Past Medical History:  Diagnosis Date  . Cataract   . Hx of colonic polyps   . Hypercholesterolemia   . Lumbar back pain   . Neck pain   . Osteoporosis   . Swelling of ankle 01/12/16   bilateral. Pt is taking lasix x 2 weeks and wearing compression stockings    Past Surgical History:  Procedure Laterality Date  . ABDOMINAL HYSTERECTOMY  12/2003   DR. Neal--bso, A/P repair  . cataract surgery    . LUMBAR LAMINECTOMY  1985   Dr. Gladstone Lighter  . surgery for removal of rectal polyp  07/1995   Dr. Modena Morrow  . TUBAL LIGATION      Social History   Socioeconomic History  . Marital status: Widowed    Spouse name: Not on file  . Number of children: 7  . Years of education: Not on file  . Highest education level: Not on file  Occupational History  . Occupation: retired  Social Needs  . Financial resource strain: Not on file  . Food insecurity:    Worry: Not on file    Inability: Not on file  . Transportation needs:    Medical: Not on file    Non-medical: Not on file  Tobacco Use  . Smoking status: Former Smoker    Packs/day: 0.25    Years: 1.00    Pack years: 0.25    Types: Cigarettes    Last attempt to quit: 11/11/1954    Years since quitting: 63.7  . Smokeless tobacco: Never Used  Substance and Sexual Activity  . Alcohol use: Yes    Alcohol/week: 7.0 standard drinks    Types: 7 Standard drinks or equivalent per week    Comment: wine with dinner  . Drug use: No  . Sexual activity: Never    Birth control/protection: Abstinence  Lifestyle  . Physical activity:    Days per week: Not on file      Minutes per session: Not on file  . Stress: Not on file  Relationships  . Social connections:    Talks on phone: Not on file    Gets together: Not on file    Attends religious service: Not on file    Active member of club or organization: Not on file    Attends meetings of clubs or organizations: Not on file    Relationship status: Not on file  . Intimate partner violence:    Fear of current or ex partner: Not on file    Emotionally abused: Not on file    Physically abused: Not on file    Forced sexual activity: Not on file  Other Topics Concern  . Not on file  Social History Narrative  . Not on file    Family History  Problem Relation Age of Onset  . Stroke Mother   . Heart Problems Sister        triple bipass  . Heart Problems Brother        blocked arteries, stent.     Review of Systems  Constitutional: Negative.  Negative for chills and fever.  HENT: Positive for congestion and sore throat.   Eyes: Negative.  Negative for blurred vision.  Respiratory: Positive for cough. Negative for hemoptysis, shortness of breath and wheezing.   Cardiovascular: Negative.  Negative for chest pain and palpitations.  Gastrointestinal: Negative for abdominal pain, diarrhea, nausea and vomiting.  Genitourinary: Negative.  Negative for dysuria and hematuria.  Musculoskeletal: Positive for neck pain.  Skin: Negative.  Negative for rash.  Neurological: Negative.  Negative for dizziness and headaches.  Endo/Heme/Allergies: Negative.   All other systems reviewed and are negative.   Vitals:   07/20/18 1646  BP: 139/61  Pulse: 65  Resp: 16  Temp: 99.3 F (37.4 C)  SpO2: 98%    Physical Exam  Constitutional: She is oriented to person, place, and time. She appears well-developed and well-nourished.  HENT:  Head: Normocephalic and atraumatic.  Right Ear: External ear normal.  Left Ear: External ear normal.  Nose: Nose normal.  Mouth/Throat: Oropharynx is clear and moist. No  oropharyngeal exudate.  Eyes: Pupils are equal, round, and reactive to light. Conjunctivae and EOM are normal.  Neck: Normal range of motion. Neck supple. No JVD present.  Cardiovascular: Normal rate, regular rhythm and normal heart sounds.  Pulmonary/Chest: Effort normal and breath sounds normal.  Abdominal: Soft. There is no tenderness.  Musculoskeletal: Normal range of motion. She exhibits no edema or  tenderness.  Lymphadenopathy:    She has no cervical adenopathy.  Neurological: She is alert and oriented to person, place, and time.  Skin: Skin is warm and dry. Capillary refill takes less than 2 seconds.  Psychiatric: She has a normal mood and affect. Her behavior is normal.  Vitals reviewed.  A total of 25 minutes was spent in the room with the patient, greater than 50% of which was in counseling/coordination of care regarding differential diagnosis, treatment, medications, and need for follow-up if no better or worse.   ASSESSMENT & PLAN: Neliah was seen today for cough and neck pain.  Diagnoses and all orders for this visit:  Cough  Acute upper respiratory infection -     azithromycin (ZITHROMAX) 250 MG tablet; Sig as indicated  Sore neck     Patient Instructions       If you have lab work done today you will be contacted with your lab results within the next 2 weeks.  If you have not heard from [place] then please contact us. The fastest way to get your results is to register for My Chart.   IF you received an x-ray today, you will receive an invoice from [facility]. Please contact [facility] at [phone] with questions or concerns regarding your invoice.   IF you received labwork today, you will receive an invoice from [facility]. Please contact [facility] at [phone] with questions or concerns regarding your invoice.   Our billing staff will not be able to assist you with questions regarding bills from these companies.  You will be contacted  with the lab results as soon as they are available. The fastest way to get your results is to activate your My Chart account. Instructions are located on the last page of this paperwork. If you have not heard from [place] regarding the results in 2 weeks, please contact this office.     Upper Respiratory Infection, Adult Most upper respiratory infections (URIs) are caused by a virus. A URI affects the nose, throat, and upper air passages. The most common type of URI is often called "the common cold." Follow these instructions at home:  Take medicines only as told by your doctor.  Gargle warm saltwater or take cough drops to comfort your throat as told by your doctor.  Use a warm mist humidifier or inhale steam from a shower to increase air moisture. This may make it easier to breathe.  Drink enough fluid to keep your pee (urine) clear or pale yellow.  Eat soups and other clear broths.  Have a healthy diet.  Rest as needed.  Go back to work when your fever is gone or your doctor says it is okay. ? You may need to stay home longer to avoid giving your URI to others. ? You can also wear a face mask and wash your hands often to prevent spread of the virus.  Use your inhaler more if you have asthma.  Do not use any tobacco products, including cigarettes, chewing tobacco, or electronic cigarettes. If you need help quitting, ask your doctor. Contact a doctor if:  You are getting worse, not better.  Your symptoms are not helped by medicine.  You have chills.  You are getting more short of breath.  You have brown or red mucus.  You have yellow or brown discharge from your nose.  You have pain in your face, especially when you bend forward.  You have a fever.  You have puffy (swollen) neck  glands.  You have pain while swallowing.  You have white areas in the back of your throat. Get help right away if:  You have very bad or constant: ? Headache. ? Ear pain. ? Pain in your  forehead, behind your eyes, and over your cheekbones (sinus pain). ? Chest pain.  You have long-lasting (chronic) lung disease and any of the following: ? Wheezing. ? Long-lasting cough. ? Coughing up blood. ? A change in your usual mucus.  You have a stiff neck.  You have changes in your: ? Vision. ? Hearing. ? Thinking. ? Mood. This information is not intended to replace advice given to you by your health care provider. Make sure you discuss any questions you have with your health care provider. Document Released: 04/15/2008 Document Revised: 06/30/2016 Document Reviewed: 02/02/2014 Elsevier Interactive Patient Education  2018 Elsevier Inc.      Agustina Caroli, MD [facility]

## 2018-07-29 DIAGNOSIS — Z23 Encounter for immunization: Secondary | ICD-10-CM | POA: Diagnosis not present

## 2018-08-11 DIAGNOSIS — Z01419 Encounter for gynecological examination (general) (routine) without abnormal findings: Secondary | ICD-10-CM | POA: Diagnosis not present

## 2018-08-11 DIAGNOSIS — Z1231 Encounter for screening mammogram for malignant neoplasm of breast: Secondary | ICD-10-CM | POA: Diagnosis not present

## 2018-08-11 DIAGNOSIS — Z6821 Body mass index (BMI) 21.0-21.9, adult: Secondary | ICD-10-CM | POA: Diagnosis not present

## 2018-08-26 DIAGNOSIS — L570 Actinic keratosis: Secondary | ICD-10-CM | POA: Diagnosis not present

## 2018-08-26 DIAGNOSIS — L738 Other specified follicular disorders: Secondary | ICD-10-CM | POA: Diagnosis not present

## 2018-08-26 DIAGNOSIS — D485 Neoplasm of uncertain behavior of skin: Secondary | ICD-10-CM | POA: Diagnosis not present

## 2018-08-26 DIAGNOSIS — Z85828 Personal history of other malignant neoplasm of skin: Secondary | ICD-10-CM | POA: Diagnosis not present

## 2018-08-26 DIAGNOSIS — L821 Other seborrheic keratosis: Secondary | ICD-10-CM | POA: Diagnosis not present

## 2018-09-25 ENCOUNTER — Encounter (HOSPITAL_COMMUNITY): Payer: Self-pay

## 2018-09-25 ENCOUNTER — Ambulatory Visit (HOSPITAL_COMMUNITY)
Admission: RE | Admit: 2018-09-25 | Discharge: 2018-09-25 | Disposition: A | Discharge status: Home / Self Care | Payer: Medicare Other | Source: Ambulatory Visit | Attending: Internal Medicine | Admitting: Internal Medicine

## 2018-09-25 DIAGNOSIS — M810 Age-related osteoporosis without current pathological fracture: Secondary | ICD-10-CM | POA: Diagnosis not present

## 2018-09-25 MED ORDER — DENOSUMAB 60 MG/ML ~~LOC~~ SOSY
60.0000 mg | PREFILLED_SYRINGE | Freq: Once | SUBCUTANEOUS | Status: AC
  Administered 2018-09-25: 60 mg via SUBCUTANEOUS
  Filled 2018-09-25: qty 1

## 2018-09-25 NOTE — Discharge Instructions (Signed)
Denosumab injection / Prolia What is this medicine? DENOSUMAB (den oh sue mab) slows bone breakdown. Prolia is used to treat osteoporosis in women after menopause and in men. Delton See is used to treat a high calcium level due to cancer and to prevent bone fractures and other bone problems caused by multiple myeloma or cancer bone metastases. Delton See is also used to treat giant cell tumor of the bone. This medicine may be used for other purposes; ask your health care provider or pharmacist if you have questions. COMMON BRAND NAME(S): Prolia, XGEVA What should I tell my health care provider before I take this medicine? They need to know if you have any of these conditions: -dental disease -having surgery or tooth extraction -infection -kidney disease -low levels of calcium or Vitamin D in the blood -malnutrition -on hemodialysis -skin conditions or sensitivity -thyroid or parathyroid disease -an unusual reaction to denosumab, other medicines, foods, dyes, or preservatives -pregnant or trying to get pregnant -breast-feeding How should I use this medicine? This medicine is for injection under the skin. It is given by a health care professional in a hospital or clinic setting. If you are getting Prolia, a special MedGuide will be given to you by the pharmacist with each prescription and refill. Be sure to read this information carefully each time. For Prolia, talk to your pediatrician regarding the use of this medicine in children. Special care may be needed. For Delton See, talk to your pediatrician regarding the use of this medicine in children. While this drug may be prescribed for children as young as 13 years for selected conditions, precautions do apply. Overdosage: If you think you have taken too much of this medicine contact a poison control center or emergency room at once. NOTE: This medicine is only for you. Do not share this medicine with others. What if I miss a dose? It is important not to  miss your dose. Call your doctor or health care professional if you are unable to keep an appointment. What may interact with this medicine? Do not take this medicine with any of the following medications: -other medicines containing denosumab This medicine may also interact with the following medications: -medicines that lower your chance of fighting infection -steroid medicines like prednisone or cortisone This list may not describe all possible interactions. Give your health care provider a list of all the medicines, herbs, non-prescription drugs, or dietary supplements you use. Also tell them if you smoke, drink alcohol, or use illegal drugs. Some items may interact with your medicine. What should I watch for while using this medicine? Visit your doctor or health care professional for regular checks on your progress. Your doctor or health care professional may order blood tests and other tests to see how you are doing. Call your doctor or health care professional for advice if you get a fever, chills or sore throat, or other symptoms of a cold or flu. Do not treat yourself. This drug may decrease your body's ability to fight infection. Try to avoid being around people who are sick. You should make sure you get enough calcium and vitamin D while you are taking this medicine, unless your doctor tells you not to. Discuss the foods you eat and the vitamins you take with your health care professional. See your dentist regularly. Brush and floss your teeth as directed. Before you have any dental work done, tell your dentist you are receiving this medicine. Do not become pregnant while taking this medicine or for 5 months  after stopping it. Talk with your doctor or health care professional about your birth control options while taking this medicine. Women should inform their doctor if they wish to become pregnant or think they might be pregnant. There is a potential for serious side effects to an unborn  child. Talk to your health care professional or pharmacist for more information. What side effects may I notice from receiving this medicine? Side effects that you should report to your doctor or health care professional as soon as possible: -allergic reactions like skin rash, itching or hives, swelling of the face, lips, or tongue -bone pain -breathing problems -dizziness -jaw pain, especially after dental work -redness, blistering, peeling of the skin -signs and symptoms of infection like fever or chills; cough; sore throat; pain or trouble passing urine -signs of low calcium like fast heartbeat, muscle cramps or muscle pain; pain, tingling, numbness in the hands or feet; seizures -unusual bleeding or bruising -unusually weak or tired Side effects that usually do not require medical attention (report to your doctor or health care professional if they continue or are bothersome): -constipation -diarrhea -headache -joint pain -loss of appetite -muscle pain -runny nose -tiredness -upset stomach This list may not describe all possible side effects. Call your doctor for medical advice about side effects. You may report side effects to FDA at 1-800-FDA-1088. Where should I keep my medicine? This medicine is only given in a clinic, doctor's office, or other health care setting and will not be stored at home. NOTE: This sheet is a summary. It may not cover all possible information. If you have questions about this medicine, talk to your doctor, pharmacist, or health care provider.  2018 Elsevier/Gold Standard (2016-11-19 19:17:21)

## 2018-09-28 ENCOUNTER — Other Ambulatory Visit: Payer: Medicare Other | Admitting: *Deleted

## 2018-09-28 DIAGNOSIS — E785 Hyperlipidemia, unspecified: Secondary | ICD-10-CM

## 2018-09-28 LAB — LIPID PANEL
Chol/HDL Ratio: 2.9 ratio (ref 0.0–4.4)
Cholesterol, Total: 182 mg/dL (ref 100–199)
HDL: 63 mg/dL (ref >39)
LDL Calculated: 108 mg/dL — ABNORMAL HIGH (ref 0–99)
Triglycerides: 53 mg/dL (ref 0–149)
VLDL Cholesterol Cal: 11 mg/dL (ref 5–40)

## 2018-09-28 LAB — HEPATIC FUNCTION PANEL
ALT: 17 IU/L (ref 0–32)
AST: 20 IU/L (ref 0–40)
Albumin: 4 g/dL (ref 3.5–4.7)
Alkaline Phosphatase: 65 IU/L (ref 39–117)
Bilirubin Total: 0.3 mg/dL (ref 0.0–1.2)
Bilirubin, Direct: 0.11 mg/dL (ref 0.00–0.40)
Total Protein: 6.3 g/dL (ref 6.0–8.5)

## 2018-10-12 ENCOUNTER — Other Ambulatory Visit: Payer: Self-pay | Admitting: Podiatry

## 2018-10-12 ENCOUNTER — Ambulatory Visit (INDEPENDENT_AMBULATORY_CARE_PROVIDER_SITE_OTHER): Payer: Medicare Other

## 2018-10-12 ENCOUNTER — Encounter: Payer: Self-pay | Admitting: Podiatry

## 2018-10-12 ENCOUNTER — Ambulatory Visit (INDEPENDENT_AMBULATORY_CARE_PROVIDER_SITE_OTHER): Payer: Medicare Other | Admitting: Podiatry

## 2018-10-12 DIAGNOSIS — M79672 Pain in left foot: Secondary | ICD-10-CM | POA: Diagnosis not present

## 2018-10-12 DIAGNOSIS — M779 Enthesopathy, unspecified: Secondary | ICD-10-CM

## 2018-10-12 DIAGNOSIS — I7300 Raynaud's syndrome without gangrene: Secondary | ICD-10-CM

## 2018-10-12 DIAGNOSIS — I999 Unspecified disorder of circulatory system: Secondary | ICD-10-CM

## 2018-10-12 MED ORDER — TRIAMCINOLONE ACETONIDE 10 MG/ML IJ SUSP: 10.0000 mg | Freq: Once | INTRAMUSCULAR | Status: DC

## 2018-10-12 NOTE — Patient Instructions (Signed)
Raynaud Phenomenon Raynaud phenomenon is a condition that affects the blood vessels (arteries) that carry blood to your fingers and toes. The arteries that supply blood to your ears or the tip of your nose might also be affected. Raynaud phenomenon causes the arteries to temporarily narrow. As a result, the flow of blood to the affected areas is temporarily decreased. This usually occurs in response to cold temperatures or stress. During an attack, the skin in the affected areas turns white. You may also feel tingling or numbness in those areas. Attacks usually last for only a brief period, and then the blood flow to the area returns to normal. In most cases, Raynaud phenomenon does not cause serious health problems. What are the causes? For many people with this condition, the cause is not known. Raynaud phenomenon is sometimes associated with other diseases, such as scleroderma or lupus. What increases the risk? Raynaud phenomenon can affect anyone, but it develops most often in people who are 20-40 years old. It affects more females than males. What are the signs or symptoms? Symptoms of Raynaud phenomenon may occur when you are exposed to cold temperatures or when you have emotional stress. The symptoms may last for a few minutes or up to several hours. They usually affect your fingers but may also affect your toes, ears, or the tip of your nose. Symptoms may include:  Changes in skin color. The skin in the affected areas will turn pale or white. The skin may then change from white to bluish to red as normal blood flow returns to the area.  Numbness, tingling, or pain in the affected areas.  In severe cases, sores may develop in the affected areas. How is this diagnosed? Your health care provider will do a physical exam and take your medical history. You may be asked to put your hands in cold water to check for a reaction to cold temperature. Blood tests may be done to check for other diseases or  conditions. Your health care provider may also order a test to check the movement of blood through your arteries and veins (vascular ultrasound). How is this treated? Treatment often involves making lifestyle changes and taking steps to control your exposure to cold temperatures. For more severe cases, medicine (calcium channel blockers) may be used to improve blood flow. Surgery is sometimes done to block the nerves that control the affected arteries, but this is rare. Follow these instructions at home:  Avoid exposure to cold by taking these steps: ? If possible, stay indoors during cold weather. ? When you go outside during cold weather, dress in layers and wear mittens, a hat, a scarf, and warm footwear. ? Wear mittens or gloves when handling ice or frozen food. ? Use holders for glasses or cans containing cold drinks. ? Let warm water run for a while before taking a shower or bath. ? Warm up the car before driving in cold weather.  If possible, avoid stressful and emotional situations. Exercise, meditation, and yoga may help you cope with stress. Biofeedback may be useful.  Do not use any tobacco products, including cigarettes, chewing tobacco, or electronic cigarettes. If you need help quitting, ask your health care provider.  Avoid secondhand smoke.  Limit your use of caffeine. Switch to decaffeinated coffee, tea, and soda. Avoid chocolate.  Wear loose fitting socks and comfortable, roomy shoes.  Avoid vibrating tools and machinery.  Take medicines only as directed by your health care provider. Contact a health care provider if:    Your discomfort becomes worse despite lifestyle changes.  You develop sores on your fingers or toes that do not heal.  Your fingers or toes turn black.  You have breaks in the skin on your fingers or toes.  You have a fever.  You have pain or swelling in your joints.  You have a rash.  Your symptoms occur on only one side of your body. This  information is not intended to replace advice given to you by your health care provider. Make sure you discuss any questions you have with your health care provider. Document Released: 10/25/2000 Document Revised: 04/04/2016 Document Reviewed: 05/01/2016 Elsevier Interactive Patient Education  2017 [employer] Inc[employer]  

## 2018-10-13 NOTE — Progress Notes (Signed)
Subjective:   Patient ID: [name], female   DOB: 84 y.o.   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  Proximal nerve block left administered and I aspirated the second MPJ getting out a small amount of clear fluid injected quarter cc dexamethasone Kenalog and applied a pad to try to reduce frontal pressure and did discuss orthotics of the soft nature depending on the response to this medication  X-rays indicate no signs of stress fracture or advanced arthritis

## 2018-10-26 ENCOUNTER — Ambulatory Visit (INDEPENDENT_AMBULATORY_CARE_PROVIDER_SITE_OTHER): Payer: Medicare Other | Admitting: Podiatry

## 2018-10-26 ENCOUNTER — Encounter: Payer: Self-pay | Admitting: Podiatry

## 2018-10-26 DIAGNOSIS — M2042 Other hammer toe(s) (acquired), left foot: Secondary | ICD-10-CM | POA: Diagnosis not present

## 2018-10-26 DIAGNOSIS — I999 Unspecified disorder of circulatory system: Secondary | ICD-10-CM | POA: Diagnosis not present

## 2018-10-26 DIAGNOSIS — M779 Enthesopathy, unspecified: Secondary | ICD-10-CM

## 2018-10-28 NOTE — Progress Notes (Signed)
Subjective:   Patient ID: [name], female   DOB: 84 y.o.   MRN: [id number]   HPI Patient states she is feeling much better with significant diminishment of her discomfort   ROS      Objective:  Physical Exam  Neurovascular status intact with patient's left foot improved with mild discomfort still noted but quite a bit better than it was previously     Assessment:  Improvement of capsulitis left with digital deformity also present     Plan:  Reviewed condition recommended continued padding therapy anti-inflammatories and patient will be seen back as needed and also made numerous discussion and changes with shoe gear and the types of activities she chooses to perform

## 2018-12-21 DIAGNOSIS — M7042 Prepatellar bursitis, left knee: Secondary | ICD-10-CM | POA: Diagnosis not present

## 2018-12-21 DIAGNOSIS — M25462 Effusion, left knee: Secondary | ICD-10-CM | POA: Diagnosis not present

## 2018-12-21 DIAGNOSIS — M25562 Pain in left knee: Secondary | ICD-10-CM | POA: Diagnosis not present

## 2018-12-31 ENCOUNTER — Other Ambulatory Visit: Payer: Self-pay | Admitting: *Deleted

## 2018-12-31 MED ORDER — EZETIMIBE 10 MG PO TABS: 10.0000 mg | ORAL_TABLET | Freq: Every day | ORAL | 1 refills | Status: DC

## 2019-01-15 DIAGNOSIS — H52223 Regular astigmatism, bilateral: Secondary | ICD-10-CM | POA: Diagnosis not present

## 2019-01-15 DIAGNOSIS — H26492 Other secondary cataract, left eye: Secondary | ICD-10-CM | POA: Diagnosis not present

## 2019-01-15 DIAGNOSIS — H5213 Myopia, bilateral: Secondary | ICD-10-CM | POA: Diagnosis not present

## 2019-01-15 DIAGNOSIS — Z961 Presence of intraocular lens: Secondary | ICD-10-CM | POA: Diagnosis not present

## 2019-01-27 DIAGNOSIS — L738 Other specified follicular disorders: Secondary | ICD-10-CM | POA: Diagnosis not present

## 2019-01-27 DIAGNOSIS — Z85828 Personal history of other malignant neoplasm of skin: Secondary | ICD-10-CM | POA: Diagnosis not present

## 2019-01-27 DIAGNOSIS — L570 Actinic keratosis: Secondary | ICD-10-CM | POA: Diagnosis not present

## 2019-01-27 DIAGNOSIS — L821 Other seborrheic keratosis: Secondary | ICD-10-CM | POA: Diagnosis not present

## 2019-01-27 DIAGNOSIS — L820 Inflamed seborrheic keratosis: Secondary | ICD-10-CM | POA: Diagnosis not present

## 2019-03-15 DIAGNOSIS — Z79899 Other long term (current) drug therapy: Secondary | ICD-10-CM | POA: Diagnosis not present

## 2019-03-15 DIAGNOSIS — M810 Age-related osteoporosis without current pathological fracture: Secondary | ICD-10-CM | POA: Diagnosis not present

## 2019-03-15 DIAGNOSIS — E7849 Other hyperlipidemia: Secondary | ICD-10-CM | POA: Diagnosis not present

## 2019-03-16 DIAGNOSIS — R82998 Other abnormal findings in urine: Secondary | ICD-10-CM | POA: Diagnosis not present

## 2019-03-22 DIAGNOSIS — Z1331 Encounter for screening for depression: Secondary | ICD-10-CM | POA: Diagnosis not present

## 2019-03-22 DIAGNOSIS — M7989 Other specified soft tissue disorders: Secondary | ICD-10-CM | POA: Diagnosis not present

## 2019-03-22 DIAGNOSIS — M810 Age-related osteoporosis without current pathological fracture: Secondary | ICD-10-CM | POA: Diagnosis not present

## 2019-03-22 DIAGNOSIS — R600 Localized edema: Secondary | ICD-10-CM | POA: Diagnosis not present

## 2019-03-22 DIAGNOSIS — I872 Venous insufficiency (chronic) (peripheral): Secondary | ICD-10-CM | POA: Diagnosis not present

## 2019-03-22 DIAGNOSIS — D7589 Other specified diseases of blood and blood-forming organs: Secondary | ICD-10-CM | POA: Diagnosis not present

## 2019-03-22 DIAGNOSIS — B029 Zoster without complications: Secondary | ICD-10-CM | POA: Diagnosis not present

## 2019-03-22 DIAGNOSIS — E785 Hyperlipidemia, unspecified: Secondary | ICD-10-CM | POA: Diagnosis not present

## 2019-03-22 DIAGNOSIS — F4320 Adjustment disorder, unspecified: Secondary | ICD-10-CM | POA: Diagnosis not present

## 2019-03-22 DIAGNOSIS — Z0000 Encounter for general adult medical examination without abnormal findings: Secondary | ICD-10-CM | POA: Diagnosis not present

## 2019-03-29 ENCOUNTER — Ambulatory Visit (HOSPITAL_COMMUNITY)
Admission: RE | Admit: 2019-03-29 | Discharge: 2019-03-29 | Disposition: A | Discharge status: Home / Self Care | Payer: Medicare Other | Source: Ambulatory Visit | Attending: Internal Medicine | Admitting: Internal Medicine

## 2019-03-29 ENCOUNTER — Encounter (HOSPITAL_COMMUNITY): Payer: Medicare Other

## 2019-03-29 ENCOUNTER — Encounter (HOSPITAL_COMMUNITY): Payer: Self-pay

## 2019-03-29 ENCOUNTER — Other Ambulatory Visit: Payer: Self-pay

## 2019-03-29 ENCOUNTER — Ambulatory Visit (HOSPITAL_COMMUNITY): Payer: Medicare Other

## 2019-03-29 DIAGNOSIS — M810 Age-related osteoporosis without current pathological fracture: Secondary | ICD-10-CM | POA: Diagnosis not present

## 2019-03-29 MED ORDER — DENOSUMAB 60 MG/ML ~~LOC~~ SOSY
60.0000 mg | PREFILLED_SYRINGE | Freq: Once | SUBCUTANEOUS | Status: AC
  Administered 2019-03-29: 60 mg via SUBCUTANEOUS
  Filled 2019-03-29: qty 1

## 2019-03-29 NOTE — Discharge Instructions (Signed)
Denosumab injection °What is this medicine? °DENOSUMAB (den oh sue mab) slows bone breakdown. Prolia is used to treat osteoporosis in women after menopause and in men, and in people who are taking corticosteroids for 6 months or more. Xgeva is used to treat a high calcium level due to cancer and to prevent bone fractures and other bone problems caused by multiple myeloma or cancer bone metastases. Xgeva is also used to treat giant cell tumor of the bone. °This medicine may be used for other purposes; ask your health care provider or pharmacist if you have questions. °COMMON BRAND NAME(S): Prolia, XGEVA °What should I tell my health care provider before I take this medicine? °They need to know if you have any of these conditions: °-dental disease °-having surgery or tooth extraction °-infection °-kidney disease °-low levels of calcium or Vitamin D in the blood °-malnutrition °-on hemodialysis °-skin conditions or sensitivity °-thyroid or parathyroid disease °-an unusual reaction to denosumab, other medicines, foods, dyes, or preservatives °-pregnant or trying to get pregnant °-breast-feeding °How should I use this medicine? °This medicine is for injection under the skin. It is given by a health care professional in a hospital or clinic setting. °A special MedGuide will be given to you before each treatment. Be sure to read this information carefully each time. °For Prolia, talk to your pediatrician regarding the use of this medicine in children. Special care may be needed. For Xgeva, talk to your pediatrician regarding the use of this medicine in children. While this drug may be prescribed for children as young as 13 years for selected conditions, precautions do apply. °Overdosage: If you think you have taken too much of this medicine contact a poison control center or emergency room at once. °NOTE: This medicine is only for you. Do not share this medicine with others. °What if I miss a dose? °It is important not to  miss your dose. Call your doctor or health care professional if you are unable to keep an appointment. °What may interact with this medicine? °Do not take this medicine with any of the following medications: °-other medicines containing denosumab °This medicine may also interact with the following medications: °-medicines that lower your chance of fighting infection °-steroid medicines like prednisone or cortisone °This list may not describe all possible interactions. Give your health care provider a list of all the medicines, herbs, non-prescription drugs, or dietary supplements you use. Also tell them if you smoke, drink alcohol, or use illegal drugs. Some items may interact with your medicine. °What should I watch for while using this medicine? °Visit your doctor or health care professional for regular checks on your progress. Your doctor or health care professional may order blood tests and other tests to see how you are doing. °Call your doctor or health care professional for advice if you get a fever, chills or sore throat, or other symptoms of a cold or flu. Do not treat yourself. This drug may decrease your body's ability to fight infection. Try to avoid being around people who are sick. °You should make sure you get enough calcium and vitamin D while you are taking this medicine, unless your doctor tells you not to. Discuss the foods you eat and the vitamins you take with your health care professional. °See your dentist regularly. Brush and floss your teeth as directed. Before you have any dental work done, tell your dentist you are receiving this medicine. °Do not become pregnant while taking this medicine or for 5 months   after stopping it. Talk with your doctor or health care professional about your birth control options while taking this medicine. Women should inform their doctor if they wish to become pregnant or think they might be pregnant. There is a potential for serious side effects to an unborn  child. Talk to your health care professional or pharmacist for more information. °What side effects may I notice from receiving this medicine? °Side effects that you should report to your doctor or health care professional as soon as possible: °-allergic reactions like skin rash, itching or hives, swelling of the face, lips, or tongue °-bone pain °-breathing problems °-dizziness °-jaw pain, especially after dental work °-redness, blistering, peeling of the skin °-signs and symptoms of infection like fever or chills; cough; sore throat; pain or trouble passing urine °-signs of low calcium like fast heartbeat, muscle cramps or muscle pain; pain, tingling, numbness in the hands or feet; seizures °-unusual bleeding or bruising °-unusually weak or tired °Side effects that usually do not require medical attention (report to your doctor or health care professional if they continue or are bothersome): °-constipation °-diarrhea °-headache °-joint pain °-loss of appetite °-muscle pain °-runny nose °-tiredness °-upset stomach °This list may not describe all possible side effects. Call your doctor for medical advice about side effects. You may report side effects to FDA at 1-800-FDA-1088. °Where should I keep my medicine? °This medicine is only given in a clinic, doctor's office, or other health care setting and will not be stored at home. °NOTE: This sheet is a summary. It may not cover all possible information. If you have questions about this medicine, talk to your doctor, pharmacist, or health care provider. °© 2019 Elsevier/Gold Standard (2018-03-06 16:10:44) ° °

## 2019-03-31 ENCOUNTER — Encounter: Payer: Self-pay | Admitting: Podiatry

## 2019-03-31 ENCOUNTER — Ambulatory Visit (INDEPENDENT_AMBULATORY_CARE_PROVIDER_SITE_OTHER): Payer: Medicare Other

## 2019-03-31 ENCOUNTER — Ambulatory Visit (INDEPENDENT_AMBULATORY_CARE_PROVIDER_SITE_OTHER): Payer: Medicare Other | Admitting: Podiatry

## 2019-03-31 ENCOUNTER — Other Ambulatory Visit: Payer: Self-pay

## 2019-03-31 VITALS — Temp 96.4°F

## 2019-03-31 DIAGNOSIS — M2042 Other hammer toe(s) (acquired), left foot: Secondary | ICD-10-CM | POA: Diagnosis not present

## 2019-03-31 DIAGNOSIS — L84 Corns and callosities: Secondary | ICD-10-CM | POA: Diagnosis not present

## 2019-04-01 NOTE — Progress Notes (Signed)
Subjective:   Patient ID: [name], female   DOB: 85 y.o.   MRN: [id number]   HPI Patient presents stating she is developing a lot of pain at the end of her fifth toe left and states that it has been sore for the last few months.  Admits that she should have come in earlier but she was concerned because of the virus.  Patient is found to have exquisite discomfort in the distal aspect of the left fifth toe and states it is been sore and making it hard for her to wear shoe gear   ROS      Objective:  Physical Exam  Neurovascular status intact with patient found to have significant distal rotation digit 5 left with distal lateral keratotic lesion that is very tender when pressed     Assessment:  Significant digital deformity fifth left with also moderate deformity fourth left with distal lateral keratotic lesion that is very painful when palpated     Plan:  H&P x-ray reviewed and today I did sterile debridement of lesion and discussed the possibility for a distal derotational arthroplasty with exostectomy which may be necessary.  At this point if we can get 6 months to 1 year relief with conservative treatment we will go that route and if it gets worse we will have to consider other treatment options available  X-rays indicate significant distal rotation digit 5 left with distal lateral spur formation

## 2019-04-07 DIAGNOSIS — Z1211 Encounter for screening for malignant neoplasm of colon: Secondary | ICD-10-CM | POA: Diagnosis not present

## 2019-04-07 DIAGNOSIS — Z1212 Encounter for screening for malignant neoplasm of rectum: Secondary | ICD-10-CM | POA: Diagnosis not present

## 2019-04-13 LAB — COLOGUARD: Cologuard: POSITIVE — AB

## 2019-05-07 DIAGNOSIS — Z85828 Personal history of other malignant neoplasm of skin: Secondary | ICD-10-CM | POA: Diagnosis not present

## 2019-05-07 DIAGNOSIS — L718 Other rosacea: Secondary | ICD-10-CM | POA: Diagnosis not present

## 2019-06-02 ENCOUNTER — Ambulatory Visit (INDEPENDENT_AMBULATORY_CARE_PROVIDER_SITE_OTHER): Payer: Medicare Other | Admitting: Gastroenterology

## 2019-06-02 ENCOUNTER — Encounter: Payer: Self-pay | Admitting: Gastroenterology

## 2019-06-02 VITALS — Ht 59.0 in | Wt 105.0 lb

## 2019-06-02 DIAGNOSIS — R195 Other fecal abnormalities: Secondary | ICD-10-CM | POA: Diagnosis not present

## 2019-06-02 NOTE — Progress Notes (Addendum)
This patient contacted our office requesting a physician telemedicine consultation regarding clinical questions and/or test results. Due to COVID restrictions, this was felt to be the most appropriate method of patient evaluation. If new patient, they were referred by Dr. Crist Infante  Participants on the conference : myself and patient   The patient consented to this consultation and was aware that a charge will be placed through their insurance.  They were also made aware of the limitations of telemedicine.  I was in my office and the patient was at home.   Encounter time:  Total time 30 minutes, with 25 minutes spent with patient on Doximity   _____________________________________________________________________________________________              [name] [facility] Consult Note:  History: [name] 06/02/2019  Referring provider: Crist Infante, MD  Reason for consult/chief complaint: positive cologuard (No problems or complaints)   Subjective  HPI: Colonoscopy with Dr. Sharlett Iles in March 2008 for history of colon polyps.  No polyps discovered on the 2008 exam. Multiple hyperplastic polyps on January 2004 colonoscopy.  [name] feels well, and denies chronic abdominal pain, change in bowel habits or rectal bleeding.  Her appetite is reasonably good, though admits it is sometimes difficult to just prepare food for 1 person.  She denies dysphagia odynophagia vomiting or weight loss.  Her primary care provider had her do a Cologuard test, which had a positive result on 03/28/2019.   ROS:  Review of Systems She denies chest pain dyspnea or dysuria Says that she walks at least several miles a day.  Past Medical History: Past Medical History:  Diagnosis Date  . Cataract   . Hx of colonic polyps   . Hypercholesterolemia   . Lumbar back pain   . Neck pain   . Osteoporosis   . Swelling of ankle 01/12/16   bilateral. Pt is taking lasix x 2 weeks and  wearing compression stockings     Past Surgical History: Past Surgical History:  Procedure Laterality Date  . ABDOMINAL HYSTERECTOMY  12/2003   DR. Neal--bso, A/P repair  . cataract surgery    . LUMBAR LAMINECTOMY  1985   Dr. Gladstone Lighter  . surgery for removal of rectal polyp  07/1995   Dr. Modena Morrow  . TUBAL LIGATION       Family History: Family History  Problem Relation Age of Onset  . Stroke Mother   . Heart Problems Sister        triple bipass  . Heart Problems Brother        blocked arteries, stent.  . Stomach cancer Neg Hx   . Colon cancer Neg Hx   . Pancreatic cancer Neg Hx   . Rectal cancer Neg Hx     Social History: Social History   Socioeconomic History  . Marital status: Widowed    Spouse name: Not on file  . Number of children: 7  . Years of education: Not on file  . Highest education level: Not on file  Occupational History  . Occupation: retired  Scientific laboratory technician  . Financial resource strain: Not on file  . Food insecurity    Worry: Not on file    Inability: Not on file  . Transportation needs    Medical: Not on file    Non-medical: Not on file  Tobacco Use  . Smoking status: Former Smoker    Packs/day: 0.25    Years: 1.00    Pack years: 0.25  Types: Cigarettes    Quit date: 11/11/1954    Years since quitting: 64.6  . Smokeless tobacco: Never Used  Substance and Sexual Activity  . Alcohol use: Yes    Alcohol/week: 7.0 standard drinks    Types: 7 Standard drinks or equivalent per week    Comment: wine with dinner  . Drug use: No  . Sexual activity: Never    Birth control/protection: Abstinence  Lifestyle  . Physical activity    Days per week: Not on file    Minutes per session: Not on file  . Stress: Not on file  Relationships  . Social Herbalist on phone: Not on file    Gets together: Not on file    Attends religious service: Not on file    Active member of club or organization: Not on file    Attends meetings of clubs or  organizations: Not on file    Relationship status: Not on file  Other Topics Concern  . Not on file  Social History Narrative  . Not on file    Allergies: Allergies  Allergen Reactions  . Risedronate Sodium Other (See Comments)    REACTION: INTOL to Actonel  . Cephalexin Other (See Comments)    Upset stomach and made very tired  . Hydrocodone Nausea Only    REACTION: nausea    Outpatient Meds: Current Outpatient Medications  Medication Sig Dispense Refill  . Calcium Carbonate-Vitamin D (CALTRATE 600+D) 600-400 MG-UNIT per tablet Take 1 tablet by mouth 2 (two) times daily.     . cholecalciferol (VITAMIN D) 1000 UNITS tablet Take 2,000 Units by mouth daily.     . Cyanocobalamin (VITAMIN B 12 PO) Take 1 tablet by mouth daily.    Marland Kitchen ezetimibe (ZETIA) 10 MG tablet Take 1 tablet (10 mg total) by mouth daily. 90 tablet 1  . Pediatric Multivit-Minerals-C (ONE-A-DAY SCOOBY-DOO GUMMIES) CHEW Chew 1 tablet by mouth daily. Pt takes Caltrate    . Probiotic Product (PROBIOTIC DAILY PO) Take 1 capsule by mouth daily.     Current Facility-Administered Medications  Medication Dose Route Frequency Provider Last Rate Last Dose  . triamcinolone acetonide (KENALOG) 10 MG/ML injection 10 mg  10 mg Other Once Wallene Huh, DPM          ___________________________________________________________________ Objective   Exam:  Ht 4\' 11"  (1.499 m)   Wt 105 lb (47.6 kg)   BMI 21.21 kg/m    No exam-virtual visit.  She is well-appearing  Labs:  Positive Cologuard test as noted above  Labs dated 03/15/2019: Normal CBC and CMP Assessment: Encounter Diagnosis  Name Primary?  . Positive colorectal cancer screening using Cologuard test Yes    While she was offered colorectal cancer screening past the age of current screening guidelines, a positive result should be investigated by colonoscopy.  We discussed how positive Cologuard most often means potentially precancerous colon polyps, less  commonly false positive test or colon cancer.  Plan: She does not wish to proceed with colonoscopy at this point.  She would like to give it further consideration and contact us if she decides to proceed.  If so, we can then discuss risks and benefits.  Thank you for the courtesy of this consult.  Please call me with any questions or concerns.  Nelida Meuse III  CC: Referring provider noted above

## 2019-07-05 ENCOUNTER — Telehealth: Payer: Self-pay | Admitting: Cardiovascular Disease

## 2019-07-05 ENCOUNTER — Other Ambulatory Visit: Payer: Self-pay | Admitting: Cardiovascular Disease

## 2019-07-05 MED ORDER — EZETIMIBE 10 MG PO TABS: 10.0000 mg | ORAL_TABLET | Freq: Every day | ORAL | 0 refills | Status: DC

## 2019-07-05 NOTE — Telephone Encounter (Signed)
Informed patient there are labs on file from May 2020. Instructed her to have evaluation first and if labs are needed they will be ordered at that time. She was grateful for assistance.

## 2019-07-05 NOTE — Telephone Encounter (Signed)
°  New message     Patient requesting order for annual labs

## 2019-07-05 NOTE — Telephone Encounter (Signed)
Pt's medication was sent to pt's pharmacy as requested. Confirmation received.  °

## 2019-07-05 NOTE — Telephone Encounter (Signed)
° ° ° °*  STAT* If patient is at the pharmacy, call can be transferred to refill team.   1. Which medications need to be refilled? (please list name of each medication and dose if known) ezetimibe (ZETIA) 10 MG tablet   2. Which pharmacy/location (including street and city if local pharmacy) is medication to be sent to?[facility] [place], [place]  3. Do they need a 30 day or 90 day supply? [place]

## 2019-07-23 ENCOUNTER — Encounter: Payer: Self-pay | Admitting: Cardiology

## 2019-07-23 ENCOUNTER — Telehealth: Payer: Self-pay | Admitting: Gastroenterology

## 2019-07-23 ENCOUNTER — Ambulatory Visit (INDEPENDENT_AMBULATORY_CARE_PROVIDER_SITE_OTHER): Payer: Medicare Other | Admitting: Cardiology

## 2019-07-23 ENCOUNTER — Other Ambulatory Visit: Payer: Self-pay

## 2019-07-23 VITALS — BP 134/66 | HR 62 | Ht 59.0 in | Wt 104.4 lb

## 2019-07-23 DIAGNOSIS — E785 Hyperlipidemia, unspecified: Secondary | ICD-10-CM

## 2019-07-23 DIAGNOSIS — I890 Lymphedema, not elsewhere classified: Secondary | ICD-10-CM | POA: Diagnosis not present

## 2019-07-23 DIAGNOSIS — Z09 Encounter for follow-up examination after completed treatment for conditions other than malignant neoplasm: Secondary | ICD-10-CM | POA: Diagnosis not present

## 2019-07-23 MED ORDER — EZETIMIBE 10 MG PO TABS: 10.0000 mg | ORAL_TABLET | Freq: Every day | ORAL | 3 refills | Status: DC

## 2019-07-23 NOTE — Progress Notes (Signed)
Cardiology Office Note:    Date:  07/23/2019   ID:  [name], DOB November 29, 1932, MRN [id number]  PCP:  Selena Infante, MD  Cardiologist:  Selena Mocha, MD  Referring MD: Selena Infante, MD   Chief Complaint  Patient presents with  . Follow-up  . Hyperlipidemia    History of Present Illness:    [name] is a 85 y.o. female with a past medical history significant for hyperlipidemia.  She has been opposed to statin medications and has been treated with extended release niacin.  And around 02-16-18 she discontinued niacin and aspirin.  She was having severe flushing with niacin after she discontinued the aspirin.   LDL was up to 133 in 02/16/2018.  Zetia 10 mg daily was added.  Follow-up labs in 09/2018 showed an LDL reduction to 108.  With substantial improvement, patient was advised to continue Zetia therapy.  She has noted to have lymphedema of the left leg and advised on compression as tolerated.  [name] is here today for annual follow up.  She is doing very well.  She walks 5-6 miles per day. 15k steps per day. She lives alone, her husband died in 2017/02/16. She has 7 children 81-64 yrs old. 12 grandchildren. 11 greatgrands, most in [place]. She stays quite active. No chest pain, shortness of breath, orthopnea, PND, palpitations, lightheadedness or syncope.  She does have some lymphedema in her left lower leg.  She was told to wear compression stockings but in the summer heat they are uncomfortable especially when she works outside in her garden.  Past Medical History:  Diagnosis Date  . Cataract   . Hx of colonic polyps   . Hypercholesterolemia   . Lumbar back pain   . Neck pain   . Osteoporosis   . Swelling of ankle 01/12/16   bilateral. Pt is taking lasix x 2 weeks and wearing compression stockings    Past Surgical History:  Procedure Laterality Date  . ABDOMINAL HYSTERECTOMY  12/2003   DR. Neal--bso, A/P repair  . cataract surgery    . LUMBAR LAMINECTOMY  1985   Dr.  Gladstone [name]  . surgery for removal of rectal polyp  07/1995   Dr. Modena [name]  . TUBAL LIGATION      Current Medications: Current Meds  Medication Sig  . Calcium Carbonate-Vitamin D (CALTRATE 600+D) 600-400 MG-UNIT per tablet Take 1 tablet by mouth 2 (two) times daily.   . cholecalciferol (VITAMIN D) 1000 UNITS tablet Take 2,000 Units by mouth daily.   . Cyanocobalamin (VITAMIN B 12 PO) Take 1 tablet by mouth daily.  Marland Kitchen ezetimibe (ZETIA) 10 MG tablet Take 1 tablet (10 mg total) by mouth daily.  . Pediatric Multivit-Minerals-C (ONE-A-DAY SCOOBY-DOO GUMMIES) CHEW Chew 1 tablet by mouth daily. Pt takes Caltrate  . Probiotic Product (PROBIOTIC DAILY PO) Take 1 capsule by mouth daily.  . [DISCONTINUED] ezetimibe (ZETIA) 10 MG tablet Take 1 tablet (10 mg total) by mouth daily. Please keep upcoming appt in September for future refills. Thank you   Current Facility-Administered Medications for the 07/23/19 encounter (Office Visit) with Selena Perch, NP  Medication  . triamcinolone acetonide (KENALOG) 10 MG/ML injection 10 mg     Allergies:   Risedronate sodium, Cephalexin, and Hydrocodone   Social History   Socioeconomic History  . Marital status: Widowed    Spouse name: Not on file  . Number of children: 7  . Years of education: Not on file  . Highest education level:  Not on file  Occupational History  . Occupation: retired  Scientific laboratory technician  . Financial resource strain: Not on file  . Food insecurity    Worry: Not on file    Inability: Not on file  . Transportation needs    Medical: Not on file    Non-medical: Not on file  Tobacco Use  . Smoking status: Former Smoker    Packs/day: 0.25    Years: 1.00    Pack years: 0.25    Types: Cigarettes    Quit date: 11/11/1954    Years since quitting: 64.7  . Smokeless tobacco: Never Used  Substance and Sexual Activity  . Alcohol use: Yes    Alcohol/week: 7.0 standard drinks    Types: 7 Standard drinks or equivalent per week    Comment: wine  with dinner  . Drug use: No  . Sexual activity: Never    Birth control/protection: Abstinence  Lifestyle  . Physical activity    Days per week: Not on file    Minutes per session: Not on file  . Stress: Not on file  Relationships  . Social Herbalist on phone: Not on file    Gets together: Not on file    Attends religious service: Not on file    Active member of club or organization: Not on file    Attends meetings of clubs or organizations: Not on file    Relationship status: Not on file  Other Topics Concern  . Not on file  Social History Narrative  . Not on file     Family History: The patient's family history includes Heart Problems in her brother and sister; Stroke in her mother. There is no history of Stomach cancer, Colon cancer, Pancreatic cancer, or Rectal cancer. ROS:   Please see the history of present illness.     All other systems reviewed and are negative.  EKGs/Labs/Other Studies Reviewed:    The following studies were reviewed today: none  EKG:  EKG is ordered today.  The ekg ordered today demonstrates normal sinus rhythm, 62 bpm, QTC 403  Recent Labs: 09/28/2018: ALT 17   Recent Lipid Panel    Component Value Date/Time   CHOL 182 09/28/2018 0729   TRIG 53 09/28/2018 0729   HDL 63 09/28/2018 0729   CHOLHDL 2.9 09/28/2018 0729   CHOLHDL 2.3 04/19/2016 0736   VLDL 10 04/19/2016 0736   LDLCALC 108 (H) 09/28/2018 0729   LDLDIRECT 158.8 11/14/2011 0733    Physical Exam:    VS:  BP 134/66   Pulse 62   Ht 4\' 11"  (1.499 m)   Wt 104 lb 6.4 oz (47.4 kg)   SpO2 94%   BMI 21.09 kg/m     Wt Readings from Last 3 Encounters:  07/23/19 104 lb 6.4 oz (47.4 kg)  06/02/19 105 lb (47.6 kg)  07/20/18 105 lb (47.6 kg)     Physical Exam  Constitutional: She is oriented to person, place, and time. She appears well-developed and well-nourished. No distress.  HENT:  Head: Normocephalic and atraumatic.  Neck: Normal range of motion. Neck supple.  No JVD present.  Cardiovascular: Normal rate, regular rhythm, normal heart sounds and intact distal pulses. Exam reveals no gallop and no friction rub.  No murmur heard. Pulmonary/Chest: Effort normal and breath sounds normal. No respiratory distress. She has no wheezes. She has no rales.  Abdominal: Soft. Bowel sounds are normal.  Musculoskeletal: Normal range of motion.  Comments: Trace-1+ left lower leg edema, no edema of the right leg  Neurological: She is alert and oriented to person, place, and time.  Skin: Skin is warm and dry.  Psychiatric: She has a normal mood and affect. Her behavior is normal. Judgment and thought content normal.  Vitals reviewed.   ASSESSMENT:    1. Hyperlipidemia, unspecified hyperlipidemia type   2. Follow up   3. Lymphedema    PLAN:    In order of problems listed above:  Hyperlipidemia -Patient has had no history of cardiovascular events.  She does have a family history of cardiac issues.  She is very active, especially for her age, and has no exertional symptoms. -She has been resistant to initiating statin therapy.  She did not like the flushing with niacin.  Last year Dr. Burt Knack initiated Zetia 10 mg daily LDL came down from 133 to 108.  With substantial improvement, patient advised to continue Zetia therapy.  Continue with her current exercise and follow heart healthy diet.  Lymphedema of the left lower leg -Patient has not been wearing compression stockings in the summer because they are uncomfortable in the heat.  We discussed the rationale for compression stocking and she will try to get back to using them once the weather cools off some.  Advised her to elevate her leg as much as she can.  She says she is busy constantly and rarely sits down to be able to elevate it.  Medication Adjustments/Labs and Tests Ordered: Current medicines are reviewed at length with the patient today.  Concerns regarding medicines are outlined above. Labs and tests  ordered and medication changes are outlined in the patient instructions below:  Patient Instructions  Medication Instructions:  Your physician recommends that you continue on your current medications as directed. Please refer to the Current Medication list given to you today.  If you need a refill on your cardiac medications before your next appointment, please call your pharmacy.   Lab work: None   If you have labs (blood work) drawn today and your tests are completely normal, you will receive your results only by: Marland Kitchen MyChart Message (if you have MyChart) OR . A paper copy in the mail If you have any lab test that is abnormal or we need to change your treatment, we will call you to review the results.  Testing/Procedures: None   Follow-Up: At [facility], you and your health needs are our priority.  As part of our continuing mission to provide you with exceptional heart care, we have created designated Provider Care Teams.  These Care Teams include your primary Cardiologist (physician) and Advanced Practice Providers (APPs -  Physician Assistants and Nurse Practitioners) who all work together to provide you with the care you need, when you need it. You will need a follow up appointment in:  12 months.  Please call our office 2 months in advance to schedule this appointment.  You may see Dr. Burt Knack or one of the following Advanced Practice Providers on your designated Care Team: Richardson Dopp, PA-C [place], [place] . Selena Perch, NP  Any Other Special Instructions Will Be Listed Below (If Applicable).   Lifestyle Modifications to Prevent and Treat Heart Disease -Recommend heart healthy/Mediterranean diet with whole grains, fruits, vegetables, fish, lean meats, nuts, olive oil and avocado oil.  -Limit salt intake to less than 1500 mg per day.  -Recommend moderate walking, starting slowly with a few minutes and working up to 3-5 times/week for 30-50  minutes each session. Aim for at  least 150 minutes.week. Goal should be pace of 3 miles/hours, or walking 1.5 miles in 30 minutes -Recommend avoidance of tobacco products. Avoid excess alcohol. -Keep blood pressure well controlled, ideally less than 130/80.      Signed, Selena Perch, NP  07/23/2019 10:33 AM    [place]

## 2019-07-23 NOTE — Patient Instructions (Addendum)
Medication Instructions:  Your physician recommends that you continue on your current medications as directed. Please refer to the Current Medication list given to you today.  If you need a refill on your cardiac medications before your next appointment, please call your pharmacy.   Lab work: None   If you have labs (blood work) drawn today and your tests are completely normal, you will receive your results only by: Marland Kitchen MyChart Message (if you have MyChart) OR . A paper copy in the mail If you have any lab test that is abnormal or we need to change your treatment, we will call you to review the results.  Testing/Procedures: None   Follow-Up: At [facility], you and your health needs are our priority.  As part of our continuing mission to provide you with exceptional heart care, we have created designated Provider Care Teams.  These Care Teams include your primary Cardiologist (physician) and Advanced Practice Providers (APPs -  Physician Assistants and Nurse Practitioners) who all work together to provide you with the care you need, when you need it. You will need a follow up appointment in:  12 months.  Please call our office 2 months in advance to schedule this appointment.  You may see Dr. Burt Knack or one of the following Advanced Practice Providers on your designated Care Team: Richardson Dopp, PA-C [place], [place] . Daune Perch, NP  Any Other Special Instructions Will Be Listed Below (If Applicable).   Lifestyle Modifications to Prevent and Treat Heart Disease -Recommend heart healthy/Mediterranean diet with whole grains, fruits, vegetables, fish, lean meats, nuts, olive oil and avocado oil.  -Limit salt intake to less than 1500 mg per day.  -Recommend moderate walking, starting slowly with a few minutes and working up to 3-5 times/week for 30-50 minutes each session. Aim for at least 150 minutes.week. Goal should be pace of 3 miles/hours, or walking 1.5 miles in 30  minutes -Recommend avoidance of tobacco products. Avoid excess alcohol. -Keep blood pressure well controlled, ideally less than 130/80.

## 2019-07-23 NOTE — Telephone Encounter (Signed)
Dr. Loletha Carrow, it looks like in July, this patient had a virtual appt with you and declined a colonoscopy at the time (positive Cologuard). She now would like to be scheduled. Please advise.

## 2019-07-26 ENCOUNTER — Encounter: Payer: Self-pay | Admitting: *Deleted

## 2019-07-26 NOTE — Telephone Encounter (Signed)
The patient had been scheduled for the following:   08/18/2019 at 8:00 am pre op visit with nurse  09/01/2019 at 8:00 am colon with Dr. Loletha Carrow   Scheduled with the patient. Patient verbalized understanding. No other questions or concerns voiced at the time of the call.

## 2019-07-26 NOTE — Telephone Encounter (Signed)
She can be directly booked for a colonoscopy with me in the [facility].

## 2019-08-05 DIAGNOSIS — Z23 Encounter for immunization: Secondary | ICD-10-CM | POA: Diagnosis not present

## 2019-08-18 ENCOUNTER — Other Ambulatory Visit: Payer: Self-pay

## 2019-08-18 ENCOUNTER — Ambulatory Visit (AMBULATORY_SURGERY_CENTER): Payer: Self-pay | Admitting: *Deleted

## 2019-08-18 VITALS — Temp 96.6°F | Ht 59.0 in | Wt 103.8 lb

## 2019-08-18 DIAGNOSIS — R195 Other fecal abnormalities: Secondary | ICD-10-CM

## 2019-08-18 NOTE — Progress Notes (Signed)
No egg or soy allergy known to patient  No issues with past sedation with any surgeries  or procedures, no intubation problems  No diet pills per patient No home 02 use per patient  No blood thinners per patient  Pt denies issues with constipation  No A fib or A flutter  EMMI video sent to pt's e mail   Due to the COVID-19 pandemic we are asking patients to follow these guidelines. Please only bring one care partner. Please be aware that your care partner may wait in the car in the parking lot or if they feel like they will be too hot to wait in the car, they may wait in the lobby on the 4th floor. All care partners are required to wear a mask the entire time (we do not have any that we can provide them), they need to practice social distancing, and we will do a Covid check for all patient's and care partners when you arrive. Also we will check their temperature and your temperature. If the care partner waits in their car they need to stay in the parking lot the entire time and we will call them on their cell phone when the patient is ready for discharge so they can bring the car to the front of the building. Also all patient's will need to wear a mask into building.  plenvu samples given.

## 2019-08-31 ENCOUNTER — Telehealth: Payer: Self-pay

## 2019-08-31 NOTE — Telephone Encounter (Signed)
Covid-19 screening questions   Do you now or have you had a fever in the last 14 days?  Do you have any respiratory symptoms of shortness of breath or cough now or in the last 14 days?  Do you have any family members or close contacts with diagnosed or suspected Covid-19 in the past 14 days?  Have you been tested for Covid-19 and found to be positive?       

## 2019-09-01 ENCOUNTER — Encounter: Payer: Self-pay | Admitting: Gastroenterology

## 2019-09-01 ENCOUNTER — Other Ambulatory Visit: Payer: Self-pay

## 2019-09-01 ENCOUNTER — Ambulatory Visit (AMBULATORY_SURGERY_CENTER): Payer: Medicare Other | Admitting: Gastroenterology

## 2019-09-01 VITALS — BP 109/52 | HR 60 | Temp 97.8°F | Resp 10 | Ht 59.0 in | Wt 103.8 lb

## 2019-09-01 DIAGNOSIS — D122 Benign neoplasm of ascending colon: Secondary | ICD-10-CM

## 2019-09-01 DIAGNOSIS — K635 Polyp of colon: Secondary | ICD-10-CM

## 2019-09-01 DIAGNOSIS — R195 Other fecal abnormalities: Secondary | ICD-10-CM | POA: Diagnosis not present

## 2019-09-01 DIAGNOSIS — K5730 Diverticulosis of large intestine without perforation or abscess without bleeding: Secondary | ICD-10-CM | POA: Diagnosis not present

## 2019-09-01 DIAGNOSIS — D123 Benign neoplasm of transverse colon: Secondary | ICD-10-CM

## 2019-09-01 MED ORDER — SODIUM CHLORIDE 0.9 % IV SOLN: 500.0000 mL | Freq: Once | INTRAVENOUS | Status: DC

## 2019-09-01 NOTE — Patient Instructions (Signed)
HANDOUTS PROVIDED ON:  POLYPS & DIVERTICULOSIS  THE POLYPS REMOVED TODAY HAVE BEEN SENT TO PATHOLOGY.  THE RESULTS CAN TAKE 10-14 DAYS TO RECEIVE.  NO FOLLOW UP COLONOSCOPIES ARE REQUIRED.  YOU MAY RESUME YOUR PREVIOUS DIET AND MEDICATION SCHEDULE TODAY.  [place] YOU FOR ALLOWING [place] TO CARE FOR YOU!!!  YOU HAD AN ENDOSCOPIC PROCEDURE TODAY AT [facility]:   Refer to the procedure report that was given to you for any specific questions about what was found during the examination.  If the procedure report does not answer your questions, please call your gastroenterologist to clarify.  If you requested that your care partner not be given the details of your procedure findings, then the procedure report has been included in a sealed envelope for you to review at your convenience later.  YOU SHOULD EXPECT: Some feelings of bloating in the abdomen. Passage of more gas than usual.  Walking can help get rid of the air that was put into your GI tract during the procedure and reduce the bloating. If you had a lower endoscopy (such as a colonoscopy or flexible sigmoidoscopy) you may notice spotting of blood in your stool or on the toilet paper. If you underwent a bowel prep for your procedure, you may not have a normal bowel movement for a few days.  Please Note:  You might notice some irritation and congestion in your nose or some drainage.  This is from the oxygen used during your procedure.  There is no need for concern and it should clear up in a day or so.  SYMPTOMS TO REPORT IMMEDIATELY:   Following lower endoscopy (colonoscopy or flexible sigmoidoscopy):  Excessive amounts of blood in the stool  Significant tenderness or worsening of abdominal pains  Swelling of the abdomen that is new, acute  Fever of 100F or higher   For urgent or emergent issues, a gastroenterologist can be reached at any hour by calling [phone].   DIET:  We do recommend a small meal at first, but  then you may proceed to your regular diet.  Drink plenty of fluids but you should avoid alcoholic beverages for 24 hours.  ACTIVITY:  You should plan to take it easy for the rest of today and you should NOT DRIVE or use heavy machinery until tomorrow (because of the sedation medicines used during the test).    FOLLOW UP: Our staff will call the number listed on your records 48-72 hours following your procedure to check on you and address any questions or concerns that you may have regarding the information given to you following your procedure. If we do not reach you, we will leave a message.  We will attempt to reach you two times.  During this call, we will ask if you have developed any symptoms of COVID 19. If you develop any symptoms (ie: fever, flu-like symptoms, shortness of breath, cough etc.) before then, please call [phone].  If you test positive for Covid 19 in the 2 weeks post procedure, please call and report this information to [place].    If any biopsies were taken you will be contacted by phone or by letter within the next 1-3 weeks.  Please call us at [phone] if you have not heard about the biopsies in 3 weeks.    SIGNATURES/CONFIDENTIALITY: You and/or your care partner have signed paperwork which will be entered into your electronic medical record.  These signatures attest to the fact that that the information  above on your After Visit Summary has been reviewed and is understood.  Full responsibility of the confidentiality of this discharge information lies with you and/or your care-partner.

## 2019-09-01 NOTE — Progress Notes (Signed)
PT taken to PACU. Monitors in place. VSS. Report given to RN. 

## 2019-09-01 NOTE — Op Note (Signed)
[place] Patient Name: [name] Procedure Date: 09/01/2019 8:48 AM MRN: [id number] Endoscopist: Mallie Mussel L. Loletha Carrow , MD Age: 85 Referring MD:  Date of Birth: 1933/07/21 Gender: Female Account #: [id number] Procedure:                Colonoscopy Indications:              Positive Cologuard test Medicines:                Monitored Anesthesia Care Procedure:                Pre-Anesthesia Assessment:                           - Prior to the procedure, a History and Physical                            was performed, and patient medications and                            allergies were reviewed. The patient's tolerance of                            previous anesthesia was also reviewed. The risks                            and benefits of the procedure and the sedation                            options and risks were discussed with the patient.                            All questions were answered, and informed consent                            was obtained. Prior Anticoagulants: The patient has                            taken no previous anticoagulant or antiplatelet                            agents. ASA Grade Assessment: II - A patient with                            mild systemic disease. After reviewing the risks                            and benefits, the patient was deemed in                            satisfactory condition to undergo the procedure.                           After obtaining informed consent, the colonoscope  was passed under direct vision. Throughout the                            procedure, the patient's blood pressure, pulse, and                            oxygen saturations were monitored continuously. The                            Colonoscope was introduced through the anus and                            advanced to the the cecum, identified by                            appendiceal orifice and ileocecal valve. The                             colonoscopy was performed without difficulty. The                            patient tolerated the procedure well. The quality                            of the bowel preparation was good. The ileocecal                            valve, appendiceal orifice, and rectum were                            photographed. Scope In: 8:51:20 AM Scope Out: 9:15:41 AM Scope Withdrawal Time: 0 hours 17 minutes 20 seconds  Total Procedure Duration: 0 hours 24 minutes 21 seconds  Findings:                 The digital rectal exam findings include decreased                            sphincter tone.                           A 15 mm polyp was found in the proximal ascending                            colon. The polyp was flat. The polyp was removed                            with a piecemeal technique using a cold snare.                            Resection and retrieval were complete.                           A 6 mm polyp was found in the proximal ascending  colon. The polyp was sessile. The polyp was removed                            with a cold snare. Resection and retrieval were                            complete.                           Two sessile polyps were found in the proximal                            transverse colon. The polyps were 4 to 6 mm in                            size. These polyps were removed with a cold snare.                            Resection and retrieval were complete.                           Multiple diverticula were found in the sigmoid                            colon.                           The exam was otherwise without abnormality on                            direct and retroflexion views. Complications:            No immediate complications. Estimated Blood Loss:     Estimated blood loss was minimal. Impression:               - Decreased sphincter tone found on digital rectal                             exam.                           - One 15 mm polyp in the proximal ascending colon,                            removed piecemeal using a cold snare. Resected and                            retrieved.                           - One 6 mm polyp in the proximal ascending colon,                            removed with a cold snare. Resected and retrieved.                           -  Two 4 to 6 mm polyps in the proximal transverse                            colon, removed with a cold snare. Resected and                            retrieved.                           - Diverticulosis in the sigmoid colon.                           - The examination was otherwise normal on direct                            and retroflexion views. Recommendation:           - Patient has a contact number available for                            emergencies. The signs and symptoms of potential                            delayed complications were discussed with the                            patient. Return to normal activities tomorrow.                            Written discharge instructions were provided to the                            patient.                           - Resume previous diet.                           - Continue present medications.                           - Await pathology results.                           - No repeat colonoscopy or other colon cancer                            screening tests are necessary due to age.  L. Loletha Carrow, MD 09/01/2019 9:27:31 AM This report has been signed electronically.

## 2019-09-01 NOTE — Progress Notes (Signed)
Called to room to assist during endoscopic procedure.  Patient ID and intended procedure confirmed with present staff. Received instructions for my participation in the procedure from the performing physician.  

## 2019-09-01 NOTE — Progress Notes (Signed)
Pt's states no medical or surgical changes since previsit or office visit.  KA temps, SH IV and CW vitals.

## 2019-09-03 ENCOUNTER — Encounter: Payer: Self-pay | Admitting: Gastroenterology

## 2019-09-03 ENCOUNTER — Telehealth: Payer: Self-pay

## 2019-09-03 NOTE — Telephone Encounter (Signed)
  Follow up Call-  Call back number 09/01/2019  Post procedure Call Back phone  # [phone]  Permission to leave phone message Yes  Some recent data might be hidden     Patient questions:  Do you have a fever, pain , or abdominal swelling? No. Pain Score  0 *  Have you tolerated food without any problems? Yes.    Have you been able to return to your normal activities? Yes.    Do you have any questions about your discharge instructions: Diet   No. Medications  No. Follow up visit  No.  Do you have questions or concerns about your Care? No.  Actions: * If pain score is 4 or above: No action needed, pain <4.  1. Have you developed a fever since your procedure? No  2.   Have you had an respiratory symptoms (SOB or cough) since your procedure? No  3.   Have you tested positive for COVID 19 since your procedure No  4.   Have you had any family members/close contacts diagnosed with the COVID 19 since your procedure?  No  If yes to any of these questions please route to Joylene John, RN and Alphonsa Gin, RN.

## 2019-09-10 DIAGNOSIS — L218 Other seborrheic dermatitis: Secondary | ICD-10-CM | POA: Diagnosis not present

## 2019-09-10 DIAGNOSIS — Z85828 Personal history of other malignant neoplasm of skin: Secondary | ICD-10-CM | POA: Diagnosis not present

## 2019-09-10 DIAGNOSIS — L570 Actinic keratosis: Secondary | ICD-10-CM | POA: Diagnosis not present

## 2019-09-14 ENCOUNTER — Telehealth: Payer: Self-pay | Admitting: Gastroenterology

## 2019-09-14 NOTE — Telephone Encounter (Signed)
Spoke to the patient, read the patient the results letter of her colonoscopy. No questions by the conclusion of the phone call.

## 2019-09-16 DIAGNOSIS — Z124 Encounter for screening for malignant neoplasm of cervix: Secondary | ICD-10-CM | POA: Diagnosis not present

## 2019-09-16 DIAGNOSIS — Z6820 Body mass index (BMI) 20.0-20.9, adult: Secondary | ICD-10-CM | POA: Diagnosis not present

## 2019-09-16 DIAGNOSIS — Z1231 Encounter for screening mammogram for malignant neoplasm of breast: Secondary | ICD-10-CM | POA: Diagnosis not present

## 2019-10-01 ENCOUNTER — Ambulatory Visit (HOSPITAL_COMMUNITY)
Admission: RE | Admit: 2019-10-01 | Discharge: 2019-10-01 | Disposition: A | Discharge status: Home / Self Care | Payer: Medicare Other | Source: Ambulatory Visit | Attending: Internal Medicine | Admitting: Internal Medicine

## 2019-10-01 ENCOUNTER — Other Ambulatory Visit: Payer: Self-pay

## 2019-10-01 ENCOUNTER — Encounter (HOSPITAL_COMMUNITY): Payer: Self-pay

## 2019-10-01 DIAGNOSIS — M810 Age-related osteoporosis without current pathological fracture: Secondary | ICD-10-CM | POA: Diagnosis not present

## 2019-10-01 MED ORDER — DENOSUMAB 60 MG/ML ~~LOC~~ SOSY
60.0000 mg | PREFILLED_SYRINGE | Freq: Once | SUBCUTANEOUS | Status: AC
  Administered 2019-10-01: 60 mg via SUBCUTANEOUS
  Filled 2019-10-01: qty 1

## 2019-10-01 NOTE — Progress Notes (Signed)
Prolia 60 mg SQ given today, rt upper outer arm.  Patient will be due for Prolia again after Mar 30, 2020.  Patient made aware of this and informed to call Dr. Silvestre Mesi office in March 2021 to see if they want to continue the Prolia and also check to see if new labs are needed.  Pt voiced understanding.  Info on Prolia also given to patient.

## 2019-10-01 NOTE — Discharge Instructions (Signed)
You are due for the next Prolia injection after Mar 30, 2020.  Please make contact with Dr. Silvestre Mesi office [phone]) in March 2021 to see if they want you to continue the injection and to see if new lab work is needed.    Denosumab injection What is this medicine? DENOSUMAB (den oh sue mab) slows bone breakdown. Prolia is used to treat osteoporosis in women after menopause and in men, and in people who are taking corticosteroids for 6 months or more. Delton See is used to treat a high calcium level due to cancer and to prevent bone fractures and other bone problems caused by multiple myeloma or cancer bone metastases. Delton See is also used to treat giant cell tumor of the bone. This medicine may be used for other purposes; ask your health care provider or pharmacist if you have questions. COMMON BRAND NAME(S): Prolia, XGEVA What should I tell my health care provider before I take this medicine? They need to know if you have any of these conditions:  dental disease  having surgery or tooth extraction  infection  kidney disease  low levels of calcium or Vitamin D in the blood  malnutrition  on hemodialysis  skin conditions or sensitivity  thyroid or parathyroid disease  an unusual reaction to denosumab, other medicines, foods, dyes, or preservatives  pregnant or trying to get pregnant  breast-feeding How should I use this medicine? This medicine is for injection under the skin. It is given by a health care professional in a hospital or clinic setting. A special MedGuide will be given to you before each treatment. Be sure to read this information carefully each time. For Prolia, talk to your pediatrician regarding the use of this medicine in children. Special care may be needed. For Delton See, talk to your pediatrician regarding the use of this medicine in children. While this drug may be prescribed for children as young as 13 years for selected conditions, precautions do  apply. Overdosage: If you think you have taken too much of this medicine contact a poison control center or emergency room at once. NOTE: This medicine is only for you. Do not share this medicine with others. What if I miss a dose? It is important not to miss your dose. Call your doctor or health care professional if you are unable to keep an appointment. What may interact with this medicine? Do not take this medicine with any of the following medications:  other medicines containing denosumab This medicine may also interact with the following medications:  medicines that lower your chance of fighting infection  steroid medicines like prednisone or cortisone This list may not describe all possible interactions. Give your health care provider a list of all the medicines, herbs, non-prescription drugs, or dietary supplements you use. Also tell them if you smoke, drink alcohol, or use illegal drugs. Some items may interact with your medicine. What should I watch for while using this medicine? Visit your doctor or health care professional for regular checks on your progress. Your doctor or health care professional may order blood tests and other tests to see how you are doing. Call your doctor or health care professional for advice if you get a fever, chills or sore throat, or other symptoms of a cold or flu. Do not treat yourself. This drug may decrease your body's ability to fight infection. Try to avoid being around people who are sick. You should make sure you get enough calcium and vitamin D while you are taking this  medicine, unless your doctor tells you not to. Discuss the foods you eat and the vitamins you take with your health care professional. See your dentist regularly. Brush and floss your teeth as directed. Before you have any dental work done, tell your dentist you are receiving this medicine. Do not become pregnant while taking this medicine or for 5 months after stopping it. Talk  with your doctor or health care professional about your birth control options while taking this medicine. Women should inform their doctor if they wish to become pregnant or think they might be pregnant. There is a potential for serious side effects to an unborn child. Talk to your health care professional or pharmacist for more information. What side effects may I notice from receiving this medicine? Side effects that you should report to your doctor or health care professional as soon as possible:  allergic reactions like skin rash, itching or hives, swelling of the face, lips, or tongue  bone pain  breathing problems  dizziness  jaw pain, especially after dental work  redness, blistering, peeling of the skin  signs and symptoms of infection like fever or chills; cough; sore throat; pain or trouble passing urine  signs of low calcium like fast heartbeat, muscle cramps or muscle pain; pain, tingling, numbness in the hands or feet; seizures  unusual bleeding or bruising  unusually weak or tired Side effects that usually do not require medical attention (report to your doctor or health care professional if they continue or are bothersome):  constipation  diarrhea  headache  joint pain  loss of appetite  muscle pain  runny nose  tiredness  upset stomach This list may not describe all possible side effects. Call your doctor for medical advice about side effects. You may report side effects to FDA at 1-800-FDA-1088. Where should I keep my medicine? This medicine is only given in a clinic, doctor's office, or other health care setting and will not be stored at home. NOTE: This sheet is a summary. It may not cover all possible information. If you have questions about this medicine, talk to your doctor, pharmacist, or health care provider.  2020 Elsevier/Gold Standard (2018-03-06 16:10:44)

## 2019-10-21 ENCOUNTER — Other Ambulatory Visit: Payer: Self-pay | Admitting: Cardiovascular Disease

## 2019-10-21 MED ORDER — EZETIMIBE 10 MG PO TABS: 10.0000 mg | ORAL_TABLET | Freq: Every day | ORAL | 2 refills | Status: DC

## 2019-10-21 NOTE — Telephone Encounter (Signed)
Pt's medication was resent to pt's pharmacy as requested. Confirmation received.  °

## 2019-10-22 DIAGNOSIS — Z85828 Personal history of other malignant neoplasm of skin: Secondary | ICD-10-CM | POA: Diagnosis not present

## 2019-10-22 DIAGNOSIS — L570 Actinic keratosis: Secondary | ICD-10-CM | POA: Diagnosis not present

## 2019-12-02 ENCOUNTER — Ambulatory Visit (INDEPENDENT_AMBULATORY_CARE_PROVIDER_SITE_OTHER): Payer: Medicare Other | Admitting: Podiatry

## 2019-12-02 ENCOUNTER — Other Ambulatory Visit: Payer: Self-pay

## 2019-12-02 ENCOUNTER — Encounter: Payer: Self-pay | Admitting: Podiatry

## 2019-12-02 VITALS — Temp 97.3°F

## 2019-12-02 DIAGNOSIS — L84 Corns and callosities: Secondary | ICD-10-CM | POA: Diagnosis not present

## 2019-12-02 DIAGNOSIS — I999 Unspecified disorder of circulatory system: Secondary | ICD-10-CM

## 2019-12-02 DIAGNOSIS — I7300 Raynaud's syndrome without gangrene: Secondary | ICD-10-CM | POA: Diagnosis not present

## 2019-12-02 DIAGNOSIS — M2042 Other hammer toe(s) (acquired), left foot: Secondary | ICD-10-CM

## 2019-12-06 NOTE — Progress Notes (Signed)
Subjective:   Patient ID: [name], female   DOB: 86 y.o.   MRN: [id number]   HPI Patient presents stating that she has developed more problems with lesions on both feet and it seems worse in the wintertime.  She does know that she is got vascular disease and she tries to be careful with cold exposure   ROS      Objective:  Physical Exam  Neurovascular status intact negative Bevelyn Buckles' sign was noted with patient found to have discoloration of digits bilateral localized with no current breakdown of tissue with painful keratotic lesions plantar aspect both feet and digits with pain upon pressure to the area.     Assessment:  Patient who has moderate vascular disease in conjunction with probable Raynaud's exposed during winter to more cold temperature with lesion formation bilateral     Plan:  P reviewed condition and today I went ahead and I did debridement of lesions with no iatrogenic bleeding and education rendered concerning vascular disease concerning continued activity and also avoiding cold exposure.  Reappoint to recheck

## 2019-12-20 ENCOUNTER — Ambulatory Visit: Payer: Medicare Other

## 2020-01-03 ENCOUNTER — Other Ambulatory Visit: Payer: Self-pay | Admitting: Internal Medicine

## 2020-01-03 DIAGNOSIS — R1031 Right lower quadrant pain: Secondary | ICD-10-CM | POA: Insufficient documentation

## 2020-01-05 ENCOUNTER — Other Ambulatory Visit: Payer: Self-pay

## 2020-01-05 ENCOUNTER — Ambulatory Visit
Admission: RE | Admit: 2020-01-05 | Discharge: 2020-01-05 | Disposition: A | Discharge status: Home / Self Care | Payer: Medicare Other | Source: Ambulatory Visit | Attending: Internal Medicine | Admitting: Internal Medicine

## 2020-01-05 DIAGNOSIS — R1031 Right lower quadrant pain: Secondary | ICD-10-CM | POA: Diagnosis not present

## 2020-01-05 MED ORDER — IOPAMIDOL (ISOVUE-300) INJECTION 61%
100.0000 mL | Freq: Once | INTRAVENOUS | Status: AC | PRN
  Administered 2020-01-05: 100 mL via INTRAVENOUS

## 2020-01-17 ENCOUNTER — Ambulatory Visit (INDEPENDENT_AMBULATORY_CARE_PROVIDER_SITE_OTHER): Payer: Medicare Other | Admitting: Physician Assistant

## 2020-01-17 ENCOUNTER — Encounter: Payer: Self-pay | Admitting: Physician Assistant

## 2020-01-17 VITALS — BP 132/62 | HR 72 | Temp 97.9°F | Ht 59.0 in | Wt 108.5 lb

## 2020-01-17 DIAGNOSIS — H33303 Unspecified retinal break, bilateral: Secondary | ICD-10-CM | POA: Diagnosis not present

## 2020-01-17 DIAGNOSIS — R935 Abnormal findings on diagnostic imaging of other abdominal regions, including retroperitoneum: Secondary | ICD-10-CM

## 2020-01-17 DIAGNOSIS — H3589 Other specified retinal disorders: Secondary | ICD-10-CM | POA: Diagnosis not present

## 2020-01-17 DIAGNOSIS — H5213 Myopia, bilateral: Secondary | ICD-10-CM | POA: Diagnosis not present

## 2020-01-17 DIAGNOSIS — H52223 Regular astigmatism, bilateral: Secondary | ICD-10-CM | POA: Diagnosis not present

## 2020-01-17 DIAGNOSIS — H3540 Unspecified peripheral retinal degeneration: Secondary | ICD-10-CM | POA: Diagnosis not present

## 2020-01-17 DIAGNOSIS — Z961 Presence of intraocular lens: Secondary | ICD-10-CM | POA: Diagnosis not present

## 2020-01-17 DIAGNOSIS — H524 Presbyopia: Secondary | ICD-10-CM | POA: Diagnosis not present

## 2020-01-17 DIAGNOSIS — Z9849 Cataract extraction status, unspecified eye: Secondary | ICD-10-CM | POA: Diagnosis not present

## 2020-01-17 NOTE — Patient Instructions (Signed)
If you are age 84 or older, your body mass index should be between 23-30. Your Body mass index is 21.91 kg/m. If this is out of the aforementioned range listed, please consider follow up with your Primary Care Provider.  If you are age 49 or younger, your body mass index should be between 19-25. Your Body mass index is 21.91 kg/m. If this is out of the aformentioned range listed, please consider follow up with your Primary Care Provider.   You have been scheduled for an endoscopy. Please follow written instructions given to you at your visit today. If you use inhalers (even only as needed), please bring them with you on the day of your procedure.

## 2020-01-17 NOTE — Progress Notes (Signed)
Chief Complaint: Abnormal imaging of the abdomen and rectum  HPI:    [name] is a 86 year old Caucasian female with a past medical history as listed below, known to Dr. Rachael Darby, who was referred to me by Crist Infante, MD for a complaint of abnormal imaging of the abdomen and rectum.    March 2008 colonoscopy with Dr. Sharlett Iles for history of colon polyps.  No polyps on that exam.  Multiple hyperplastic polyps on January 2000 for colonoscopy.      06/02/2019 patient was seen via telemedicine by Dr. Loletha Carrow for positive Cologuard.  At that time patient also offered colonoscopy.  She later scheduled in October.    09/01/2019 colonoscopy with decreased sphincter tone, 150 mm polyp in the proximal ascending colon removed, one 6 mm polyp in the proximal ascending colon removed, 2 4-6 mm polyps in the proximal transverse colon removed, diverticulosis in the sigmoid colon and otherwise normal.     01/05/2020 CT abdomen pelvis with contrast done for right lower quadrant pain for 3 weeks and swelling at times with umbilical and bilateral inguinal hernias containing fat, no bowel herniation, question anorectal wall thickening and recommended correlation with digital rectal exam and proctoscopy to exclude tumor.  Also possible mass of the gastric wall within a small hiatal hernia versus focal gastritis or less likely artifact.  Recommended endoscopy to exclude neoplasm.  Emphysematous changes.    Today, the patient presents clinic and explains that she has been feeling fairly well.  She was having some intermittent right lower quadrant pain for which CT was ordered and correlates with finding of inguinal hernia in that region.  Tells me this only bothers her occasionally, sometimes when she is walking or lifting things.  Otherwise it does not bother her at all.    Social history positive for being the mother of 7 children and the daughter in a family with 7 children, her husband also had 7 children in his family.   He passed away 2 years ago.    Denies fever, chills, change in bowel habits, blood in her stool, nausea, vomiting, heartburn, reflux or abdominal pain.  Past Medical History:  Diagnosis Date  . Arthritis   . Cataract   . Hx of colonic polyps   . Hypercholesterolemia   . Lumbar back pain   . Neck pain   . Osteoporosis   . Swelling of ankle 01/12/16   bilateral. Pt is taking lasix x 2 weeks and wearing compression stockings    Past Surgical History:  Procedure Laterality Date  . ABDOMINAL HYSTERECTOMY  12/2003   DR. Neal--bso, A/P repair  . cataract surgery    . COLONOSCOPY    . LUMBAR LAMINECTOMY  1985   Dr. Gladstone Lighter  . POLYPECTOMY    . surgery for removal of rectal polyp  07/1995   Dr. Modena Morrow  . TUBAL LIGATION      Current Outpatient Medications  Medication Sig Dispense Refill  . Calcium Carbonate-Vitamin D (CALTRATE 600+D) 600-400 MG-UNIT per tablet Take 1 tablet by mouth 2 (two) times daily.     . cholecalciferol (VITAMIN D) 1000 UNITS tablet Take 2,000 Units by mouth daily.     . Cyanocobalamin (VITAMIN B 12 PO) Take 1 tablet by mouth daily.    Marland Kitchen ezetimibe (ZETIA) 10 MG tablet Take 1 tablet (10 mg total) by mouth daily. 90 tablet 2  . Pediatric Multivit-Minerals-C (ONE-A-DAY SCOOBY-DOO GUMMIES) CHEW Chew 1 tablet by mouth daily. Pt takes Caltrate    .  Probiotic Product (PROBIOTIC DAILY PO) Take 1 capsule by mouth daily.     Current Facility-Administered Medications  Medication Dose Route Frequency Provider Last Rate Last Admin  . triamcinolone acetonide (KENALOG) 10 MG/ML injection 10 mg  10 mg Other Once Wallene Huh, DPM        Allergies as of 01/17/2020 - Review Complete 12/02/2019  Allergen Reaction Noted  . Risedronate sodium Other (See Comments)   . Cephalexin Other (See Comments) 02/21/2017  . Hydrocodone Nausea Only     Family History  Problem Relation Age of Onset  . Stroke Mother   . Heart Problems Sister        triple bipass  . Heart Problems  Brother        blocked arteries, stent.  . Stomach cancer Neg Hx   . Colon cancer Neg Hx   . Pancreatic cancer Neg Hx   . Rectal cancer Neg Hx   . Colon polyps Neg Hx   . Esophageal cancer Neg Hx     Social History   Socioeconomic History  . Marital status: Widowed    Spouse name: Not on file  . Number of children: 7  . Years of education: Not on file  . Highest education level: Not on file  Occupational History  . Occupation: retired  Tobacco Use  . Smoking status: Former Smoker    Packs/day: 0.25    Years: 1.00    Pack years: 0.25    Types: Cigarettes    Quit date: 11/11/1954    Years since quitting: 65.2  . Smokeless tobacco: Never Used  Substance and Sexual Activity  . Alcohol use: Yes    Alcohol/week: 7.0 standard drinks    Types: 7 Standard drinks or equivalent per week    Comment: wine with dinner  . Drug use: No  . Sexual activity: Never    Birth control/protection: Abstinence  Other Topics Concern  . Not on file  Social History Narrative  . Not on file   Social Determinants of Health   Financial Resource Strain:   . Difficulty of Paying Living Expenses: Not on file  Food Insecurity:   . Worried About Charity fundraiser in the Last Year: Not on file  . Ran Out of Food in the Last Year: Not on file  Transportation Needs:   . Lack of Transportation (Medical): Not on file  . Lack of Transportation (Non-Medical): Not on file  Physical Activity:   . Days of Exercise per Week: Not on file  . Minutes of Exercise per Session: Not on file  Stress:   . Feeling of Stress : Not on file  Social Connections:   . Frequency of Communication with Friends and Family: Not on file  . Frequency of Social Gatherings with Friends and Family: Not on file  . Attends Religious Services: Not on file  . Active Member of Clubs or Organizations: Not on file  . Attends Archivist Meetings: Not on file  . Marital Status: Not on file  Intimate Partner Violence:   .  Fear of Current or Ex-Partner: Not on file  . Emotionally Abused: Not on file  . Physically Abused: Not on file  . Sexually Abused: Not on file    Review of Systems:    Constitutional: No weight loss, fever or chills Cardiovascular: No chest pain Respiratory: No SOB  Gastrointestinal: See HPI and otherwise negative   Physical Exam:  Vital signs: BP 132/62 (BP Location: Right  Arm, Patient Position: Sitting, Cuff Size: Normal)   Pulse 72   Temp 97.9 F (36.6 C)   Ht 4\' 11"  (1.499 m)   Wt 108 lb 8 oz (49.2 kg)   SpO2 100%   BMI 21.91 kg/m   Constitutional:   Pleasant elderly Caucasian female appears to be in NAD, Well developed, Well nourished, alert and cooperative Respiratory: Respirations even and unlabored. Lungs clear to auscultation bilaterally.   No wheezes, crackles, or rhonchi.  Cardiovascular: Normal S1, S2. No MRG. Regular rate and rhythm. No peripheral edema, cyanosis or pallor.  Gastrointestinal:  Soft, nondistended, nontender. No rebound or guarding. Normal bowel sounds. No appreciable masses or hepatomegaly. Rectal: External: No hemorrhoids or tags; internal: No mass; anoscopy: Grade 1 internal hemorrhoids and otherwise normal Psychiatric: Demonstrates good judgement and reason without abnormal affect or behaviors.  See HPI for recent imaging.  Assessment: 1.  Abnormal CT scan: Showing rectal thinking as well as stomach thickening, recent colonoscopy in November 2020 with no abnormalities in the rectum, and anoscopy normal today-most likely this correlates with underinflation of the rectum at time of exam, will need to evaluate stomach thickening a little further; consider normal variant versus stomach cancer  Plan: 1.  Performed anoscopy today.  This is completely normal.  This is reassuring for the patient and myself.  Patient did just have a colonoscopy November 2020.  Due to this and normal anoscopy today no further evaluation needed for abnormality on recent CT  scan. 2.  Did schedule the patient for an EGD with Dr. Loletha Carrow in the [facility] given abnormal CT scan.  Did discuss risks, benefits, limitations and alternatives and the patient agrees to proceed.  Patient had her second Covid vaccine 2 weeks ago.  She will not need to be Covid tested prior to time of exam. 3.  Patient to follow in clinic per recommendations after EGD as above.  Ellouise Newer, PA-C [facility] 01/17/2020, 3:05 PM  Cc: Crist Infante, MD

## 2020-01-17 NOTE — Progress Notes (Signed)
____________________________________________________________  Attending physician addendum:  Thank you for sending this case to me. I have reviewed the entire note, and the outlined plan seems appropriate.  Agree does not need another lower endoscopic exam. EGD reasonable - I reviewed CT scan and suspect the gastric finding more likely result of hiatal hernia than severe esophagitis or malignancy.  Wilfrid Lund, MD  ____________________________________________________________

## 2020-01-27 DIAGNOSIS — Z85828 Personal history of other malignant neoplasm of skin: Secondary | ICD-10-CM | POA: Diagnosis not present

## 2020-01-27 DIAGNOSIS — D225 Melanocytic nevi of trunk: Secondary | ICD-10-CM | POA: Diagnosis not present

## 2020-01-27 DIAGNOSIS — L821 Other seborrheic keratosis: Secondary | ICD-10-CM | POA: Diagnosis not present

## 2020-01-27 DIAGNOSIS — L720 Epidermal cyst: Secondary | ICD-10-CM | POA: Diagnosis not present

## 2020-02-01 ENCOUNTER — Ambulatory Visit (AMBULATORY_SURGERY_CENTER): Payer: Medicare Other | Admitting: Gastroenterology

## 2020-02-01 ENCOUNTER — Encounter: Payer: Self-pay | Admitting: Gastroenterology

## 2020-02-01 ENCOUNTER — Other Ambulatory Visit: Payer: Self-pay

## 2020-02-01 VITALS — BP 109/66 | HR 84 | Temp 98.2°F | Resp 18 | Ht 59.0 in | Wt 108.0 lb

## 2020-02-01 DIAGNOSIS — K449 Diaphragmatic hernia without obstruction or gangrene: Secondary | ICD-10-CM

## 2020-02-01 DIAGNOSIS — R933 Abnormal findings on diagnostic imaging of other parts of digestive tract: Secondary | ICD-10-CM | POA: Diagnosis not present

## 2020-02-01 DIAGNOSIS — R935 Abnormal findings on diagnostic imaging of other abdominal regions, including retroperitoneum: Secondary | ICD-10-CM | POA: Diagnosis not present

## 2020-02-01 MED ORDER — SODIUM CHLORIDE 0.9 % IV SOLN: 500.0000 mL | Freq: Once | INTRAVENOUS | Status: DC

## 2020-02-01 NOTE — Progress Notes (Signed)
Vs by DT adm  Temp JB-front desk

## 2020-02-01 NOTE — Patient Instructions (Signed)
Please read handouts provided. Continue present medications.      YOU HAD AN ENDOSCOPIC PROCEDURE TODAY AT THE [facility]:   Refer to the procedure report that was given to you for any specific questions about what was found during the examination.  If the procedure report does not answer your questions, please call your gastroenterologist to clarify.  If you requested that your care partner not be given the details of your procedure findings, then the procedure report has been included in a sealed envelope for you to review at your convenience later.  YOU SHOULD EXPECT: Some feelings of bloating in the abdomen. Passage of more gas than usual.  Walking can help get rid of the air that was put into your GI tract during the procedure and reduce the bloating. If you had a lower endoscopy (such as a colonoscopy or flexible sigmoidoscopy) you may notice spotting of blood in your stool or on the toilet paper. If you underwent a bowel prep for your procedure, you may not have a normal bowel movement for a few days.  Please Note:  You might notice some irritation and congestion in your nose or some drainage.  This is from the oxygen used during your procedure.  There is no need for concern and it should clear up in a day or so.  SYMPTOMS TO REPORT IMMEDIATELY:    Following upper endoscopy (EGD)  Vomiting of blood or coffee ground material  New chest pain or pain under the shoulder blades  Painful or persistently difficult swallowing  New shortness of breath  Fever of 100F or higher  Black, tarry-looking stools  For urgent or emergent issues, a gastroenterologist can be reached at any hour by calling [phone]. Do not use MyChart messaging for urgent concerns.    DIET:  We do recommend a small meal at first, but then you may proceed to your regular diet.  Drink plenty of fluids but you should avoid alcoholic beverages for 24 hours.  ACTIVITY:  You should plan to take it easy  for the rest of today and you should NOT DRIVE or use heavy machinery until tomorrow (because of the sedation medicines used during the test).    FOLLOW UP: Our staff will call the number listed on your records 48-72 hours following your procedure to check on you and address any questions or concerns that you may have regarding the information given to you following your procedure. If we do not reach you, we will leave a message.  We will attempt to reach you two times.  During this call, we will ask if you have developed any symptoms of COVID 19. If you develop any symptoms (ie: fever, flu-like symptoms, shortness of breath, cough etc.) before then, please call [phone].  If you test positive for Covid 19 in the 2 weeks post procedure, please call and report this information to us.    If any biopsies were taken you will be contacted by phone or by letter within the next 1-3 weeks.  Please call us at [phone] if you have not heard about the biopsies in 3 weeks.    SIGNATURES/CONFIDENTIALITY: You and/or your care partner have signed paperwork which will be entered into your electronic medical record.  These signatures attest to the fact that that the information above on your After Visit Summary has been reviewed and is understood.  Full responsibility of the confidentiality of this discharge information lies with you and/or your care-partner. 

## 2020-02-01 NOTE — Progress Notes (Signed)
To pacu, VSS. Report to [email or web address] 

## 2020-02-01 NOTE — Op Note (Signed)
[place] Patient Name: [name] Procedure Date: 02/01/2020 7:34 AM MRN: [id number] Endoscopist: Mallie Mussel L. Loletha Carrow , MD Age: 86 Referring MD:  Date of Birth: 1932-12-27 Gender: Female Account #: [id number] Procedure:                Upper GI endoscopy Indications:              Abnormal CT of the GI tract (CTAP suggesting                            thickening gastric cardia within hiatal hernia) Medicines:                Monitored Anesthesia Care Procedure:                Pre-Anesthesia Assessment:                           - Prior to the procedure, a History and Physical                            was performed, and patient medications and                            allergies were reviewed. The patient's tolerance of                            previous anesthesia was also reviewed. The risks                            and benefits of the procedure and the sedation                            options and risks were discussed with the patient.                            All questions were answered, and informed consent                            was obtained. Prior Anticoagulants: The patient has                            taken no previous anticoagulant or antiplatelet                            agents. ASA Grade Assessment: II - A patient with                            mild systemic disease. After reviewing the risks                            and benefits, the patient was deemed in                            satisfactory condition to undergo the procedure.  After obtaining informed consent, the endoscope was                            passed under direct vision. Throughout the                            procedure, the patient's blood pressure, pulse, and                            oxygen saturations were monitored continuously. The                            Endoscope was introduced through the mouth, and                            advanced to  the second part of duodenum. The upper                            GI endoscopy was accomplished without difficulty.                            The patient tolerated the procedure well. Scope In: Scope Out: Findings:                 A small hiatal hernia was present.                           The exam of the esophagus was otherwise normal.                           The stomach was normal.                           The cardia and gastric fundus were normal on                            retroflexion.                           The examined duodenum was normal. Complications:            No immediate complications. Estimated Blood Loss:     Estimated blood loss: none. Impression:               - Small hiatal hernia. Cause of CT appearance. No                            pathology found.                           - Normal stomach.                           - Normal examined duodenum.                           - No specimens collected. Recommendation:           -  Patient has a contact number available for                            emergencies. The signs and symptoms of potential                            delayed complications were discussed with the                            patient. Return to normal activities tomorrow.                            Written discharge instructions were provided to the                            patient.                           - Resume previous diet.                           - Continue present medications. Lowella Kindley L. Loletha Carrow, MD 02/01/2020 8:16:04 AM This report has been signed electronically.

## 2020-02-01 NOTE — Progress Notes (Signed)
Pt's states no medical or surgical changes since previsit or office visit. 

## 2020-02-03 ENCOUNTER — Telehealth: Payer: Self-pay

## 2020-02-03 NOTE — Telephone Encounter (Signed)
  Follow up Call-  Call back number 02/01/2020 09/01/2019  Post procedure Call Back phone  # [phone]  Permission to leave phone message Yes Yes  Some recent data might be hidden     Patient questions:  Do you have a fever, pain , or abdominal swelling? No. Pain Score  0 *  Have you tolerated food without any problems? Yes.    Have you been able to return to your normal activities? Yes.    Do you have any questions about your discharge instructions: Diet   No. Medications  No. Follow up visit  No.  Do you have questions or concerns about your Care? No.  Actions: * If pain score is 4 or above: No action needed, pain <4.  Pt c/o "bump" on the roof of her mouth, but stated it was getting better today.  Didn't stop her from eating and drinking.  1. Have you developed a fever since your procedure? no  2.   Have you had an respiratory symptoms (SOB or cough) since your procedure? no  3.   Have you tested positive for COVID 19 since your procedure no  4.   Have you had any family members/close contacts diagnosed with the COVID 19 since your procedure?  no   If yes to any of these questions please route to Joylene John, RN and Erenest Rasher, RN

## 2020-03-03 ENCOUNTER — Other Ambulatory Visit: Payer: Self-pay

## 2020-03-03 ENCOUNTER — Encounter: Payer: Self-pay | Admitting: Podiatry

## 2020-03-03 ENCOUNTER — Ambulatory Visit (INDEPENDENT_AMBULATORY_CARE_PROVIDER_SITE_OTHER): Payer: Medicare Other | Admitting: Podiatry

## 2020-03-03 DIAGNOSIS — L84 Corns and callosities: Secondary | ICD-10-CM

## 2020-03-03 DIAGNOSIS — M2042 Other hammer toe(s) (acquired), left foot: Secondary | ICD-10-CM

## 2020-03-06 NOTE — Progress Notes (Signed)
Subjective:   Patient ID: [name], female   DOB: 86 y.o.   MRN: [id number]   HPI Should presents stating that she has corns on both second toes and the left big toe can hurt to bend.  States it is becoming more painful over time and she is concerned about wearing shoe gear comfortably   ROS      Objective:  Physical Exam  Neurovascular status intact negative Bevelyn Buckles' sign was noted with patient's digits shown to have deformity of the second and third toes both feet with keratotic lesions present and also the left big toe has reduced range of motion with inflammation     Assessment:  Several problems with 1 being chronic corn callus formation secondarily hammertoe deformity and hallux limitus condition     Plan:  H&P discussed all conditions and different treatments and shoe gear modifications.  Today I debrided the lesions on both feet with no iatrogenic bleeding advised on padding therapy wider shoes and discussed digital correction with straightening procedures and possible hallux limitus repair left.  We will continue to watch this carefully and patient will be seen back to recheck

## 2020-04-14 DIAGNOSIS — E7849 Other hyperlipidemia: Secondary | ICD-10-CM | POA: Diagnosis not present

## 2020-04-14 DIAGNOSIS — M810 Age-related osteoporosis without current pathological fracture: Secondary | ICD-10-CM | POA: Diagnosis not present

## 2020-04-21 DIAGNOSIS — R600 Localized edema: Secondary | ICD-10-CM | POA: Diagnosis not present

## 2020-04-21 DIAGNOSIS — M7989 Other specified soft tissue disorders: Secondary | ICD-10-CM | POA: Diagnosis not present

## 2020-04-21 DIAGNOSIS — Z0000 Encounter for general adult medical examination without abnormal findings: Secondary | ICD-10-CM | POA: Diagnosis not present

## 2020-04-21 DIAGNOSIS — R82998 Other abnormal findings in urine: Secondary | ICD-10-CM | POA: Diagnosis not present

## 2020-04-21 DIAGNOSIS — D7589 Other specified diseases of blood and blood-forming organs: Secondary | ICD-10-CM | POA: Diagnosis not present

## 2020-04-21 DIAGNOSIS — S81809A Unspecified open wound, unspecified lower leg, initial encounter: Secondary | ICD-10-CM | POA: Diagnosis not present

## 2020-04-21 DIAGNOSIS — M810 Age-related osteoporosis without current pathological fracture: Secondary | ICD-10-CM | POA: Diagnosis not present

## 2020-04-21 DIAGNOSIS — J309 Allergic rhinitis, unspecified: Secondary | ICD-10-CM | POA: Diagnosis not present

## 2020-04-21 DIAGNOSIS — K4090 Unilateral inguinal hernia, without obstruction or gangrene, not specified as recurrent: Secondary | ICD-10-CM | POA: Insufficient documentation

## 2020-04-21 DIAGNOSIS — I872 Venous insufficiency (chronic) (peripheral): Secondary | ICD-10-CM | POA: Diagnosis not present

## 2020-04-21 DIAGNOSIS — Z780 Asymptomatic menopausal state: Secondary | ICD-10-CM | POA: Diagnosis not present

## 2020-04-21 DIAGNOSIS — E785 Hyperlipidemia, unspecified: Secondary | ICD-10-CM | POA: Diagnosis not present

## 2020-07-26 ENCOUNTER — Other Ambulatory Visit: Payer: Self-pay

## 2020-07-26 MED ORDER — EZETIMIBE 10 MG PO TABS: 10.0000 mg | ORAL_TABLET | Freq: Every day | ORAL | 0 refills | Status: DC

## 2020-07-28 DIAGNOSIS — Z23 Encounter for immunization: Secondary | ICD-10-CM | POA: Diagnosis not present

## 2020-09-11 DIAGNOSIS — Z23 Encounter for immunization: Secondary | ICD-10-CM | POA: Diagnosis not present

## 2020-09-19 DIAGNOSIS — Z1231 Encounter for screening mammogram for malignant neoplasm of breast: Secondary | ICD-10-CM | POA: Diagnosis not present

## 2020-09-25 ENCOUNTER — Other Ambulatory Visit: Payer: Self-pay

## 2020-09-25 ENCOUNTER — Ambulatory Visit (INDEPENDENT_AMBULATORY_CARE_PROVIDER_SITE_OTHER): Payer: Medicare Other | Admitting: Cardiovascular Disease

## 2020-09-25 ENCOUNTER — Encounter: Payer: Self-pay | Admitting: Cardiovascular Disease

## 2020-09-25 VITALS — BP 124/72 | HR 57 | Ht 59.0 in | Wt 102.0 lb

## 2020-09-25 DIAGNOSIS — R011 Cardiac murmur, unspecified: Secondary | ICD-10-CM | POA: Diagnosis not present

## 2020-09-25 DIAGNOSIS — I890 Lymphedema, not elsewhere classified: Secondary | ICD-10-CM

## 2020-09-25 DIAGNOSIS — E782 Mixed hyperlipidemia: Secondary | ICD-10-CM

## 2020-09-25 MED ORDER — EZETIMIBE 10 MG PO TABS: 10.0000 mg | ORAL_TABLET | Freq: Every day | ORAL | 3 refills | Status: DC

## 2020-09-25 NOTE — Patient Instructions (Signed)
Medication Instructions:  Your provider recommends that you continue on your current medications as directed. Please refer to the Current Medication list given to you today.   *If you need a refill on your cardiac medications before your next appointment, please call your pharmacy*  Testing/Procedures: Your provider has requested that you have an echocardiogram. Echocardiography is a painless test that uses sound waves to create images of your heart. It provides your doctor with information about the size and shape of your heart and how well your heart's chambers and valves are working. This procedure takes approximately one hour. There are no restrictions for this procedure.      Follow-Up: At [facility], you and your health needs are our priority.  As part of our continuing mission to provide you with exceptional heart care, we have created designated Provider Care Teams.  These Care Teams include your primary Cardiologist (physician) and Advanced Practice Providers (APPs -  Physician Assistants and Nurse Practitioners) who all work together to provide you with the care you need, when you need it. Your next appointment:   12 month(s) The format for your next appointment:   In Person Provider:   You may see Michael Cooper, MD or one of the following Advanced Practice Providers on your designated Care Team:    Scott Weaver, PA-C  Vin Bhagat, PA-C   

## 2020-09-25 NOTE — Progress Notes (Signed)
Cardiology Office Note:    Date:  09/25/2020   ID:  ANAHLIA [name], DOB [birth year], MRN [id number]  PCP:  Crist Infante, MD  [facility] Cardiologist:  Sherren Mocha, MD  [place] Electrophysiologist:  None   Referring MD: Crist Infante, MD   Chief Complaint  Patient presents with  . Hyperlipidemia    History of Present Illness:    [name] is a 86 y.o. female with a hx of hyperlipidemia, presenting for follow-up evaluation.  She has not been treated with any statin medication due to reluctance to take this.  She used to take niacin but this was discontinued because of flushing. She was started on Zetia a few years back and has tolerated this well without side effects.  She continues to do well from a symptomatic perspective.  States that she walks up to 5 miles most days without exertional symptoms.  She specifically denies chest pain, chest pressure, or shortness of breath.  She has chronic left leg swelling related to lymphedema.  Otherwise no complaints.  Past Medical History:  Diagnosis Date  . Arthritis   . Cataract   . Hx of colonic polyps   . Hypercholesterolemia   . Lumbar back pain   . Neck pain   . Osteoporosis   . Swelling of ankle 01/12/16   bilateral. Pt is taking lasix x 2 weeks and wearing compression stockings    Past Surgical History:  Procedure Laterality Date  . ABDOMINAL HYSTERECTOMY  12/2003   DR. Neal--bso, A/P repair  . cataract surgery    . COLONOSCOPY    . LUMBAR LAMINECTOMY  1985   Dr. Gladstone Lighter  . POLYPECTOMY    . surgery for removal of rectal polyp  07/1995   Dr. Modena Morrow  . TUBAL LIGATION      Current Medications: Current Meds  Medication Sig  . Calcium Carbonate-Vitamin D (CALTRATE 600+D) 600-400 MG-UNIT per tablet Take 1 tablet by mouth 2 (two) times daily.   . cholecalciferol (VITAMIN D) 1000 UNITS tablet Take 2,000 Units by mouth daily.   . Cyanocobalamin (VITAMIN B 12 PO) Take 1 tablet by mouth daily.  Marland Kitchen ezetimibe  (ZETIA) 10 MG tablet Take 1 tablet (10 mg total) by mouth daily. you  . Pediatric Multivit-Minerals-C (ONE-A-DAY SCOOBY-DOO GUMMIES) CHEW Chew 1 tablet by mouth daily. Pt takes Caltrate  . Probiotic Product (PROBIOTIC DAILY PO) Take 1 capsule by mouth daily.  . [DISCONTINUED] ezetimibe (ZETIA) 10 MG tablet Take 1 tablet (10 mg total) by mouth daily. Please keep upcoming appt in November with Dr. Burt Knack before anymore refills. Thank you   Current Facility-Administered Medications for the 09/25/20 encounter (Office Visit) with Sherren Mocha, MD  Medication  . triamcinolone acetonide (KENALOG) 10 MG/ML injection 10 mg     Allergies:   Risedronate sodium, Cephalexin, and Hydrocodone   Social History   Socioeconomic History  . Marital status: Widowed    Spouse name: Not on file  . Number of children: 7  . Years of education: Not on file  . Highest education level: Not on file  Occupational History  . Occupation: retired  Tobacco Use  . Smoking status: Former Smoker    Packs/day: 0.25    Years: 1.00    Pack years: 0.25    Types: Cigarettes    Quit date: 11/11/1954    Years since quitting: 65.9  . Smokeless tobacco: Never Used  Vaping Use  . Vaping Use: Never used  Substance and Sexual  Activity  . Alcohol use: Yes    Alcohol/week: 7.0 standard drinks    Types: 7 Standard drinks or equivalent per week    Comment: wine with dinner  . Drug use: No  . Sexual activity: Never    Birth control/protection: Abstinence  Other Topics Concern  . Not on file  Social History Narrative  . Not on file   Social Determinants of Health   Financial Resource Strain:   . Difficulty of Paying Living Expenses: Not on file  Food Insecurity:   . Worried About Charity fundraiser in the Last Year: Not on file  . Ran Out of Food in the Last Year: Not on file  Transportation Needs:   . Lack of Transportation (Medical): Not on file  . Lack of Transportation (Non-Medical): Not on file  Physical  Activity:   . Days of Exercise per Week: Not on file  . Minutes of Exercise per Session: Not on file  Stress:   . Feeling of Stress : Not on file  Social Connections:   . Frequency of Communication with Friends and Family: Not on file  . Frequency of Social Gatherings with Friends and Family: Not on file  . Attends Religious Services: Not on file  . Active Member of Clubs or Organizations: Not on file  . Attends Archivist Meetings: Not on file  . Marital Status: Not on file     Family History: The patient's family history includes Heart Problems in her brother and sister; Stroke in her mother. There is no history of Stomach cancer, Colon cancer, Pancreatic cancer, Rectal cancer, Colon polyps, or Esophageal cancer.  ROS:   Please see the history of present illness.    All other systems reviewed and are negative.  EKGs/Labs/Other Studies Reviewed:    KG:  EKG is  ordered today.  The ekg ordered today demonstrates normal sinus rhythm 58 bpm, occasional PVC, otherwise normal  Recent Labs: No results found for requested labs within last 8760 hours.  Recent Lipid Panel    Component Value Date/Time   CHOL 182 09/28/2018 0729   TRIG 53 09/28/2018 0729   HDL 63 09/28/2018 0729   CHOLHDL 2.9 09/28/2018 0729   CHOLHDL 2.3 04/19/2016 0736   VLDL 10 04/19/2016 0736   LDLCALC 108 (H) 09/28/2018 0729   LDLDIRECT 158.8 11/14/2011 0733     Risk Assessment/Calculations:       Physical Exam:    VS:  BP 124/72   Pulse (!) 57   Ht 4\' 11"  (1.499 m)   Wt 102 lb (46.3 kg)   SpO2 99%   BMI 20.60 kg/m     Wt Readings from Last 3 Encounters:  09/25/20 102 lb (46.3 kg)  02/01/20 108 lb (49 kg)  01/17/20 108 lb 8 oz (49.2 kg)     GEN:  Well nourished, well developed in no acute distress HEENT: Normal NECK: No JVD; No carotid bruits LYMPHATICS: No lymphadenopathy CARDIAC: RRR, 2/6 systolic murmur best appreciated at the left lower sternal border RESPIRATORY:  Clear to  auscultation without rales, wheezing or rhonchi  ABDOMEN: Soft, non-tender, non-distended MUSCULOSKELETAL: 1+ left leg edema; No deformity  SKIN: Warm and dry NEUROLOGIC:  Alert and oriented x 3 PSYCHIATRIC:  Normal affect   ASSESSMENT:    1. Mixed hyperlipidemia   2. Lymphedema   3. Murmur, cardiac    PLAN:    In order of problems listed above:  1. Tolerating Zetia.  LDL cholesterol 114,  HDL 54.  Follows a healthy lifestyle. 2. Unchanged, uses compression as tolerated 3. No murmur appreciated.  No cardiovascular symptoms.  I have recommended an echocardiogram.    Shared Decision Making/Informed Consent        Medication Adjustments/Labs and Tests Ordered: Current medicines are reviewed at length with the patient today.  Concerns regarding medicines are outlined above.  Orders Placed This Encounter  Procedures  . EKG 12-Lead  . ECHOCARDIOGRAM COMPLETE   Meds ordered this encounter  Medications  . ezetimibe (ZETIA) 10 MG tablet    Sig: Take 1 tablet (10 mg total) by mouth daily. you    Dispense:  90 tablet    Refill:  3    Patient Instructions  Medication Instructions:  Your provider recommends that you continue on your current medications as directed. Please refer to the Current Medication list given to you today.   *If you need a refill on your cardiac medications before your next appointment, please call your pharmacy*  Testing/Procedures: Your provider has requested that you have an echocardiogram. Echocardiography is a painless test that uses sound waves to create images of your heart. It provides your doctor with information about the size and shape of your heart and how well your heart's chambers and valves are working. This procedure takes approximately one hour. There are no restrictions for this procedure.  Follow-Up: At [facility], you and your health needs are our priority.  As part of our continuing mission to provide you with exceptional heart care,  we have created designated Provider Care Teams.  These Care Teams include your primary Cardiologist (physician) and Advanced Practice Providers (APPs -  Physician Assistants and Nurse Practitioners) who all work together to provide you with the care you need, when you need it. Your next appointment:   12 month(s) The format for your next appointment:   In Person Provider:   You may see Sherren Mocha, MD or one of the following Advanced Practice Providers on your designated Care Team:    Richardson Dopp, PA-C  Robbie Lis, [place]      Signed, Sherren Mocha, MD  09/25/2020 1:16 PM    [place]

## 2020-09-27 DIAGNOSIS — Z01419 Encounter for gynecological examination (general) (routine) without abnormal findings: Secondary | ICD-10-CM | POA: Diagnosis not present

## 2020-09-27 DIAGNOSIS — Z6821 Body mass index (BMI) 21.0-21.9, adult: Secondary | ICD-10-CM | POA: Diagnosis not present

## 2020-10-02 ENCOUNTER — Other Ambulatory Visit: Payer: Self-pay

## 2020-10-02 MED ORDER — EZETIMIBE 10 MG PO TABS: 10.0000 mg | ORAL_TABLET | Freq: Every day | ORAL | 3 refills | Status: DC

## 2020-10-16 DIAGNOSIS — L853 Xerosis cutis: Secondary | ICD-10-CM | POA: Diagnosis not present

## 2020-10-16 DIAGNOSIS — Z85828 Personal history of other malignant neoplasm of skin: Secondary | ICD-10-CM | POA: Diagnosis not present

## 2020-10-16 DIAGNOSIS — L820 Inflamed seborrheic keratosis: Secondary | ICD-10-CM | POA: Diagnosis not present

## 2020-10-16 DIAGNOSIS — L570 Actinic keratosis: Secondary | ICD-10-CM | POA: Diagnosis not present

## 2020-10-16 DIAGNOSIS — L821 Other seborrheic keratosis: Secondary | ICD-10-CM | POA: Diagnosis not present

## 2020-10-19 ENCOUNTER — Other Ambulatory Visit (HOSPITAL_COMMUNITY): Payer: Medicare Other

## 2020-10-30 ENCOUNTER — Encounter: Payer: Self-pay | Admitting: Podiatry

## 2020-10-30 ENCOUNTER — Other Ambulatory Visit: Payer: Self-pay

## 2020-10-30 ENCOUNTER — Ambulatory Visit (INDEPENDENT_AMBULATORY_CARE_PROVIDER_SITE_OTHER): Payer: Medicare Other | Admitting: Podiatry

## 2020-10-30 DIAGNOSIS — M779 Enthesopathy, unspecified: Secondary | ICD-10-CM

## 2020-10-30 DIAGNOSIS — M21619 Bunion of unspecified foot: Secondary | ICD-10-CM

## 2020-10-30 DIAGNOSIS — M2042 Other hammer toe(s) (acquired), left foot: Secondary | ICD-10-CM

## 2020-10-30 MED ORDER — TRIAMCINOLONE ACETONIDE 10 MG/ML IJ SUSP
10.0000 mg | Freq: Once | INTRAMUSCULAR | Status: AC
  Administered 2020-10-30: 10 mg

## 2020-10-30 NOTE — Progress Notes (Signed)
Subjective:   Patient ID: [name], female   DOB: 86 y.o.   MRN: [id number]   HPI Patient presents stating she has a lot of pain in the second toe of her right foot she knows that she has bunions and hammertoes in both feet get sore but this 1 is the most sore   ROS      Objective:  Physical Exam  Neurovascular status intact with patient had found to have structural deformity with digital deformity and pressure of the big toe against the second toe with inflammation of the inner side second digit right with lesion formation     Assessment:  Inflammatory capsulitis inner phalangeal joint digit to right with keratotic lesion formation     Plan:  H&P conditions reviewed today I did sterile prep injected the inner phalangeal joint 1.5 mg dexamethasone Kenalog discussed bunions and hammertoes and discussed possible surgical intervention but we want to try to avoid that as best we can and patient will wear wide shoes with mesh.  Reappoint as needed

## 2020-11-01 ENCOUNTER — Ambulatory Visit (HOSPITAL_COMMUNITY): Payer: Medicare Other | Attending: Cardiology

## 2020-11-01 ENCOUNTER — Other Ambulatory Visit: Payer: Self-pay

## 2020-11-01 DIAGNOSIS — R011 Cardiac murmur, unspecified: Secondary | ICD-10-CM | POA: Insufficient documentation

## 2020-11-01 LAB — ECHOCARDIOGRAM COMPLETE
AR max vel: 1.37 cm2
AV Area VTI: 1.3 cm2
AV Area mean vel: 1.26 cm2
AV Mean grad: 10 mmHg
AV Peak grad: 19 mmHg
Ao pk vel: 2.18 m/s
Area-P 1/2: 2.33 cm2
P 1/2 time: 466 msec
S' Lateral: 2.5 cm

## 2020-11-07 ENCOUNTER — Telehealth (HOSPITAL_COMMUNITY): Payer: Self-pay | Admitting: Radiology

## 2020-11-07 NOTE — Telephone Encounter (Signed)
Patient called echo department expressing concerns regarding results. Please call patient.

## 2020-11-09 ENCOUNTER — Telehealth: Payer: Self-pay | Admitting: Cardiovascular Disease

## 2020-11-09 NOTE — Telephone Encounter (Signed)
Informed pt of results. Pt verbalized understanding. 

## 2020-11-09 NOTE — Telephone Encounter (Signed)
Left message to call back  

## 2020-11-09 NOTE — Telephone Encounter (Signed)
Patient would like a Nurse to go over Echo results with her. Please call her cell phone

## 2020-11-14 NOTE — Telephone Encounter (Signed)
Julio Sicks, RN  11/09/2020 10:54 AM EST      Informed pt of results. Pt verbalized understanding.

## 2021-01-23 DIAGNOSIS — Z9849 Cataract extraction status, unspecified eye: Secondary | ICD-10-CM | POA: Diagnosis not present

## 2021-01-23 DIAGNOSIS — Z961 Presence of intraocular lens: Secondary | ICD-10-CM | POA: Diagnosis not present

## 2021-01-23 DIAGNOSIS — H3540 Unspecified peripheral retinal degeneration: Secondary | ICD-10-CM | POA: Diagnosis not present

## 2021-01-23 DIAGNOSIS — H5213 Myopia, bilateral: Secondary | ICD-10-CM | POA: Diagnosis not present

## 2021-01-23 DIAGNOSIS — H52223 Regular astigmatism, bilateral: Secondary | ICD-10-CM | POA: Diagnosis not present

## 2021-01-27 ENCOUNTER — Emergency Department (HOSPITAL_COMMUNITY): Payer: Medicare Other

## 2021-01-27 ENCOUNTER — Other Ambulatory Visit: Payer: Self-pay

## 2021-01-27 ENCOUNTER — Encounter (HOSPITAL_COMMUNITY): Payer: Self-pay

## 2021-01-27 ENCOUNTER — Inpatient Hospital Stay (HOSPITAL_COMMUNITY)
Admission: EM | Admit: 2021-01-27 | Discharge: 2021-01-30 | DRG: 522 | Disposition: A | Discharge status: Home Health | Payer: Medicare Other | Attending: Family Medicine | Admitting: Family Medicine

## 2021-01-27 DIAGNOSIS — Y92009 Unspecified place in unspecified non-institutional (private) residence as the place of occurrence of the external cause: Secondary | ICD-10-CM

## 2021-01-27 DIAGNOSIS — M810 Age-related osteoporosis without current pathological fracture: Secondary | ICD-10-CM | POA: Diagnosis present

## 2021-01-27 DIAGNOSIS — S0990XA Unspecified injury of head, initial encounter: Secondary | ICD-10-CM | POA: Diagnosis not present

## 2021-01-27 DIAGNOSIS — J323 Chronic sphenoidal sinusitis: Secondary | ICD-10-CM | POA: Diagnosis not present

## 2021-01-27 DIAGNOSIS — J0130 Acute sphenoidal sinusitis, unspecified: Secondary | ICD-10-CM | POA: Diagnosis not present

## 2021-01-27 DIAGNOSIS — M47817 Spondylosis without myelopathy or radiculopathy, lumbosacral region: Secondary | ICD-10-CM | POA: Diagnosis not present

## 2021-01-27 DIAGNOSIS — Z888 Allergy status to other drugs, medicaments and biological substances status: Secondary | ICD-10-CM | POA: Diagnosis not present

## 2021-01-27 DIAGNOSIS — Z20822 Contact with and (suspected) exposure to covid-19: Secondary | ICD-10-CM | POA: Diagnosis present

## 2021-01-27 DIAGNOSIS — Z87891 Personal history of nicotine dependence: Secondary | ICD-10-CM

## 2021-01-27 DIAGNOSIS — I6789 Other cerebrovascular disease: Secondary | ICD-10-CM | POA: Diagnosis not present

## 2021-01-27 DIAGNOSIS — M1990 Unspecified osteoarthritis, unspecified site: Secondary | ICD-10-CM | POA: Diagnosis present

## 2021-01-27 DIAGNOSIS — S72002A Fracture of unspecified part of neck of left femur, initial encounter for closed fracture: Secondary | ICD-10-CM | POA: Diagnosis not present

## 2021-01-27 DIAGNOSIS — E871 Hypo-osmolality and hyponatremia: Secondary | ICD-10-CM | POA: Diagnosis present

## 2021-01-27 DIAGNOSIS — R9431 Abnormal electrocardiogram [ECG] [EKG]: Secondary | ICD-10-CM | POA: Diagnosis not present

## 2021-01-27 DIAGNOSIS — E7800 Pure hypercholesterolemia, unspecified: Secondary | ICD-10-CM | POA: Diagnosis present

## 2021-01-27 DIAGNOSIS — R0902 Hypoxemia: Secondary | ICD-10-CM | POA: Diagnosis not present

## 2021-01-27 DIAGNOSIS — Z01818 Encounter for other preprocedural examination: Secondary | ICD-10-CM | POA: Diagnosis not present

## 2021-01-27 DIAGNOSIS — S0101XA Laceration without foreign body of scalp, initial encounter: Secondary | ICD-10-CM | POA: Diagnosis present

## 2021-01-27 DIAGNOSIS — Z885 Allergy status to narcotic agent status: Secondary | ICD-10-CM | POA: Diagnosis not present

## 2021-01-27 DIAGNOSIS — M25552 Pain in left hip: Secondary | ICD-10-CM | POA: Diagnosis not present

## 2021-01-27 DIAGNOSIS — Z90710 Acquired absence of both cervix and uterus: Secondary | ICD-10-CM

## 2021-01-27 DIAGNOSIS — S72012A Unspecified intracapsular fracture of left femur, initial encounter for closed fracture: Principal | ICD-10-CM | POA: Diagnosis present

## 2021-01-27 DIAGNOSIS — D62 Acute posthemorrhagic anemia: Secondary | ICD-10-CM | POA: Diagnosis not present

## 2021-01-27 DIAGNOSIS — G319 Degenerative disease of nervous system, unspecified: Secondary | ICD-10-CM | POA: Diagnosis not present

## 2021-01-27 DIAGNOSIS — Z79899 Other long term (current) drug therapy: Secondary | ICD-10-CM

## 2021-01-27 DIAGNOSIS — W19XXXA Unspecified fall, initial encounter: Secondary | ICD-10-CM | POA: Diagnosis not present

## 2021-01-27 DIAGNOSIS — Z96642 Presence of left artificial hip joint: Secondary | ICD-10-CM | POA: Diagnosis not present

## 2021-01-27 DIAGNOSIS — E785 Hyperlipidemia, unspecified: Secondary | ICD-10-CM | POA: Diagnosis present

## 2021-01-27 DIAGNOSIS — W01198A Fall on same level from slipping, tripping and stumbling with subsequent striking against other object, initial encounter: Secondary | ICD-10-CM | POA: Diagnosis present

## 2021-01-27 DIAGNOSIS — Z471 Aftercare following joint replacement surgery: Secondary | ICD-10-CM | POA: Diagnosis not present

## 2021-01-27 DIAGNOSIS — Z881 Allergy status to other antibiotic agents status: Secondary | ICD-10-CM

## 2021-01-27 DIAGNOSIS — Z9889 Other specified postprocedural states: Secondary | ICD-10-CM

## 2021-01-27 DIAGNOSIS — R29818 Other symptoms and signs involving the nervous system: Secondary | ICD-10-CM | POA: Diagnosis not present

## 2021-01-27 LAB — CBC WITH DIFFERENTIAL/PLATELET
Abs Immature Granulocytes: 0.09 10*3/uL — ABNORMAL HIGH (ref 0.00–0.07)
Basophils Absolute: 0 10*3/uL (ref 0.0–0.1)
Basophils Relative: 0 %
Eosinophils Absolute: 0 10*3/uL (ref 0.0–0.5)
Eosinophils Relative: 0 %
HCT: 39.4 % (ref 36.0–46.0)
Hemoglobin: 12.6 g/dL (ref 12.0–15.0)
Immature Granulocytes: 1 %
Lymphocytes Relative: 10 %
Lymphs Abs: 1.3 10*3/uL (ref 0.7–4.0)
MCH: 30 pg (ref 26.0–34.0)
MCHC: 32 g/dL (ref 30.0–36.0)
MCV: 93.8 fL (ref 80.0–100.0)
Monocytes Absolute: 0.8 10*3/uL (ref 0.1–1.0)
Monocytes Relative: 7 %
Neutro Abs: 10.2 10*3/uL — ABNORMAL HIGH (ref 1.7–7.7)
Neutrophils Relative %: 82 %
Platelets: 243 10*3/uL (ref 150–400)
RBC: 4.2 MIL/uL (ref 3.87–5.11)
RDW: 13.1 % (ref 11.5–15.5)
WBC: 12.4 10*3/uL — ABNORMAL HIGH (ref 4.0–10.5)
nRBC: 0 % (ref 0.0–0.2)

## 2021-01-27 LAB — RESP PANEL BY RT-PCR (FLU A&B, COVID) ARPGX2
Influenza A by PCR: NEGATIVE
Influenza B by PCR: NEGATIVE
SARS Coronavirus 2 by RT PCR: NEGATIVE

## 2021-01-27 LAB — BASIC METABOLIC PANEL
Anion gap: 8 (ref 5–15)
BUN: 30 mg/dL — ABNORMAL HIGH (ref 8–23)
CO2: 22 mmol/L (ref 22–32)
Calcium: 8.7 mg/dL — ABNORMAL LOW (ref 8.9–10.3)
Chloride: 104 mmol/L (ref 98–111)
Creatinine, Ser: 0.84 mg/dL (ref 0.44–1.00)
GFR, Estimated: >60 mL/min (ref >60)
Glucose, Bld: 109 mg/dL — ABNORMAL HIGH (ref 70–99)
Potassium: 3.9 mmol/L (ref 3.5–5.1)
Sodium: 134 mmol/L — ABNORMAL LOW (ref 135–145)

## 2021-01-27 MED ORDER — CEFAZOLIN SODIUM-DEXTROSE 2-4 GM/100ML-% IV SOLN
2.0000 g | INTRAVENOUS | Status: AC
  Administered 2021-01-28: 2 g via INTRAVENOUS

## 2021-01-27 MED ORDER — MORPHINE SULFATE (PF) 2 MG/ML IV SOLN
2.0000 mg | INTRAVENOUS | Status: DC | PRN
  Administered 2021-01-27: 2 mg via INTRAVENOUS
  Filled 2021-01-27: qty 1

## 2021-01-27 MED ORDER — LIDOCAINE-EPINEPHRINE-TETRACAINE (LET) TOPICAL GEL: 3.0000 mL | Freq: Once | TOPICAL | Status: DC

## 2021-01-27 MED ORDER — LIDOCAINE-EPINEPHRINE (PF) 2 %-1:200000 IJ SOLN: 10.0000 mL | Freq: Once | INTRAMUSCULAR | Status: AC

## 2021-01-27 MED ORDER — MORPHINE SULFATE (PF) 2 MG/ML IV SOLN
1.0000 mg | INTRAVENOUS | Status: DC | PRN
  Administered 2021-01-27: 2 mg via INTRAVENOUS
  Filled 2021-01-27: qty 1

## 2021-01-27 MED ORDER — ACETAMINOPHEN 325 MG PO TABS: 650.0000 mg | ORAL_TABLET | Freq: Four times a day (QID) | ORAL | Status: DC | PRN

## 2021-01-27 MED ORDER — LIDOCAINE-EPINEPHRINE (PF) 2 %-1:200000 IJ SOLN
INTRAMUSCULAR | Status: AC
  Administered 2021-01-27: 10 mL via INTRADERMAL
  Filled 2021-01-27: qty 20

## 2021-01-27 MED ORDER — ONDANSETRON HCL 4 MG/2ML IJ SOLN: 4.0000 mg | Freq: Four times a day (QID) | INTRAMUSCULAR | Status: DC | PRN

## 2021-01-27 MED ORDER — TRANEXAMIC ACID-NACL 1000-0.7 MG/100ML-% IV SOLN: 1000.0000 mg | INTRAVENOUS | Status: DC

## 2021-01-27 MED ORDER — ONDANSETRON HCL 4 MG/2ML IJ SOLN
4.0000 mg | Freq: Once | INTRAMUSCULAR | Status: AC
  Administered 2021-01-27: 4 mg via INTRAVENOUS
  Filled 2021-01-27: qty 2

## 2021-01-27 MED ORDER — SODIUM CHLORIDE 0.9 % IV SOLN: INTRAVENOUS | Status: DC

## 2021-01-27 MED ORDER — CHLORHEXIDINE GLUCONATE 4 % EX LIQD
60.0000 mL | Freq: Once | CUTANEOUS | Status: AC
  Administered 2021-01-28: 4 via TOPICAL

## 2021-01-27 MED ORDER — POVIDONE-IODINE 10 % EX SWAB
2.0000 "application " | Freq: Once | CUTANEOUS | Status: AC
  Administered 2021-01-28: 2 via TOPICAL

## 2021-01-27 NOTE — ED Provider Notes (Addendum)
[place] DEPT Provider Note   CSN: [id number] Arrival date & time: 01/27/21  1718     History Chief Complaint  Patient presents with  . Fall    [name] is a 87 y.o. female.  87 yo F with a chief complaint of a fall.  The patient was stacking up canned goods to take to the church when she thinks she stubbed her toe and fell onto her left side.  Struck her head on the banister and struck her left hip on the ground.  Complaining mostly of left hip pain was unable to get up and ended up yelling for help until someone happened to hear her.  She denies loss of consciousness denies neck pain denies back pain denies chest pain denies abdominal pain.  Denies blood thinner use.  No prior orthopedic surgery.  The history is provided by the patient.  Fall This is a new problem. The current episode started 1 to 2 hours ago. The problem occurs rarely. The problem has been resolved. Pertinent negatives include no chest pain, no headaches and no shortness of breath. The symptoms are aggravated by bending, twisting and walking. Nothing relieves the symptoms. She has tried nothing for the symptoms. The treatment provided no relief.       Past Medical History:  Diagnosis Date  . Arthritis   . Cataract   . Hx of colonic polyps   . Hypercholesterolemia   . Lumbar back pain   . Neck pain   . Osteoporosis   . Swelling of ankle 01/12/16   bilateral. Pt is taking lasix x 2 weeks and wearing compression stockings    Patient Active Problem List   Diagnosis Date Noted  . Closed left hip fracture, initial encounter ([place]) 01/27/2021  . Scalp laceration   . Palpitations 12/04/2012  . Chest pain 11/20/2012  . DJD (degenerative joint disease) 11/22/2011  . NECK PAIN 03/01/2008  . BACK PAIN, LUMBAR 03/01/2008  . COLONIC POLYPS 02/29/2008  . HYPERCHOLESTEROLEMIA 02/29/2008  . OSTEOPOROSIS 02/29/2008    Past Surgical History:  Procedure Laterality Date  .  ABDOMINAL HYSTERECTOMY  12/2003   DR. Neal--bso, A/P repair  . cataract surgery    . COLONOSCOPY    . LUMBAR LAMINECTOMY  1985   Dr. Gladstone Lighter  . POLYPECTOMY    . surgery for removal of rectal polyp  07/1995   Dr. Modena Morrow  . TUBAL LIGATION       OB History   No obstetric history on file.     Family History  Problem Relation Age of Onset  . Stroke Mother   . Heart Problems Sister        triple bipass  . Heart Problems Brother        blocked arteries, stent.  . Stomach cancer Neg Hx   . Colon cancer Neg Hx   . Pancreatic cancer Neg Hx   . Rectal cancer Neg Hx   . Colon polyps Neg Hx   . Esophageal cancer Neg Hx     Social History   Tobacco Use  . Smoking status: Former Smoker    Packs/day: 0.25    Years: 1.00    Pack years: 0.25    Types: Cigarettes    Quit date: 11/11/1954    Years since quitting: 66.2  . Smokeless tobacco: Never Used  Vaping Use  . Vaping Use: Never used  Substance Use Topics  . Alcohol use: Yes    Alcohol/week: 7.0  standard drinks    Types: 7 Standard drinks or equivalent per week    Comment: wine with dinner  . Drug use: No    Home Medications Prior to Admission medications   Medication Sig Start Date End Date Taking? Authorizing Provider  Calcium Carbonate-Vitamin D (CALCIUM-D PO) Take 1 tablet by mouth 2 (two) times daily.   Yes [provider]  Cholecalciferol (VITAMIN D3 PO) Take 1 tablet by mouth daily.   Yes [provider]  ezetimibe (ZETIA) 10 MG tablet Take 1 tablet (10 mg total) by mouth daily. you Patient taking differently: Take 10 mg by mouth daily. 10/02/20  Yes Sherren Mocha, MD  Glucosamine-Chondroitin (MOVE FREE PO) Take 1 tablet by mouth daily.   Yes [provider]  Multiple Vitamin (MULTIVITAMIN WITH MINERALS) TABS tablet Take 1 tablet by mouth daily.   Yes [provider]  TURMERIC PO Take 1 capsule by mouth 2 (two) times daily.   Yes [provider]    Allergies     Risedronate sodium, Cephalexin, and Hydrocodone  Review of Systems   Review of Systems  Constitutional: Negative for chills and fever.  HENT: Negative for congestion and rhinorrhea.   Eyes: Negative for redness and visual disturbance.  Respiratory: Negative for shortness of breath and wheezing.   Cardiovascular: Negative for chest pain and palpitations.  Gastrointestinal: Negative for nausea and vomiting.  Genitourinary: Negative for dysuria and urgency.  Musculoskeletal: Positive for arthralgias. Negative for myalgias.  Skin: Negative for pallor and wound.  Neurological: Negative for dizziness and headaches.    Physical Exam Updated Vital Signs BP (!) 142/71 (BP Location: Right Arm)   Pulse 71   Temp 98.5 F (36.9 C) (Oral)   Resp 12   Ht 4\' 11"  (1.499 m)   Wt 45.6 kg   SpO2 99%   BMI 20.30 kg/m   Physical Exam Vitals and nursing note reviewed.  Constitutional:      General: She is not in acute distress.    Appearance: She is well-developed. She is not diaphoretic.  HENT:     Head: Normocephalic.     Comments: Scalp laceration to the left parietal region proximately 3 cm in length. Eyes:     Pupils: Pupils are equal, round, and reactive to light.  Cardiovascular:     Rate and Rhythm: Normal rate and regular rhythm.     Heart sounds: No murmur heard. No friction rub. No gallop.   Pulmonary:     Effort: Pulmonary effort is normal.     Breath sounds: No wheezing or rales.  Abdominal:     General: There is no distension.     Palpations: Abdomen is soft.     Tenderness: There is no abdominal tenderness.  Musculoskeletal:        General: Tenderness present.     Cervical back: Normal range of motion and neck supple.     Comments: Pulse motor and sensation intact to the left lower extremity.  Left leg is shortened and externally rotated.  Pain with internal and external rotation.  Pelvis is stable.  Palpated from head to toe without any other noted areas of bony  tenderness.  Skin:    General: Skin is warm and dry.  Neurological:     Mental Status: She is alert and oriented to person, place, and time.  Psychiatric:        Behavior: Behavior normal.     ED Results / Procedures / Treatments   Labs (  all labs ordered are listed, but only abnormal results are displayed) Labs Reviewed  BASIC METABOLIC PANEL - Abnormal; Notable for the following components:      Result Value   Sodium 134 (*)    Glucose, Bld 109 (*)    BUN 30 (*)    Calcium 8.7 (*)    All other components within normal limits  CBC WITH DIFFERENTIAL/PLATELET - Abnormal; Notable for the following components:   WBC 12.4 (*)    Neutro Abs 10.2 (*)    Abs Immature Granulocytes 0.09 (*)    All other components within normal limits  RESP PANEL BY RT-PCR (FLU A&B, COVID) ARPGX2  SURGICAL PCR SCREEN  CBC  BASIC METABOLIC PANEL  TYPE AND SCREEN    EKG None  Radiology CT Head Wo Contrast  Result Date: 01/27/2021 CLINICAL DATA:  Head trauma, minor. Additional provided: Fall at home hitting left head on banister. Laceration to left head. EXAM: CT HEAD WITHOUT CONTRAST TECHNIQUE: Contiguous axial images were obtained from the base of the skull through the vertex without intravenous contrast. COMPARISON:  No pertinent prior exams available for comparison. FINDINGS: Brain: Mild cerebral atrophy. There is no acute intracranial hemorrhage. No demarcated cortical infarct. No extra-axial fluid collection. No evidence of intracranial mass. No midline shift. Vascular: No hyperdense vessel.  Atherosclerotic calcifications Skull: Normal. Negative for fracture or focal lesion. Sinuses/Orbits: Visualized orbits show no acute finding. Small left sphenoid sinus fluid level. IMPRESSION: No evidence of acute intracranial abnormality. Mild cerebral atrophy. Left sphenoid sinusitis. Electronically Signed   By: Kellie Simmering DO   On: 01/27/2021 18:26   DG Chest Port 1 View  Result Date: 01/27/2021 CLINICAL  DATA:  Preop EXAM: PORTABLE CHEST 1 VIEW COMPARISON:  03/02/2014 FINDINGS: Mild peribronchial thickening. Heart and mediastinal contours are within normal limits. No focal opacities or effusions. No acute bony abnormality. IMPRESSION: Mild bronchitic changes. Electronically Signed   By: Rolm Baptise M.D.   On: 01/27/2021 18:17   DG Hip Unilat With Pelvis 2-3 Views Left  Result Date: 01/27/2021 CLINICAL DATA:  Left hip pain after a trip and fall today. Initial encounter. EXAM: DG HIP (WITH OR WITHOUT PELVIS) 2-3V LEFT COMPARISON:  None. FINDINGS: The patient has an acute subcapital fracture of the left hip. No other acute abnormality is identified. Lower lumbar spondylosis noted. IMPRESSION: Acute subcapital fracture left hip. Electronically Signed   By: Inge Rise M.D.   On: 01/27/2021 18:17    Procedures .Marland KitchenLaceration Repair  Date/Time: 01/31/2021 11:01 PM Performed by: Deno Etienne, DO Authorized by: Deno Etienne, DO   Consent:    Consent obtained:  Verbal   Consent given by:  Patient   Risks discussed:  Infection, pain, poor cosmetic result and poor wound healing   Alternatives discussed:  No treatment, delayed treatment and observation Universal protocol:    Procedure explained and questions answered to patient or proxy's satisfaction: yes     Patient identity confirmed:  Verbally with patient Anesthesia:    Anesthesia method:  Local infiltration   Local anesthetic:  Lidocaine 2% WITH epi Laceration details:    Location:  Scalp   Scalp location:  L parietal   Length (cm):  3.5 Exploration:    Limited defect created (wound extended): no     Hemostasis achieved with:  Epinephrine and direct pressure   Wound exploration: entire depth of wound visualized     Contaminated: no   Treatment:    Area cleansed with:  Saline  Amount of cleaning:  Standard   Irrigation solution:  Sterile saline   Irrigation volume:  40   Irrigation method:  Syringe   Visualized foreign  bodies/material removed: no     Debridement:  None   Undermining:  None   Scar revision: no   Skin repair:    Repair method:  Sutures   Suture size:  5-0   Suture material:  Fast-absorbing gut   Suture technique:  Simple interrupted   Number of sutures:  4 Approximation:    Approximation:  Close Repair type:    Repair type:  Simple Post-procedure details:    Dressing:  Adhesive bandage   Procedure completion:  Tolerated well, no immediate complications     Medications Ordered in ED Medications  morphine 2 MG/ML injection 2 mg (2 mg Intravenous Given 01/27/21 1949)  morphine 2 MG/ML injection 1-2 mg (2 mg Intravenous Given 01/27/21 2134)  acetaminophen (TYLENOL) tablet 650 mg (has no administration in time range)  ondansetron (ZOFRAN) injection 4 mg (has no administration in time range)  0.9 %  sodium chloride infusion ( Intravenous New Bag/Given 01/27/21 2141)  chlorhexidine (HIBICLENS) 4 % liquid 4 application (has no administration in time range)  povidone-iodine 10 % swab 2 application (has no administration in time range)  ceFAZolin (ANCEF) IVPB 2g/100 mL premix (has no administration in time range)  tranexamic acid (CYKLOKAPRON) IVPB 1,000 mg (has no administration in time range)  ondansetron (ZOFRAN) injection 4 mg (4 mg Intravenous Given 01/27/21 1832)  lidocaine-EPINEPHrine (XYLOCAINE W/EPI) 2 %-1:200000 (PF) injection 10 mL (10 mLs Intradermal Given by Other 01/27/21 1841)    ED Course  I have reviewed the triage vital signs and the nursing notes.  Pertinent labs & imaging results that were available during my care of the patient were reviewed by me and considered in my medical decision making (see chart for details).    MDM Rules/Calculators/A&P                          87 yo F with a chief complaints of left hip pain after a fall.  Nonsyncopal by history.  Likely left hip fracture based on exam.  Will obtain a CT scan of the head chest x-ray blood work EKG.  Plain  film of the left hip viewed by me with femoral neck fracture.  Will discuss with orthopedics.  I discussed case with Dr. Lorin Mercy plans to do the operation tomorrow.  Discussed with hospitalist for admission.  The patients results and plan were reviewed and discussed.   Any x-rays performed were independently reviewed by myself.   Differential diagnosis were considered with the presenting HPI.  Medications  morphine 2 MG/ML injection 2 mg (2 mg Intravenous Given 01/27/21 1949)  morphine 2 MG/ML injection 1-2 mg (2 mg Intravenous Given 01/27/21 2134)  acetaminophen (TYLENOL) tablet 650 mg (has no administration in time range)  ondansetron (ZOFRAN) injection 4 mg (has no administration in time range)  0.9 %  sodium chloride infusion ( Intravenous New Bag/Given 01/27/21 2141)  chlorhexidine (HIBICLENS) 4 % liquid 4 application (has no administration in time range)  povidone-iodine 10 % swab 2 application (has no administration in time range)  ceFAZolin (ANCEF) IVPB 2g/100 mL premix (has no administration in time range)  tranexamic acid (CYKLOKAPRON) IVPB 1,000 mg (has no administration in time range)  ondansetron (ZOFRAN) injection 4 mg (4 mg Intravenous Given 01/27/21 1832)  lidocaine-EPINEPHrine (XYLOCAINE W/EPI) 2 %-1:200000 (PF)  injection 10 mL (10 mLs Intradermal Given by Other 01/27/21 1841)    Vitals:   01/27/21 1930 01/27/21 2000 01/27/21 2124 01/27/21 2132  BP: 125/72 134/77 (!) 142/71   Pulse: 79 78 71   Resp: 14 12 12    Temp:   98.5 F (36.9 C)   TempSrc:   Oral   SpO2: 98% 98% 99%   Weight:    45.6 kg  Height:    4\' 11"  (1.499 m)    Final diagnoses:  Closed fracture of neck of left femur, initial encounter [facility])    Admission/ observation were discussed with the admitting physician, patient and/or family and they are comfortable with the plan.   Final Clinical Impression(s) / ED Diagnoses Final diagnoses:  Closed fracture of neck of left femur, initial encounter [facility])     Rx / [place] Orders ED Discharge Orders    None       Deno Etienne, DO 01/27/21 [place], DO 01/31/21 2303

## 2021-01-27 NOTE — ED Notes (Signed)
Selena Maryland, RN has reviewed the chart and accepts the patient. Patient ready for transfer.

## 2021-01-27 NOTE — H&P (View-Only) (Signed)
Reason for Consult:left femoral neck fracture Referring Physician:  Mitzi Hansen, MD  [name] is an 87 y.o. female.  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  She is unable to walk.  Neighbor came to help her.  She denies losing consciousness.  Head CT work-up in the ER was negative.  No past history of hip problems.  X-rays in the ER demonstrated a femoral neck fracture on the left, closed and displaced.  Pain was immediate and severe.  Past Medical History:  Diagnosis Date  . Arthritis   . Cataract   . Hx of colonic polyps   . Hypercholesterolemia   . Lumbar back pain   . Neck pain   . Osteoporosis   . Swelling of ankle 01/12/16   bilateral. Pt is taking lasix x 2 weeks and wearing compression stockings    Past Surgical History:  Procedure Laterality Date  . ABDOMINAL HYSTERECTOMY  12/2003   DR. Neal--bso, A/P repair  . cataract surgery    . COLONOSCOPY    . LUMBAR LAMINECTOMY  1985   Dr. Gladstone Lighter  . POLYPECTOMY    . surgery for removal of rectal polyp  07/1995   Dr. Modena Morrow  . TUBAL LIGATION      Family History  Problem Relation Age of Onset  . Stroke Mother   . Heart Problems Sister        triple bipass  . Heart Problems Brother        blocked arteries, stent.  . Stomach cancer Neg Hx   . Colon cancer Neg Hx   . Pancreatic cancer Neg Hx   . Rectal cancer Neg Hx   . Colon polyps Neg Hx   . Esophageal cancer Neg Hx     Social History:  reports that she quit smoking about 66 years ago. Her smoking use included cigarettes. She has a 0.25 pack-year smoking history. She has never used smokeless tobacco. She reports current alcohol use of about 7.0 standard drinks of alcohol per week. She reports that she does not use drugs.  Allergies:  Allergies  Allergen Reactions  . Risedronate Sodium Other (See Comments)     Reaction to Actonel  . Cephalexin Other (See Comments)    Upset stomach and made very tired  . Hydrocodone Nausea Only    Medications: I have reviewed the patient's current medications.  Results for orders placed or performed during the hospital encounter of 01/27/21 (from the past 48 hour(s))  Resp Panel by RT-PCR (Flu A&B, Covid) Nasopharyngeal Swab     Status: None   Collection Time: 01/27/21  5:40 PM   Specimen: Nasopharyngeal Swab; Nasopharyngeal(NP) swabs in vial transport medium  Result Value Ref Range   SARS Coronavirus 2 by RT PCR NEGATIVE NEGATIVE    Comment: (NOTE) SARS-CoV-2 target nucleic acids are NOT DETECTED.  The SARS-CoV-2 RNA is generally detectable in upper respiratory specimens during the acute phase of infection. The lowest concentration of SARS-CoV-2 viral copies this assay can detect is 138 copies/mL. A negative result does not preclude SARS-Cov-2 infection and should not be used as the sole basis for treatment or other patient management decisions. A negative result may occur with  improper specimen collection/handling, submission of specimen other than nasopharyngeal swab, presence of viral mutation(s) within the areas targeted by this assay, and inadequate number  of viral copies(<138 copies/mL). A negative result must be combined with clinical observations, patient history, and epidemiological information. The expected result is Negative.  Fact Sheet for Patients:  EntrepreneurPulse.com.au  Fact Sheet for Healthcare Providers:  IncredibleEmployment.be  This test is no t yet approved or cleared by the [place] FDA and  has been authorized for detection and/or diagnosis of SARS-CoV-2 by FDA under an Emergency Use Authorization (EUA). This EUA will remain  in effect (meaning this test can be used) for the duration of the COVID-19 declaration under Section 564(b)(1) of the Act, 21 U.S.C.section 360bbb-3(b)(1),  unless the authorization is terminated  or revoked sooner.       Influenza A by PCR NEGATIVE NEGATIVE   Influenza B by PCR NEGATIVE NEGATIVE    Comment: (NOTE) The Xpert Xpress SARS-CoV-2/FLU/RSV plus assay is intended as an aid in the diagnosis of influenza from Nasopharyngeal swab specimens and should not be used as a sole basis for treatment. Nasal washings and aspirates are unacceptable for Xpert Xpress SARS-CoV-2/FLU/RSV testing.  Fact Sheet for Patients: EntrepreneurPulse.com.au  Fact Sheet for Healthcare Providers: IncredibleEmployment.be  This test is not yet approved or cleared by the [place] FDA and has been authorized for detection and/or diagnosis of SARS-CoV-2 by FDA under an Emergency Use Authorization (EUA). This EUA will remain in effect (meaning this test can be used) for the duration of the COVID-19 declaration under Section 564(b)(1) of the Act, 21 U.S.C. section 360bbb-3(b)(1), unless the authorization is terminated or revoked.  Performed at [facility], [place] [address], [place], [place] [address]   Basic metabolic panel     Status: Abnormal   Collection Time: 01/27/21  6:27 PM  Result Value Ref Range   Sodium 134 (L) 135 - 145 mmol/L   Potassium 3.9 3.5 - 5.1 mmol/L   Chloride 104 98 - 111 mmol/L   CO2 22 22 - 32 mmol/L   Glucose, Bld 109 (H) 70 - 99 mg/dL    Comment: Glucose reference range applies only to samples taken after fasting for at least 8 hours.   BUN 30 (H) 8 - 23 mg/dL   Creatinine, Ser 0.84 0.44 - 1.00 mg/dL   Calcium 8.7 (L) 8.9 - 10.3 mg/dL   GFR, Estimated >60 >60 mL/min    Comment: (NOTE) Calculated using the CKD-EPI Creatinine Equation (2021)    Anion gap 8 5 - 15    Comment: Performed at [facility], [place] [address] [place], [place] [address]  CBC WITH DIFFERENTIAL     Status: Abnormal   Collection Time: 01/27/21  6:27 PM  Result Value Ref Range    WBC 12.4 (H) 4.0 - 10.5 K/uL   RBC 4.20 3.87 - 5.11 MIL/uL   Hemoglobin 12.6 12.0 - 15.0 g/dL   HCT 39.4 36.0 - 46.0 %   MCV 93.8 80.0 - 100.0 fL   MCH 30.0 26.0 - 34.0 pg   MCHC 32.0 30.0 - 36.0 g/dL   RDW 13.1 11.5 - 15.5 %   Platelets 243 150 - 400 K/uL   nRBC 0.0 0.0 - 0.2 %   Neutrophils Relative % 82 %   Neutro Abs 10.2 (H) 1.7 - 7.7 K/uL   Lymphocytes Relative 10 %   Lymphs Abs 1.3 0.7 - 4.0 K/uL   Monocytes Relative 7 %   Monocytes Absolute 0.8 0.1 - 1.0 K/uL   Eosinophils Relative 0 %   Eosinophils Absolute 0.0 0.0 - 0.5 K/uL  Basophils Relative 0 %   Basophils Absolute 0.0 0.0 - 0.1 K/uL   Immature Granulocytes 1 %   Abs Immature Granulocytes 0.09 (H) 0.00 - 0.07 K/uL    Comment: Performed at [facility], [place] [address] [place], [place] [address]  Type and screen [place]     Status: None (Preliminary result)   Collection Time: 01/27/21  6:27 PM  Result Value Ref Range   ABO/RH(D) PENDING    Antibody Screen      NEG Performed at [facility], [place] [address], [place], [place] [address]    Sample Expiration PENDING     CT Head Wo Contrast  Result Date: 01/27/2021 CLINICAL DATA:  Head trauma, minor. Additional provided: Fall at home hitting left head on banister. Laceration to left head. EXAM: CT HEAD WITHOUT CONTRAST TECHNIQUE: Contiguous axial images were obtained from the base of the skull through the vertex without intravenous contrast. COMPARISON:  No pertinent prior exams available for comparison. FINDINGS: Brain: Mild cerebral atrophy. There is no acute intracranial hemorrhage. No demarcated cortical infarct. No extra-axial fluid collection. No evidence of intracranial mass. No midline shift. Vascular: No hyperdense vessel.  Atherosclerotic calcifications Skull: Normal. Negative for fracture or focal lesion. Sinuses/Orbits: Visualized orbits show no acute finding. Small left sphenoid sinus fluid level.  IMPRESSION: No evidence of acute intracranial abnormality. Mild cerebral atrophy. Left sphenoid sinusitis. Electronically Signed   By: Kellie Simmering DO   On: 01/27/2021 18:26   DG Chest Port 1 View  Result Date: 01/27/2021 CLINICAL DATA:  Preop EXAM: PORTABLE CHEST 1 VIEW COMPARISON:  03/02/2014 FINDINGS: Mild peribronchial thickening. Heart and mediastinal contours are within normal limits. No focal opacities or effusions. No acute bony abnormality. IMPRESSION: Mild bronchitic changes. Electronically Signed   By: Rolm Baptise M.D.   On: 01/27/2021 18:17   DG Hip Unilat With Pelvis 2-3 Views Left  Result Date: 01/27/2021 CLINICAL DATA:  Left hip pain after a trip and fall today. Initial encounter. EXAM: DG HIP (WITH OR WITHOUT PELVIS) 2-3V LEFT COMPARISON:  None. FINDINGS: The patient has an acute subcapital fracture of the left hip. No other acute abnormality is identified. Lower lumbar spondylosis noted. IMPRESSION: Acute subcapital fracture left hip. Electronically Signed   By: Inge Rise M.D.   On: 01/27/2021 18:17    ROS past smoker quit 65 years ago.  Occasional alcohol use.  Positive for hypertension.  Cardiovascular negative.  Previous laminectomy years ago.  Tubal ligation hysterectomy without anesthetic problems. Blood pressure 134/77, pulse 78, temperature 98.5 F (36.9 C), temperature source Oral, resp. rate 12, SpO2 98 %. Physical Exam Constitutional:      Appearance: Normal appearance.  HENT:     Head: Normocephalic.     Nose: Nose normal.  Eyes:     Extraocular Movements: Extraocular movements intact.     Pupils: Pupils are equal, round, and reactive to light.  Cardiovascular:     Rate and Rhythm: Normal rate.  Pulmonary:     Effort: Pulmonary effort is normal.     Breath sounds: No stridor. No wheezing.  Musculoskeletal:     Cervical back: Normal range of motion.  Skin:    General: Skin is warm.  Neurological:     General: No focal deficit present.     Mental  Status: She is alert.  Psychiatric:        Mood and Affect: Mood normal.        Behavior: Behavior  normal.     Assessment/Plan: Displaced left femoral neck fracture.  Patient's daughter at the bedside included in the discussion.  Plan would be cemented bipolar hemiarthroplasty so she could begin immediate weightbearing.  Risk surgery discussed including fracture, dislocation, reoperation.  Questions elicited and answered she understands request to proceed.  [name] 01/27/2021, 8:48 PM

## 2021-01-27 NOTE — ED Notes (Signed)
ED TO INPATIENT HANDOFF REPORT  Name/Age/Gender [name] 87 y.o. female  Code Status    Code Status Orders  (From admission, onward)         Start     Ordered   01/27/21 1952  Full code  Continuous        01/27/21 1951        Code Status History    This patient has a current code status but no historical code status.   Advance Care Planning Activity      Home/SNF/Other Home  Chief Complaint Closed left hip fracture, initial encounter ([facility]) [S72.002A]  Level of Care/Admitting Diagnosis ED Disposition    ED Disposition Condition [facility] Area: [facility] [facility] [[id number]]  Level of Care: Med-Surg [16]  May admit patient to [facility] or [facility] if equivalent level of care is available:: No  Covid Evaluation: Asymptomatic Screening Protocol (No Symptoms)  Diagnosis: Closed left hip fracture, initial encounter [facility]) [[id number]]  Admitting Physician: Vianne Bulls [[id number]]  Attending Physician: Vianne Bulls [[id number]]  Estimated length of stay: past midnight tomorrow  Certification:: I certify this patient will need inpatient services for at least 2 midnights       Medical History Past Medical History:  Diagnosis Date  . Arthritis   . Cataract   . Hx of colonic polyps   . Hypercholesterolemia   . Lumbar back pain   . Neck pain   . Osteoporosis   . Swelling of ankle 01/12/16   bilateral. Pt is taking lasix x 2 weeks and wearing compression stockings    Allergies Allergies  Allergen Reactions  . Risedronate Sodium Other (See Comments)    Reaction to Actonel  . Cephalexin Other (See Comments)    Upset stomach and made very tired  . Hydrocodone Nausea Only    IV Location/Drains/Wounds Patient Lines/Drains/Airways Status    Active Line/Drains/Airways    Name Placement date Placement time Site Days   Peripheral IV 01/27/21 Left Forearm 01/27/21  --  Forearm  less than 1          Labs/Imaging Results for  orders placed or performed during the hospital encounter of 01/27/21 (from the past 48 hour(s))  Resp Panel by RT-PCR (Flu A&B, Covid) Nasopharyngeal Swab     Status: None   Collection Time: 01/27/21  5:40 PM   Specimen: Nasopharyngeal Swab; Nasopharyngeal(NP) swabs in vial transport medium  Result Value Ref Range   SARS Coronavirus 2 by RT PCR NEGATIVE NEGATIVE    Comment: (NOTE) SARS-CoV-2 target nucleic acids are NOT DETECTED.  The SARS-CoV-2 RNA is generally detectable in upper respiratory specimens during the acute phase of infection. The lowest concentration of SARS-CoV-2 viral copies this assay can detect is 138 copies/mL. A negative result does not preclude SARS-Cov-2 infection and should not be used as the sole basis for treatment or other patient management decisions. A negative result may occur with  improper specimen collection/handling, submission of specimen other than nasopharyngeal swab, presence of viral mutation(s) within the areas targeted by this assay, and inadequate number of viral copies(<138 copies/mL). A negative result must be combined with clinical observations, patient history, and epidemiological information. The expected result is Negative.  Fact Sheet for Patients:  EntrepreneurPulse.com.au  Fact Sheet for Healthcare Providers:  IncredibleEmployment.be  This test is no t yet approved or cleared by the [place] FDA and  has been authorized for detection and/or diagnosis of SARS-CoV-2 by  FDA under an Emergency Use Authorization (EUA). This EUA will remain  in effect (meaning this test can be used) for the duration of the COVID-19 declaration under Section 564(b)(1) of the Act, 21 U.S.C.section 360bbb-3(b)(1), unless the authorization is terminated  or revoked sooner.       Influenza A by PCR NEGATIVE NEGATIVE   Influenza B by PCR NEGATIVE NEGATIVE    Comment: (NOTE) The Xpert Xpress SARS-CoV-2/FLU/RSV plus  assay is intended as an aid in the diagnosis of influenza from Nasopharyngeal swab specimens and should not be used as a sole basis for treatment. Nasal washings and aspirates are unacceptable for Xpert Xpress SARS-CoV-2/FLU/RSV testing.  Fact Sheet for Patients: EntrepreneurPulse.com.au  Fact Sheet for Healthcare Providers: IncredibleEmployment.be  This test is not yet approved or cleared by the [place] FDA and has been authorized for detection and/or diagnosis of SARS-CoV-2 by FDA under an Emergency Use Authorization (EUA). This EUA will remain in effect (meaning this test can be used) for the duration of the COVID-19 declaration under Section 564(b)(1) of the Act, 21 U.S.C. section 360bbb-3(b)(1), unless the authorization is terminated or revoked.  Performed at [facility], [place] [address] [place], [place] [address]   Basic metabolic panel     Status: Abnormal   Collection Time: 01/27/21  6:27 PM  Result Value Ref Range   Sodium 134 (L) 135 - 145 mmol/L   Potassium 3.9 3.5 - 5.1 mmol/L   Chloride 104 98 - 111 mmol/L   CO2 22 22 - 32 mmol/L   Glucose, Bld 109 (H) 70 - 99 mg/dL    Comment: Glucose reference range applies only to samples taken after fasting for at least 8 hours.   BUN 30 (H) 8 - 23 mg/dL   Creatinine, Ser 0.84 0.44 - 1.00 mg/dL   Calcium 8.7 (L) 8.9 - 10.3 mg/dL   GFR, Estimated >60 >60 mL/min    Comment: (NOTE) Calculated using the CKD-EPI Creatinine Equation (2021)    Anion gap 8 5 - 15    Comment: Performed at [facility], [place] [address], [place], [place] [address]  CBC WITH DIFFERENTIAL     Status: Abnormal   Collection Time: 01/27/21  6:27 PM  Result Value Ref Range   WBC 12.4 (H) 4.0 - 10.5 K/uL   RBC 4.20 3.87 - 5.11 MIL/uL   Hemoglobin 12.6 12.0 - 15.0 g/dL   HCT 39.4 36.0 - 46.0 %   MCV 93.8 80.0 - 100.0 fL   MCH 30.0 26.0 - 34.0 pg   MCHC 32.0 30.0 - 36.0 g/dL    RDW 13.1 11.5 - 15.5 %   Platelets 243 150 - 400 K/uL   nRBC 0.0 0.0 - 0.2 %   Neutrophils Relative % 82 %   Neutro Abs 10.2 (H) 1.7 - 7.7 K/uL   Lymphocytes Relative 10 %   Lymphs Abs 1.3 0.7 - 4.0 K/uL   Monocytes Relative 7 %   Monocytes Absolute 0.8 0.1 - 1.0 K/uL   Eosinophils Relative 0 %   Eosinophils Absolute 0.0 0.0 - 0.5 K/uL   Basophils Relative 0 %   Basophils Absolute 0.0 0.0 - 0.1 K/uL   Immature Granulocytes 1 %   Abs Immature Granulocytes 0.09 (H) 0.00 - 0.07 K/uL    Comment: Performed at [facility], [place] [address], [place], [place] [address]   CT Head Wo Contrast  Result Date: 01/27/2021 CLINICAL DATA:  Head trauma, minor. Additional provided:  Fall at home hitting left head on banister. Laceration to left head. EXAM: CT HEAD WITHOUT CONTRAST TECHNIQUE: Contiguous axial images were obtained from the base of the skull through the vertex without intravenous contrast. COMPARISON:  No pertinent prior exams available for comparison. FINDINGS: Brain: Mild cerebral atrophy. There is no acute intracranial hemorrhage. No demarcated cortical infarct. No extra-axial fluid collection. No evidence of intracranial mass. No midline shift. Vascular: No hyperdense vessel.  Atherosclerotic calcifications Skull: Normal. Negative for fracture or focal lesion. Sinuses/Orbits: Visualized orbits show no acute finding. Small left sphenoid sinus fluid level. IMPRESSION: No evidence of acute intracranial abnormality. Mild cerebral atrophy. Left sphenoid sinusitis. Electronically Signed   By: Kellie Simmering DO   On: 01/27/2021 18:26   DG Chest Port 1 View  Result Date: 01/27/2021 CLINICAL DATA:  Preop EXAM: PORTABLE CHEST 1 VIEW COMPARISON:  03/02/2014 FINDINGS: Mild peribronchial thickening. Heart and mediastinal contours are within normal limits. No focal opacities or effusions. No acute bony abnormality. IMPRESSION: Mild bronchitic changes. Electronically Signed   By: Rolm Baptise M.D.   On: 01/27/2021 18:17   DG Hip Unilat With Pelvis 2-3 Views Left  Result Date: 01/27/2021 CLINICAL DATA:  Left hip pain after a trip and fall today. Initial encounter. EXAM: DG HIP (WITH OR WITHOUT PELVIS) 2-3V LEFT COMPARISON:  None. FINDINGS: The patient has an acute subcapital fracture of the left hip. No other acute abnormality is identified. Lower lumbar spondylosis noted. IMPRESSION: Acute subcapital fracture left hip. Electronically Signed   By: Inge Rise M.D.   On: 01/27/2021 18:17    Pending Labs Unresulted Labs (From admission, onward)          Start     Ordered   01/28/21 0500  CBC  Daily,   R      01/27/21 1951   01/28/21 6237  Basic metabolic panel  Daily,   R      01/27/21 1951   01/27/21 1739  Type and screen [place]  ONCE - STAT,   STAT       Comments: [place]    01/27/21 1739          Vitals/Pain Today's Vitals   01/27/21 1734 01/27/21 1830 01/27/21 1900 01/27/21 1930  BP: (!) 157/78 138/71 136/66 125/72  Pulse: 76 75 78 79  Resp: 18 19 10 14   Temp: 98.5 F (36.9 C)     TempSrc: Oral     SpO2: 96% 100% 100% 98%    Isolation Precautions No active isolations  Medications Medications  morphine 2 MG/ML injection 2 mg (2 mg Intravenous Given 01/27/21 1949)  morphine 2 MG/ML injection 1-2 mg (has no administration in time range)  0.9 %  sodium chloride infusion (has no administration in time range)  acetaminophen (TYLENOL) tablet 650 mg (has no administration in time range)  ondansetron (ZOFRAN) injection 4 mg (has no administration in time range)  ondansetron (ZOFRAN) injection 4 mg (4 mg Intravenous Given 01/27/21 1832)  lidocaine-EPINEPHrine (XYLOCAINE W/EPI) 2 %-1:200000 (PF) injection 10 mL (10 mLs Intradermal Given by Other 01/27/21 1841)    Mobility walks

## 2021-01-27 NOTE — Consult Note (Signed)
Reason for Consult:left femoral neck fracture Referring Physician:  Mitzi Hansen, MD  [name] is an 87 y.o. female.  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  She is unable to walk.  Neighbor came to help her.  She denies losing consciousness.  Head CT work-up in the ER was negative.  No past history of hip problems.  X-rays in the ER demonstrated a femoral neck fracture on the left, closed and displaced.  Pain was immediate and severe.  Past Medical History:  Diagnosis Date  . Arthritis   . Cataract   . Hx of colonic polyps   . Hypercholesterolemia   . Lumbar back pain   . Neck pain   . Osteoporosis   . Swelling of ankle 01/12/16   bilateral. Pt is taking lasix x 2 weeks and wearing compression stockings    Past Surgical History:  Procedure Laterality Date  . ABDOMINAL HYSTERECTOMY  12/2003   DR. Neal--bso, A/P repair  . cataract surgery    . COLONOSCOPY    . LUMBAR LAMINECTOMY  1985   Dr. Gladstone Lighter  . POLYPECTOMY    . surgery for removal of rectal polyp  07/1995   Dr. Modena Morrow  . TUBAL LIGATION      Family History  Problem Relation Age of Onset  . Stroke Mother   . Heart Problems Sister        triple bipass  . Heart Problems Brother        blocked arteries, stent.  . Stomach cancer Neg Hx   . Colon cancer Neg Hx   . Pancreatic cancer Neg Hx   . Rectal cancer Neg Hx   . Colon polyps Neg Hx   . Esophageal cancer Neg Hx     Social History:  reports that she quit smoking about 66 years ago. Her smoking use included cigarettes. She has a 0.25 pack-year smoking history. She has never used smokeless tobacco. She reports current alcohol use of about 7.0 standard drinks of alcohol per week. She reports that she does not use drugs.  Allergies:  Allergies  Allergen Reactions  . Risedronate Sodium Other (See Comments)     Reaction to Actonel  . Cephalexin Other (See Comments)    Upset stomach and made very tired  . Hydrocodone Nausea Only    Medications: I have reviewed the patient's current medications.  Results for orders placed or performed during the hospital encounter of 01/27/21 (from the past 48 hour(s))  Resp Panel by RT-PCR (Flu A&B, Covid) Nasopharyngeal Swab     Status: None   Collection Time: 01/27/21  5:40 PM   Specimen: Nasopharyngeal Swab; Nasopharyngeal(NP) swabs in vial transport medium  Result Value Ref Range   SARS Coronavirus 2 by RT PCR NEGATIVE NEGATIVE    Comment: (NOTE) SARS-CoV-2 target nucleic acids are NOT DETECTED.  The SARS-CoV-2 RNA is generally detectable in upper respiratory specimens during the acute phase of infection. The lowest concentration of SARS-CoV-2 viral copies this assay can detect is 138 copies/mL. A negative result does not preclude SARS-Cov-2 infection and should not be used as the sole basis for treatment or other patient management decisions. A negative result may occur with  improper specimen collection/handling, submission of specimen other than nasopharyngeal swab, presence of viral mutation(s) within the areas targeted by this assay, and inadequate number  of viral copies(<138 copies/mL). A negative result must be combined with clinical observations, patient history, and epidemiological information. The expected result is Negative.  Fact Sheet for Patients:  EntrepreneurPulse.com.au  Fact Sheet for Healthcare Providers:  IncredibleEmployment.be  This test is no t yet approved or cleared by the [place] FDA and  has been authorized for detection and/or diagnosis of SARS-CoV-2 by FDA under an Emergency Use Authorization (EUA). This EUA will remain  in effect (meaning this test can be used) for the duration of the COVID-19 declaration under Section 564(b)(1) of the Act, 21 U.S.C.section 360bbb-3(b)(1),  unless the authorization is terminated  or revoked sooner.       Influenza A by PCR NEGATIVE NEGATIVE   Influenza B by PCR NEGATIVE NEGATIVE    Comment: (NOTE) The Xpert Xpress SARS-CoV-2/FLU/RSV plus assay is intended as an aid in the diagnosis of influenza from Nasopharyngeal swab specimens and should not be used as a sole basis for treatment. Nasal washings and aspirates are unacceptable for Xpert Xpress SARS-CoV-2/FLU/RSV testing.  Fact Sheet for Patients: EntrepreneurPulse.com.au  Fact Sheet for Healthcare Providers: IncredibleEmployment.be  This test is not yet approved or cleared by the [place] FDA and has been authorized for detection and/or diagnosis of SARS-CoV-2 by FDA under an Emergency Use Authorization (EUA). This EUA will remain in effect (meaning this test can be used) for the duration of the COVID-19 declaration under Section 564(b)(1) of the Act, 21 U.S.C. section 360bbb-3(b)(1), unless the authorization is terminated or revoked.  Performed at [facility], [place] [address] [place], [place] [address]   Basic metabolic panel     Status: Abnormal   Collection Time: 01/27/21  6:27 PM  Result Value Ref Range   Sodium 134 (L) 135 - 145 mmol/L   Potassium 3.9 3.5 - 5.1 mmol/L   Chloride 104 98 - 111 mmol/L   CO2 22 22 - 32 mmol/L   Glucose, Bld 109 (H) 70 - 99 mg/dL    Comment: Glucose reference range applies only to samples taken after fasting for at least 8 hours.   BUN 30 (H) 8 - 23 mg/dL   Creatinine, Ser 0.84 0.44 - 1.00 mg/dL   Calcium 8.7 (L) 8.9 - 10.3 mg/dL   GFR, Estimated >60 >60 mL/min    Comment: (NOTE) Calculated using the CKD-EPI Creatinine Equation (2021)    Anion gap 8 5 - 15    Comment: Performed at [facility], [place] [address] [place], [place] [address]  CBC WITH DIFFERENTIAL     Status: Abnormal   Collection Time: 01/27/21  6:27 PM  Result Value Ref Range    WBC 12.4 (H) 4.0 - 10.5 K/uL   RBC 4.20 3.87 - 5.11 MIL/uL   Hemoglobin 12.6 12.0 - 15.0 g/dL   HCT 39.4 36.0 - 46.0 %   MCV 93.8 80.0 - 100.0 fL   MCH 30.0 26.0 - 34.0 pg   MCHC 32.0 30.0 - 36.0 g/dL   RDW 13.1 11.5 - 15.5 %   Platelets 243 150 - 400 K/uL   nRBC 0.0 0.0 - 0.2 %   Neutrophils Relative % 82 %   Neutro Abs 10.2 (H) 1.7 - 7.7 K/uL   Lymphocytes Relative 10 %   Lymphs Abs 1.3 0.7 - 4.0 K/uL   Monocytes Relative 7 %   Monocytes Absolute 0.8 0.1 - 1.0 K/uL   Eosinophils Relative 0 %   Eosinophils Absolute 0.0 0.0 - 0.5 K/uL  Basophils Relative 0 %   Basophils Absolute 0.0 0.0 - 0.1 K/uL   Immature Granulocytes 1 %   Abs Immature Granulocytes 0.09 (H) 0.00 - 0.07 K/uL    Comment: Performed at [facility], [place] [address] [place], [place] [address]  Type and screen [place]     Status: None (Preliminary result)   Collection Time: 01/27/21  6:27 PM  Result Value Ref Range   ABO/RH(D) PENDING    Antibody Screen      NEG Performed at [facility], [place] [address], [place],  [address]    Sample Expiration PENDING     CT Head Wo Contrast  Result Date: 01/27/2021 CLINICAL DATA:  Head trauma, minor. Additional provided: Fall at home hitting left head on banister. Laceration to left head. EXAM: CT HEAD WITHOUT CONTRAST TECHNIQUE: Contiguous axial images were obtained from the base of the skull through the vertex without intravenous contrast. COMPARISON:  No pertinent prior exams available for comparison. FINDINGS: Brain: Mild cerebral atrophy. There is no acute intracranial hemorrhage. No demarcated cortical infarct. No extra-axial fluid collection. No evidence of intracranial mass. No midline shift. Vascular: No hyperdense vessel.  Atherosclerotic calcifications Skull: Normal. Negative for fracture or focal lesion. Sinuses/Orbits: Visualized orbits show no acute finding. Small left sphenoid sinus fluid level.  IMPRESSION: No evidence of acute intracranial abnormality. Mild cerebral atrophy. Left sphenoid sinusitis. Electronically Signed   By: Kellie Simmering DO   On: 01/27/2021 18:26   DG Chest Port 1 View  Result Date: 01/27/2021 CLINICAL DATA:  Preop EXAM: PORTABLE CHEST 1 VIEW COMPARISON:  03/02/2014 FINDINGS: Mild peribronchial thickening. Heart and mediastinal contours are within normal limits. No focal opacities or effusions. No acute bony abnormality. IMPRESSION: Mild bronchitic changes. Electronically Signed   By: Rolm Baptise M.D.   On: 01/27/2021 18:17   DG Hip Unilat With Pelvis 2-3 Views Left  Result Date: 01/27/2021 CLINICAL DATA:  Left hip pain after a trip and fall today. Initial encounter. EXAM: DG HIP (WITH OR WITHOUT PELVIS) 2-3V LEFT COMPARISON:  None. FINDINGS: The patient has an acute subcapital fracture of the left hip. No other acute abnormality is identified. Lower lumbar spondylosis noted. IMPRESSION: Acute subcapital fracture left hip. Electronically Signed   By: Inge Rise M.D.   On: 01/27/2021 18:17    ROS past smoker quit 65 years ago.  Occasional alcohol use.  Positive for hypertension.  Cardiovascular negative.  Previous laminectomy years ago.  Tubal ligation hysterectomy without anesthetic problems. Blood pressure 134/77, pulse 78, temperature 98.5 F (36.9 C), temperature source Oral, resp. rate 12, SpO2 98 %. Physical Exam Constitutional:      Appearance: Normal appearance.  HENT:     Head: Normocephalic.     Nose: Nose normal.  Eyes:     Extraocular Movements: Extraocular movements intact.     Pupils: Pupils are equal, round, and reactive to light.  Cardiovascular:     Rate and Rhythm: Normal rate.  Pulmonary:     Effort: Pulmonary effort is normal.     Breath sounds: No stridor. No wheezing.  Musculoskeletal:     Cervical back: Normal range of motion.  Skin:    General: Skin is warm.  Neurological:     General: No focal deficit present.     Mental  Status: She is alert.  Psychiatric:        Mood and Affect: Mood normal.        Behavior: Behavior  normal.     Assessment/Plan: Displaced left femoral neck fracture.  Patient's daughter at the bedside included in the discussion.  Plan would be cemented bipolar hemiarthroplasty so she could begin immediate weightbearing.  Risk surgery discussed including fracture, dislocation, reoperation.  Questions elicited and answered she understands request to proceed.  [name] 01/27/2021, 8:48 PM

## 2021-01-27 NOTE — H&P (Signed)
History and Physical    KEILEIGH VAHEY TKW:[id number] DOB: 29-Jul-1933 DOA: 01/27/2021  PCP: Crist Infante, MD   Patient coming from: Home   Chief Complaint: fall, left hip pain  HPI: KATHELEEN STELLA is a 87 y.o. female with medical history significant for hyperlipidemia and osteoporosis, now presenting to the emergency department for evaluation of left hip pain and scalp laceration after a fall at home.  Patient reports she was in her usual state of health and was doing some work on her porch at home when she tripped and fell, striking the left side of her head on the handrail before falling onto her left side.  She had immediate and severe pain at the left hip and she was on the ground for roughly 25 minutes before a neighbor came to help her. She denies losing consciousness and does not have any pain or suspected injury apart from the left hip. She denies any recent illness. She exercises regularly, often walking 5 miles at a time.   ED Course: Upon arrival to the ED, patient is found to be afebrile, saturating well on room air, and with stable blood pressure.  Sinus arrhythmia.  Noncontrast head CT is negative for acute intracranial abnormality.  Chest x-ray notable for mild bronchitic changes.  Radiographs of the left hip demonstrate acute subcapital panel with slight hyponatremia and an elevated BUN to creatinine ratio.  CBC with mild leukocytosis.  Consider department and orthopedic surgery was consulted by the ED physician.  Covid screening test not yet resulted. Left scalp laceration was stapled.   Review of Systems:  All other systems reviewed and apart from HPI, are negative.  Past Medical History:  Diagnosis Date  . Arthritis   . Cataract   . Hx of colonic polyps   . Hypercholesterolemia   . Lumbar back pain   . Neck pain   . Osteoporosis   . Swelling of ankle 01/12/16   bilateral. Pt is taking lasix x 2 weeks and wearing compression stockings    Past Surgical History:   Procedure Laterality Date  . ABDOMINAL HYSTERECTOMY  12/2003   DR. Neal--bso, A/P repair  . cataract surgery    . COLONOSCOPY    . LUMBAR LAMINECTOMY  1985   Dr. Gladstone Lighter  . POLYPECTOMY    . surgery for removal of rectal polyp  07/1995   Dr. Modena Morrow  . TUBAL LIGATION      Social History:   reports that she quit smoking about 66 years ago. Her smoking use included cigarettes. She has a 0.25 pack-year smoking history. She has never used smokeless tobacco. She reports current alcohol use of about 7.0 standard drinks of alcohol per week. She reports that she does not use drugs.  Allergies  Allergen Reactions  . Risedronate Sodium Other (See Comments)    Reaction to Actonel  . Cephalexin Other (See Comments)    Upset stomach and made very tired  . Hydrocodone Nausea Only    Family History  Problem Relation Age of Onset  . Stroke Mother   . Heart Problems Sister        triple bipass  . Heart Problems Brother        blocked arteries, stent.  . Stomach cancer Neg Hx   . Colon cancer Neg Hx   . Pancreatic cancer Neg Hx   . Rectal cancer Neg Hx   . Colon polyps Neg Hx   . Esophageal cancer Neg Hx  Prior to Admission medications   Medication Sig Start Date End Date Taking? Authorizing Provider  Calcium Carbonate-Vitamin D (CALCIUM-D PO) Take 1 tablet by mouth 2 (two) times daily.   Yes [provider]  Cholecalciferol (VITAMIN D3 PO) Take 1 tablet by mouth daily.   Yes [provider]  ezetimibe (ZETIA) 10 MG tablet Take 1 tablet (10 mg total) by mouth daily. you Patient taking differently: Take 10 mg by mouth daily. 10/02/20  Yes Sherren Mocha, MD  Glucosamine-Chondroitin (MOVE FREE PO) Take 1 tablet by mouth daily.   Yes [provider]  Multiple Vitamin (MULTIVITAMIN WITH MINERALS) TABS tablet Take 1 tablet by mouth daily.   Yes [provider]  TURMERIC PO Take 1 capsule by mouth 2 (two) times daily.   Yes [provider]     Physical Exam: Vitals:   01/27/21 1734 01/27/21 1830 01/27/21 1900 01/27/21 1930  BP: (!) 157/78 138/71 136/66 125/72  Pulse: 76 75 78 79  Resp: 18 19 10 14   Temp: 98.5 F (36.9 C)     TempSrc: Oral     SpO2: 96% 100% 100% 98%    Constitutional: NAD, calm  Eyes: PERTLA, lids and conjunctivae normal ENMT: Mucous membranes are moist. Posterior pharynx clear of any exudate or lesions.   Neck: normal, supple, no masses, no thyromegaly Respiratory: no wheezing, no crackles. No accessory muscle use.  Cardiovascular: S1 & S2 heard, regular rate and rhythm. No extremity edema.   Abdomen: No distension, no tenderness, soft. Bowel sounds active.  Musculoskeletal: no clubbing / cyanosis. No joint deformity upper and lower extremities.   Skin: no significant rashes, lesions, ulcers. Warm, dry, well-perfused. Neurologic: CN 2-12 grossly intact. Sensation intact. Moving all extremities.  Psychiatric: Alert and oriented to person, place, and situation. Calm and cooperative.    Labs and Imaging on Admission: I have personally reviewed following labs and imaging studies  CBC: Recent Labs  Lab 01/27/21 1827  WBC 12.4*  NEUTROABS 10.2*  HGB 12.6  HCT 39.4  MCV 93.8  PLT 469   Basic Metabolic Panel: Recent Labs  Lab 01/27/21 1827  NA 134*  K 3.9  CL 104  CO2 22  GLUCOSE 109*  BUN 30*  CREATININE 0.84  CALCIUM 8.7*   GFR: CrCl cannot be calculated (Unknown ideal weight.). Liver Function Tests: No results for input(s): AST, ALT, ALKPHOS, BILITOT, PROT, ALBUMIN in the last 168 hours. No results for input(s): LIPASE, AMYLASE in the last 168 hours. No results for input(s): AMMONIA in the last 168 hours. Coagulation Profile: No results for input(s): INR, PROTIME in the last 168 hours. Cardiac Enzymes: No results for input(s): CKTOTAL, CKMB, CKMBINDEX, TROPONINI in the last 168 hours. BNP (last 3 results) No results for input(s): PROBNP in the last 8760 hours. HbA1C: No  results for input(s): HGBA1C in the last 72 hours. CBG: No results for input(s): GLUCAP in the last 168 hours. Lipid Profile: No results for input(s): CHOL, HDL, LDLCALC, TRIG, CHOLHDL, LDLDIRECT in the last 72 hours. Thyroid Function Tests: No results for input(s): TSH, T4TOTAL, FREET4, T3FREE, THYROIDAB in the last 72 hours. Anemia Panel: No results for input(s): VITAMINB12, FOLATE, FERRITIN, TIBC, IRON, RETICCTPCT in the last 72 hours. Urine analysis:    Component Value Date/Time   COLORURINE YELLOW 01/07/2007 1007   APPEARANCEUR Turbid 01/07/2007 1007   LABSPEC 1.015 01/07/2007 1007   PHURINE 8.0 01/07/2007 1007   GLUCOSEU NEGATIVE 01/07/2007 1007   BILIRUBINUR negative 02/13/2017 1536  KETONESUR negative 02/13/2017 1536   KETONESUR TRACE (A) 01/07/2007 1007   PROTEINUR trace (A) 02/13/2017 1536   UROBILINOGEN 0.2 02/13/2017 1536   UROBILINOGEN 0.2 mg/dL 01/07/2007 1007   NITRITE Negative 02/13/2017 1536   NITRITE Negative 01/07/2007 1007   LEUKOCYTESUR small (1+) (A) 02/13/2017 1536   Sepsis Labs: @LABRCNTIP (procalcitonin:4,lacticidven:4) ) Recent Results (from the past 240 hour(s))  Resp Panel by RT-PCR (Flu A&B, Covid) Nasopharyngeal Swab     Status: None   Collection Time: 01/27/21  5:40 PM   Specimen: Nasopharyngeal Swab; Nasopharyngeal(NP) swabs in vial transport medium  Result Value Ref Range Status   SARS Coronavirus 2 by RT PCR NEGATIVE NEGATIVE Final    Comment: (NOTE) SARS-CoV-2 target nucleic acids are NOT DETECTED.  The SARS-CoV-2 RNA is generally detectable in upper respiratory specimens during the acute phase of infection. The lowest concentration of SARS-CoV-2 viral copies this assay can detect is 138 copies/mL. A negative result does not preclude SARS-Cov-2 infection and should not be used as the sole basis for treatment or other patient management decisions. A negative result may occur with  improper specimen collection/handling, submission of  specimen other than nasopharyngeal swab, presence of viral mutation(s) within the areas targeted by this assay, and inadequate number of viral copies(<138 copies/mL). A negative result must be combined with clinical observations, patient history, and epidemiological information. The expected result is Negative.  Fact Sheet for Patients:  EntrepreneurPulse.com.au  Fact Sheet for Healthcare Providers:  IncredibleEmployment.be  This test is no t yet approved or cleared by the [place] FDA and  has been authorized for detection and/or diagnosis of SARS-CoV-2 by FDA under an Emergency Use Authorization (EUA). This EUA will remain  in effect (meaning this test can be used) for the duration of the COVID-19 declaration under Section 564(b)(1) of the Act, 21 U.S.C.section 360bbb-3(b)(1), unless the authorization is terminated  or revoked sooner.       Influenza A by PCR NEGATIVE NEGATIVE Final   Influenza B by PCR NEGATIVE NEGATIVE Final    Comment: (NOTE) The Xpert Xpress SARS-CoV-2/FLU/RSV plus assay is intended as an aid in the diagnosis of influenza from Nasopharyngeal swab specimens and should not be used as a sole basis for treatment. Nasal washings and aspirates are unacceptable for Xpert Xpress SARS-CoV-2/FLU/RSV testing.  Fact Sheet for Patients: EntrepreneurPulse.com.au  Fact Sheet for Healthcare Providers: IncredibleEmployment.be  This test is not yet approved or cleared by the [place] FDA and has been authorized for detection and/or diagnosis of SARS-CoV-2 by FDA under an Emergency Use Authorization (EUA). This EUA will remain in effect (meaning this test can be used) for the duration of the COVID-19 declaration under Section 564(b)(1) of the Act, 21 U.S.C. section 360bbb-3(b)(1), unless the authorization is terminated or revoked.  Performed at [facility], [place]  [address], [place], [place] [address]      Radiological Exams on Admission: CT Head Wo Contrast  Result Date: 01/27/2021 CLINICAL DATA:  Head trauma, minor. Additional provided: Fall at home hitting left head on banister. Laceration to left head. EXAM: CT HEAD WITHOUT CONTRAST TECHNIQUE: Contiguous axial images were obtained from the base of the skull through the vertex without intravenous contrast. COMPARISON:  No pertinent prior exams available for comparison. FINDINGS: Brain: Mild cerebral atrophy. There is no acute intracranial hemorrhage. No demarcated cortical infarct. No extra-axial fluid collection. No evidence of intracranial mass. No midline shift. Vascular: No hyperdense vessel.  Atherosclerotic calcifications Skull: Normal. Negative for fracture or focal  lesion. Sinuses/Orbits: Visualized orbits show no acute finding. Small left sphenoid sinus fluid level. IMPRESSION: No evidence of acute intracranial abnormality. Mild cerebral atrophy. Left sphenoid sinusitis. Electronically Signed   By: Kellie Simmering DO   On: 01/27/2021 18:26   DG Chest Port 1 View  Result Date: 01/27/2021 CLINICAL DATA:  Preop EXAM: PORTABLE CHEST 1 VIEW COMPARISON:  03/02/2014 FINDINGS: Mild peribronchial thickening. Heart and mediastinal contours are within normal limits. No focal opacities or effusions. No acute bony abnormality. IMPRESSION: Mild bronchitic changes. Electronically Signed   By: Rolm Baptise M.D.   On: 01/27/2021 18:17   DG Hip Unilat With Pelvis 2-3 Views Left  Result Date: 01/27/2021 CLINICAL DATA:  Left hip pain after a trip and fall today. Initial encounter. EXAM: DG HIP (WITH OR WITHOUT PELVIS) 2-3V LEFT COMPARISON:  None. FINDINGS: The patient has an acute subcapital fracture of the left hip. No other acute abnormality is identified. Lower lumbar spondylosis noted. IMPRESSION: Acute subcapital fracture left hip. Electronically Signed   By: Inge Rise M.D.   On: 01/27/2021 18:17    EKG:  Independently reviewed. Sinus rhythm with sinus arrhythmia.   Assessment/Plan   1. Left hip fracture  - Presents with left hip pain after a mechanical fall at home and is found to have subcapital hip fracture  - Orthopedic surgery consulting and much appreciated  - Based on the available data, Mrs. Draughn presents an estimated 0.25% risk of perioperative MI or cardiac arrest  - Continue pain-control, CMS checks, npo after midnight   2. Scalp laceration  - No underlying fracture or intracranial bleed on CT  - Repaired with staples in ED which should be removed in 7-10 days    DVT prophylaxis: SCDs  Code Status: Full  Level of Care: Level of care: Med-Surg Family Communication: Discussed with daughter at bedside Disposition Plan:  Patient is from: Home  Anticipated d/c is to: TBD Anticipated d/c date is: 01/30/21 Patient currently: Pending surgical eval, likely operative hip repair  Consults called: orthopedic surgery  Admission status: Inpatient     Vianne Bulls, MD [facility]  01/27/2021, 8:07 PM

## 2021-01-27 NOTE — ED Triage Notes (Signed)
Per EMS, patient from home, trip and fall on porch. L head hit bannister. Laceration to left head. Denies LOC and blood thinners. C/o L hip pain.   18g L FA 47mcg Fentanyl with EMS

## 2021-01-28 ENCOUNTER — Inpatient Hospital Stay (HOSPITAL_COMMUNITY): Payer: Medicare Other

## 2021-01-28 ENCOUNTER — Inpatient Hospital Stay (HOSPITAL_COMMUNITY): Payer: Medicare Other | Admitting: Certified Registered"

## 2021-01-28 ENCOUNTER — Encounter (HOSPITAL_COMMUNITY)
Admission: EM | Disposition: A | Discharge status: Home Health | Payer: Self-pay | Source: Home / Self Care | Attending: Family Medicine

## 2021-01-28 ENCOUNTER — Encounter (HOSPITAL_COMMUNITY): Payer: Self-pay | Admitting: Family Medicine

## 2021-01-28 DIAGNOSIS — E7800 Pure hypercholesterolemia, unspecified: Secondary | ICD-10-CM

## 2021-01-28 DIAGNOSIS — S72002A Fracture of unspecified part of neck of left femur, initial encounter for closed fracture: Secondary | ICD-10-CM | POA: Diagnosis not present

## 2021-01-28 DIAGNOSIS — M810 Age-related osteoporosis without current pathological fracture: Secondary | ICD-10-CM

## 2021-01-28 DIAGNOSIS — M1990 Unspecified osteoarthritis, unspecified site: Secondary | ICD-10-CM | POA: Diagnosis not present

## 2021-01-28 DIAGNOSIS — S0101XA Laceration without foreign body of scalp, initial encounter: Secondary | ICD-10-CM | POA: Diagnosis not present

## 2021-01-28 HISTORY — PX: HIP ARTHROPLASTY: SHX981

## 2021-01-28 LAB — CBC
HCT: 38 % (ref 36.0–46.0)
Hemoglobin: 12.3 g/dL (ref 12.0–15.0)
MCH: 30.5 pg (ref 26.0–34.0)
MCHC: 32.4 g/dL (ref 30.0–36.0)
MCV: 94.3 fL (ref 80.0–100.0)
Platelets: 228 10*3/uL (ref 150–400)
RBC: 4.03 MIL/uL (ref 3.87–5.11)
RDW: 13.2 % (ref 11.5–15.5)
WBC: 9.9 10*3/uL (ref 4.0–10.5)
nRBC: 0 % (ref 0.0–0.2)

## 2021-01-28 LAB — BASIC METABOLIC PANEL
Anion gap: 9 (ref 5–15)
BUN: 21 mg/dL (ref 8–23)
CO2: 20 mmol/L — ABNORMAL LOW (ref 22–32)
Calcium: 8.5 mg/dL — ABNORMAL LOW (ref 8.9–10.3)
Chloride: 105 mmol/L (ref 98–111)
Creatinine, Ser: 0.67 mg/dL (ref 0.44–1.00)
GFR, Estimated: >60 mL/min (ref >60)
Glucose, Bld: 130 mg/dL — ABNORMAL HIGH (ref 70–99)
Potassium: 4.3 mmol/L (ref 3.5–5.1)
Sodium: 134 mmol/L — ABNORMAL LOW (ref 135–145)

## 2021-01-28 LAB — TYPE AND SCREEN
ABO/RH(D): A POS
Antibody Screen: NEGATIVE

## 2021-01-28 LAB — SURGICAL PCR SCREEN
MRSA, PCR: NEGATIVE
Staphylococcus aureus: NEGATIVE

## 2021-01-28 LAB — ABO/RH: ABO/RH(D): A POS

## 2021-01-28 SURGERY — HEMIARTHROPLASTY, HIP, DIRECT ANTERIOR APPROACH, FOR FRACTURE
Anesthesia: General | Site: Hip | Laterality: Left

## 2021-01-28 MED ORDER — FENTANYL CITRATE (PF) 100 MCG/2ML IJ SOLN
INTRAMUSCULAR | Status: DC | PRN
  Administered 2021-01-28: 50 ug via INTRAVENOUS
  Administered 2021-01-28 (×2): 25 ug via INTRAVENOUS

## 2021-01-28 MED ORDER — SUGAMMADEX SODIUM 200 MG/2ML IV SOLN
INTRAVENOUS | Status: DC | PRN
  Administered 2021-01-28: 100 mg via INTRAVENOUS

## 2021-01-28 MED ORDER — OXYCODONE HCL 5 MG/5ML PO SOLN: 5.0000 mg | Freq: Once | ORAL | Status: DC | PRN

## 2021-01-28 MED ORDER — AMISULPRIDE (ANTIEMETIC) 5 MG/2ML IV SOLN
INTRAVENOUS | Status: AC
  Filled 2021-01-28: qty 4

## 2021-01-28 MED ORDER — METOCLOPRAMIDE HCL 5 MG/ML IJ SOLN: 5.0000 mg | Freq: Three times a day (TID) | INTRAMUSCULAR | Status: DC | PRN

## 2021-01-28 MED ORDER — ADULT MULTIVITAMIN W/MINERALS CH
1.0000 | ORAL_TABLET | Freq: Every day | ORAL | Status: DC
  Administered 2021-01-28 – 2021-01-30 (×3): 1 via ORAL
  Filled 2021-01-28 (×3): qty 1

## 2021-01-28 MED ORDER — PHENYLEPHRINE 40 MCG/ML (10ML) SYRINGE FOR IV PUSH (FOR BLOOD PRESSURE SUPPORT)
PREFILLED_SYRINGE | INTRAVENOUS | Status: DC | PRN
  Administered 2021-01-28: 80 ug via INTRAVENOUS

## 2021-01-28 MED ORDER — ONDANSETRON HCL 4 MG/2ML IJ SOLN: 4.0000 mg | Freq: Four times a day (QID) | INTRAMUSCULAR | Status: DC | PRN

## 2021-01-28 MED ORDER — LIDOCAINE 2% (20 MG/ML) 5 ML SYRINGE
INTRAMUSCULAR | Status: DC | PRN
  Administered 2021-01-28: 60 mg via INTRAVENOUS

## 2021-01-28 MED ORDER — METOCLOPRAMIDE HCL 5 MG PO TABS: 5.0000 mg | ORAL_TABLET | Freq: Three times a day (TID) | ORAL | Status: DC | PRN

## 2021-01-28 MED ORDER — MORPHINE SULFATE (PF) 2 MG/ML IV SOLN: 0.5000 mg | INTRAVENOUS | Status: DC | PRN

## 2021-01-28 MED ORDER — STERILE WATER FOR IRRIGATION IR SOLN
Status: DC | PRN
  Administered 2021-01-28: 2000 mL

## 2021-01-28 MED ORDER — ALUM & MAG HYDROXIDE-SIMETH 200-200-20 MG/5ML PO SUSP: 30.0000 mL | ORAL | Status: DC | PRN

## 2021-01-28 MED ORDER — CEFAZOLIN SODIUM-DEXTROSE 2-4 GM/100ML-% IV SOLN
INTRAVENOUS | Status: AC
  Filled 2021-01-28: qty 100

## 2021-01-28 MED ORDER — ONDANSETRON HCL 4 MG/2ML IJ SOLN
4.0000 mg | Freq: Four times a day (QID) | INTRAMUSCULAR | Status: AC | PRN
  Administered 2021-01-28: 4 mg via INTRAVENOUS

## 2021-01-28 MED ORDER — ONDANSETRON HCL 4 MG/2ML IJ SOLN
INTRAMUSCULAR | Status: DC | PRN
  Administered 2021-01-28: 4 mg via INTRAVENOUS

## 2021-01-28 MED ORDER — HYDROCODONE-ACETAMINOPHEN 5-325 MG PO TABS
1.0000 | ORAL_TABLET | ORAL | Status: DC | PRN
Start: 2021-01-28 — End: 2021-01-30

## 2021-01-28 MED ORDER — FENTANYL CITRATE (PF) 100 MCG/2ML IJ SOLN
INTRAMUSCULAR | Status: AC
  Filled 2021-01-28: qty 2

## 2021-01-28 MED ORDER — PHENYLEPHRINE 40 MCG/ML (10ML) SYRINGE FOR IV PUSH (FOR BLOOD PRESSURE SUPPORT)
PREFILLED_SYRINGE | INTRAVENOUS | Status: AC
  Filled 2021-01-28: qty 10

## 2021-01-28 MED ORDER — DOCUSATE SODIUM 100 MG PO CAPS
100.0000 mg | ORAL_CAPSULE | Freq: Two times a day (BID) | ORAL | Status: DC
  Administered 2021-01-29 – 2021-01-30 (×3): 100 mg via ORAL
  Filled 2021-01-28 (×3): qty 1

## 2021-01-28 MED ORDER — FENTANYL CITRATE (PF) 100 MCG/2ML IJ SOLN
25.0000 ug | INTRAMUSCULAR | Status: DC | PRN
  Administered 2021-01-28 (×2): 50 ug via INTRAVENOUS

## 2021-01-28 MED ORDER — SODIUM CHLORIDE 0.45 % IV SOLN: INTRAVENOUS | Status: DC

## 2021-01-28 MED ORDER — ROCURONIUM BROMIDE 10 MG/ML (PF) SYRINGE
PREFILLED_SYRINGE | INTRAVENOUS | Status: DC | PRN
  Administered 2021-01-28: 40 mg via INTRAVENOUS

## 2021-01-28 MED ORDER — PROPOFOL 10 MG/ML IV BOLUS
INTRAVENOUS | Status: AC
  Filled 2021-01-28: qty 20

## 2021-01-28 MED ORDER — PROPOFOL 10 MG/ML IV BOLUS
INTRAVENOUS | Status: DC | PRN
  Administered 2021-01-28: 120 mg via INTRAVENOUS

## 2021-01-28 MED ORDER — ONDANSETRON HCL 4 MG/2ML IJ SOLN
INTRAMUSCULAR | Status: AC
  Filled 2021-01-28: qty 2

## 2021-01-28 MED ORDER — ASPIRIN EC 325 MG PO TBEC
325.0000 mg | DELAYED_RELEASE_TABLET | Freq: Every day | ORAL | Status: DC
  Administered 2021-01-29 – 2021-01-30 (×2): 325 mg via ORAL
  Filled 2021-01-28 (×2): qty 1

## 2021-01-28 MED ORDER — LIDOCAINE 2% (20 MG/ML) 5 ML SYRINGE
INTRAMUSCULAR | Status: AC
  Filled 2021-01-28: qty 5

## 2021-01-28 MED ORDER — PANTOPRAZOLE SODIUM 40 MG PO TBEC: 40.0000 mg | DELAYED_RELEASE_TABLET | Freq: Every day | ORAL | Status: DC

## 2021-01-28 MED ORDER — EZETIMIBE 10 MG PO TABS
10.0000 mg | ORAL_TABLET | Freq: Every day | ORAL | Status: DC
  Administered 2021-01-28 – 2021-01-30 (×3): 10 mg via ORAL
  Filled 2021-01-28 (×3): qty 1

## 2021-01-28 MED ORDER — AMISULPRIDE (ANTIEMETIC) 5 MG/2ML IV SOLN
10.0000 mg | Freq: Once | INTRAVENOUS | Status: AC
  Administered 2021-01-28: 10 mg via INTRAVENOUS

## 2021-01-28 MED ORDER — 0.9 % SODIUM CHLORIDE (POUR BTL) OPTIME
TOPICAL | Status: DC | PRN
  Administered 2021-01-28: 1000 mL

## 2021-01-28 MED ORDER — MENTHOL 3 MG MT LOZG: 1.0000 | LOZENGE | OROMUCOSAL | Status: DC | PRN

## 2021-01-28 MED ORDER — ONDANSETRON HCL 4 MG PO TABS: 4.0000 mg | ORAL_TABLET | Freq: Four times a day (QID) | ORAL | Status: DC | PRN

## 2021-01-28 MED ORDER — ROCURONIUM BROMIDE 10 MG/ML (PF) SYRINGE
PREFILLED_SYRINGE | INTRAVENOUS | Status: AC
  Filled 2021-01-28: qty 10

## 2021-01-28 MED ORDER — DEXAMETHASONE SODIUM PHOSPHATE 10 MG/ML IJ SOLN
INTRAMUSCULAR | Status: AC
  Filled 2021-01-28: qty 1

## 2021-01-28 MED ORDER — ACETAMINOPHEN 325 MG PO TABS: 325.0000 mg | ORAL_TABLET | Freq: Four times a day (QID) | ORAL | Status: DC | PRN

## 2021-01-28 MED ORDER — ENSURE ENLIVE PO LIQD: 237.0000 mL | Freq: Two times a day (BID) | ORAL | Status: DC

## 2021-01-28 MED ORDER — HYDROCODONE-ACETAMINOPHEN 7.5-325 MG PO TABS: 1.0000 | ORAL_TABLET | ORAL | Status: DC | PRN

## 2021-01-28 MED ORDER — LACTATED RINGERS IV SOLN: INTRAVENOUS | Status: DC | PRN

## 2021-01-28 MED ORDER — DEXAMETHASONE SODIUM PHOSPHATE 10 MG/ML IJ SOLN
INTRAMUSCULAR | Status: DC | PRN
  Administered 2021-01-28: 10 mg via INTRAVENOUS

## 2021-01-28 MED ORDER — BUPIVACAINE HCL (PF) 0.5 % IJ SOLN
INTRAMUSCULAR | Status: AC
  Filled 2021-01-28: qty 30

## 2021-01-28 MED ORDER — PHENOL 1.4 % MT LIQD: 1.0000 | OROMUCOSAL | Status: DC | PRN

## 2021-01-28 MED ORDER — ACETAMINOPHEN 500 MG PO TABS
500.0000 mg | ORAL_TABLET | Freq: Four times a day (QID) | ORAL | Status: AC
  Administered 2021-01-28 – 2021-01-29 (×3): 500 mg via ORAL
  Filled 2021-01-28 (×3): qty 1

## 2021-01-28 MED ORDER — CHLORHEXIDINE GLUCONATE CLOTH 2 % EX PADS: 6.0000 | MEDICATED_PAD | Freq: Every day | CUTANEOUS | Status: DC

## 2021-01-28 MED ORDER — OXYCODONE HCL 5 MG PO TABS: 5.0000 mg | ORAL_TABLET | Freq: Once | ORAL | Status: DC | PRN

## 2021-01-28 MED ORDER — BUPIVACAINE HCL (PF) 0.5 % IJ SOLN
INTRAMUSCULAR | Status: DC | PRN
  Administered 2021-01-28: 10 mL

## 2021-01-28 SURGICAL SUPPLY — 63 items
BIPOLAR DEPUY 47 (Hips) ×2 IMPLANT
BLADE SAW SAG 73X25 THK (BLADE) ×1
BLADE SAW SGTL 73X25 THK (BLADE) ×1 IMPLANT
BRUSH FEMORAL CANAL (MISCELLANEOUS) ×1 IMPLANT
CEMENT BONE SIMPLEX SPEEDSET (Cement) ×1 IMPLANT
CEMENT RESTRICTOR DEPUY SZ 4 (Cement) ×1 IMPLANT
CEMENTRALIZER 10.0MM (Orthopedic Implant) ×1 IMPLANT
CHLORAPREP W/TINT 26 (MISCELLANEOUS) ×2 IMPLANT
COVER SURGICAL LIGHT HANDLE (MISCELLANEOUS) ×2 IMPLANT
COVER WAND RF STERILE (DRAPES) IMPLANT
DRAPE INCISE IOBAN 66X45 STRL (DRAPES) ×2 IMPLANT
DRAPE ORTHO SPLIT 77X108 STRL (DRAPES) ×4
DRAPE POUCH INSTRU U-SHP 10X18 (DRAPES) ×2 IMPLANT
DRAPE SURG ORHT 6 SPLT 77X108 (DRAPES) ×2 IMPLANT
DRAPE U-SHAPE 47X51 STRL (DRAPES) ×2 IMPLANT
DRAPE WARM FLUID 44X44 (DRAPES) ×2 IMPLANT
DRSG PAD ABDOMINAL 8X10 ST (GAUZE/BANDAGES/DRESSINGS) ×1 IMPLANT
ELECT BLADE TIP CTD 4 INCH (ELECTRODE) ×2 IMPLANT
ELECT REM PT RETURN 15FT ADLT (MISCELLANEOUS) ×2 IMPLANT
EVACUATOR 1/8 PVC DRAIN (DRAIN) ×2 IMPLANT
FACESHIELD WRAPAROUND (MASK) ×10 IMPLANT
FACESHIELD WRAPAROUND OR TEAM (MASK) ×5 IMPLANT
GAUZE SPONGE 4X4 12PLY STRL (GAUZE/BANDAGES/DRESSINGS) ×3 IMPLANT
GAUZE XEROFORM 5X9 LF (GAUZE/BANDAGES/DRESSINGS) ×2 IMPLANT
GLOVE ORTHO TXT STRL SZ7.5 (GLOVE) ×2 IMPLANT
GLOVE SRG 8 PF TXTR STRL LF DI (GLOVE) ×1 IMPLANT
GLOVE SURG UNDER POLY LF SZ8 (GLOVE) ×2
GOWN STRL REUS W/TWL LRG LVL3 (GOWN DISPOSABLE) ×2 IMPLANT
HANDPIECE INTERPULSE COAX TIP (DISPOSABLE) ×2
HEAD BIPOLAR DEPUY 47 (Hips) IMPLANT
HEAD FEM STD 28X+1.5 STRL (Hips) ×1 IMPLANT
IMMOBILIZER KNEE 20 (SOFTGOODS) ×2 IMPLANT
IMMOBILIZER KNEE 20 THIGH 36 (SOFTGOODS) ×1 IMPLANT
KIT BASIN OR (CUSTOM PROCEDURE TRAY) ×2 IMPLANT
KIT TURNOVER KIT A (KITS) ×2 IMPLANT
NDL MAYO CATGUT SZ4 TPR NDL (NEEDLE) ×1 IMPLANT
NEEDLE HYPO 22GX1.5 SAFETY (NEEDLE) IMPLANT
NEEDLE MAYO CATGUT SZ4 (NEEDLE) ×2 IMPLANT
NS IRRIG 1000ML POUR BTL (IV SOLUTION) ×4 IMPLANT
PACK TOTAL JOINT (CUSTOM PROCEDURE TRAY) ×2 IMPLANT
PASSER SUT SWANSON 36MM LOOP (INSTRUMENTS) ×2 IMPLANT
PENCIL SMOKE EVACUATOR (MISCELLANEOUS) IMPLANT
PROTECTOR NERVE ULNAR (MISCELLANEOUS) ×2 IMPLANT
SET HNDPC FAN SPRY TIP SCT (DISPOSABLE) IMPLANT
SPONGE LAP 18X18 RF (DISPOSABLE) IMPLANT
SPONGE LAP 4X18 RFD (DISPOSABLE) ×1 IMPLANT
STAPLER VISISTAT 35W (STAPLE) ×2 IMPLANT
STEM SUMMIT CEMENT BASIC SZ4 (Hips) ×1 IMPLANT
SUCTION FRAZIER HANDLE 12FR (TUBING) ×2
SUCTION TUBE FRAZIER 12FR DISP (TUBING) ×1 IMPLANT
SUT ETHIBOND NAB CT1 #1 30IN (SUTURE) ×8 IMPLANT
SUT VIC AB 0 CT1 27 (SUTURE) ×2
SUT VIC AB 0 CT1 27XBRD ANTBC (SUTURE) ×1 IMPLANT
SUT VIC AB 1 CT1 27 (SUTURE)
SUT VIC AB 1 CT1 27XBRD ANTBC (SUTURE) IMPLANT
SUT VIC AB 1 CTX 36 (SUTURE)
SUT VIC AB 1 CTX36XBRD ANBCTR (SUTURE) IMPLANT
SUT VIC AB 2-0 CT1 36 (SUTURE) ×6 IMPLANT
SYR 30ML LL (SYRINGE) IMPLANT
TOWEL OR 17X26 10 PK STRL BLUE (TOWEL DISPOSABLE) ×4 IMPLANT
TOWER CARTRIDGE SMART MIX (DISPOSABLE) IMPLANT
TRAY FOLEY MTR SLVR 16FR STAT (SET/KITS/TRAYS/PACK) ×2 IMPLANT
WATER STERILE IRR 1000ML POUR (IV SOLUTION) ×2 IMPLANT

## 2021-01-28 NOTE — Anesthesia Preprocedure Evaluation (Signed)
Anesthesia Evaluation  Patient identified by MRN, date of birth, ID band Patient awake    Reviewed: Allergy & Precautions, H&P , NPO status , Patient's Chart, lab work & pertinent test results  Airway Mallampati: II   Neck ROM: full    Dental   Pulmonary former smoker,    breath sounds clear to auscultation       Cardiovascular negative cardio ROS   Rhythm:regular Rate:Normal     Neuro/Psych    GI/Hepatic   Endo/Other    Renal/GU      Musculoskeletal  (+) Arthritis , H/o lumbar laminectomy Hip fx   Abdominal   Peds  Hematology   Anesthesia Other Findings   Reproductive/Obstetrics                             Anesthesia Physical Anesthesia Plan  ASA: II  Anesthesia Plan: General   Post-op Pain Management:    Induction: Intravenous  PONV Risk Score and Plan: 3 and Ondansetron, Dexamethasone and Treatment may vary due to age or medical condition  Airway Management Planned: Oral ETT  Additional Equipment:   Intra-op Plan:   Post-operative Plan: Extubation in OR  Informed Consent: I have reviewed the patients History and Physical, chart, labs and discussed the procedure including the risks, benefits and alternatives for the proposed anesthesia with the patient or authorized representative who has indicated his/her understanding and acceptance.     Dental advisory given  Plan Discussed with: CRNA, Anesthesiologist and Surgeon  Anesthesia Plan Comments:         Anesthesia Quick Evaluation

## 2021-01-28 NOTE — Transfer of Care (Signed)
Immediate Anesthesia Transfer of Care Note  Patient: [name]  Procedure(s) Performed: ARTHROPLASTY BIPOLAR HIP (HEMIARTHROPLASTY) (Left Hip)  Patient Location: PACU  Anesthesia Type:General  Level of Consciousness: awake, alert  and oriented  Airway & Oxygen Therapy: Patient Spontanous Breathing and Patient connected to face mask oxygen  Post-op Assessment: Report given to RN and Post -op Vital signs reviewed and stable  Post vital signs: Reviewed and stable  Last Vitals:  Vitals Value Taken Time  BP    Temp    Pulse    Resp    SpO2      Last Pain:  Vitals:   01/28/21 0135  TempSrc: Oral  PainSc:       Patients Stated Pain Goal: 5 (39/35/94 0905)  Complications: No complications documented.

## 2021-01-28 NOTE — Interval H&P Note (Signed)
History and Physical Interval Note:  01/28/2021 7:28 AM  [name]  has presented today for surgery, with the diagnosis of Left displaced femoral neck fracture.  The various methods of treatment have been discussed with the patient and family. After consideration of risks, benefits and other options for treatment, the patient has consented to  Procedure(s) with comments: ARTHROPLASTY BIPOLAR HIP (HEMIARTHROPLASTY) (Left) - depuy, Lateral position with Elta [place] II as a surgical intervention.  The patient's history has been reviewed, patient examined, no change in status, stable for surgery.  I have reviewed the patient's chart and labs.  Questions were answered to the patient's satisfaction.     [name]

## 2021-01-28 NOTE — Progress Notes (Signed)
Initial Nutrition Assessment  DOCUMENTATION CODES:   Not applicable  INTERVENTION:   - Ensure Enlive po BID, each supplement provides 350 kcal and 20 grams of protein  - MVI with minerals daily  NUTRITION DIAGNOSIS:   Increased nutrient needs related to hip fracture,post-op healing as evidenced by estimated needs.  GOAL:   Patient will meet greater than or equal to 90% of their needs  MONITOR:   PO intake,Supplement acceptance,Weight trends  REASON FOR ASSESSMENT:   Consult Hip fracture protocol  ASSESSMENT:   87 year old female who presented to the ED on 3/19 after a mechanical fall. PMH of HLD, osteoporosis. Pt found to have a left hip fracture.   3/20 - s/p L hemiarthroplasty  RD attempted to reach pt via phone call to room; however, no answer. RD will attempt to obtain diet and weight history at follow-up. No meal completion data documented at this time.  Per H&P, pt exercises regularly and often walks 5 miles at a time. No information available regarding PO intake.  Reviewed available weight history in chart. Noted pt with a 3.4 kg weight loss since 02/01/20. This is a 6.9% weight loss in 1 year which is not significant for timeframe.  RD will order oral nutrition supplements to aid pt in meeting increased kcal and protein needs related to hip fracture and post-op healing. Will also order daily MVI.  Medications reviewed and include: colace IVF: NS @ 75 ml/hr  Labs reviewed: sodium 134  NUTRITION - FOCUSED PHYSICAL EXAM:  Unable to complete at this time. RD working remotely.  Diet Order:   Diet Order            Diet regular Room service appropriate? Yes; Fluid consistency: Thin  Diet effective now                 EDUCATION NEEDS:   No education needs have been identified at this time  Skin:  Skin Assessment: Skin Integrity Issues: Incisions: left hip Other: laceration to head  Last BM:  01/27/21  Height:   Ht Readings from Last 1  Encounters:  01/27/21 4\' 11"  (1.499 m)    Weight:   Wt Readings from Last 1 Encounters:  01/27/21 45.6 kg    BMI:  Body mass index is 20.3 kg/m.  Estimated Nutritional Needs:   Kcal:  1400-1600  Protein:  65-80 grams  Fluid:  1.4-1.6 L    Gustavus Bryant, MS, RD, LDN Inpatient Clinical Dietitian Please see AMiON for contact information.

## 2021-01-28 NOTE — Plan of Care (Signed)
  Problem: Health Behavior/Discharge Planning: Goal: Ability to manage health-related needs will improve Outcome: Progressing   Problem: Clinical Measurements: Goal: Ability to maintain clinical measurements within normal limits will improve Outcome: Progressing Goal: Cardiovascular complication will be avoided Outcome: Progressing   Problem: Nutrition: Goal: Adequate nutrition will be maintained Outcome: Progressing   Problem: Pain Managment: Goal: General experience of comfort will improve Outcome: Progressing

## 2021-01-28 NOTE — Plan of Care (Signed)
  Problem: Education: Goal: Knowledge of General Education information will improve Description: Including pain rating scale, medication(s)/side effects and non-pharmacologic comfort measures Outcome: Progressing   Problem: Safety: Goal: Ability to remain free from injury will improve Outcome: Progressing   

## 2021-01-28 NOTE — Progress Notes (Signed)
PROGRESS NOTE    [name]  BSW:[id number] DOB: 1933/05/02 DOA: 01/27/2021 PCP: Crist Infante, MD   Brief Narrative: [name] is a 87 y.o. female with a history of hyperlipidemia and osteoporosis. Patient presented secondary to left hi pain after having a fall. Associated scalp laceration requiring staple repair in the ED. Orthopedic surgery consulted for management of hip fracture.   Assessment & Plan:   Principal Problem:   Closed left hip fracture, initial encounter [facility]) Active Problems:   HYPERCHOLESTEROLEMIA   Osteoporosis   Scalp laceration   Left hip fracture Secondary to fall. No LOC. In setting of osteoporosis history. Orthopedic surgery consulted on admission. -Orthopedic surgery recommendations: surgery today -Analgesics prn; per orthopedic surgery post-op  Scalp laceration CT head without associated intracranial bleed. Laceration repaired in the ED with staples with recommendation for removal 7-10 days post-repair  Hyperlipidemia -Resume Zetia  DVT prophylaxis: Per orthopedic surgery Code Status:   Code Status: Full Code Family Communication: None at bedside Disposition Plan: Discharge home vs SNF likely in 2-3 days pending orthopedic surgery, PT, OT recommendations   Consultants:   Orthopedic surgery  Procedures:   None  Antimicrobials:  None    Subjective: Significant leg hip pain. Analgesics helped a little but pain is exacerbated with movement when she sleeps.  Objective: Vitals:   01/27/21 2124 01/27/21 2132 01/28/21 0135 01/28/21 0540  BP: (!) 142/71  (!) 141/68 (!) 142/65  Pulse: 71  73 75  Resp: 12  12 17   Temp: 98.5 F (36.9 C)  98.3 F (36.8 C) 98.2 F (36.8 C)  TempSrc: Oral  Oral   SpO2: 99%  97% 97%  Weight:  45.6 kg    Height:  4\' 11"  (1.499 m)      Intake/Output Summary (Last 24 hours) at 01/28/2021 0735 Last data filed at 01/28/2021 0600 Gross per 24 hour  Intake 736.16 ml  Output --  Net 736.16 ml    Filed Weights   01/27/21 2132  Weight: 45.6 kg    Examination:  General exam: Appears calm and comfortable Respiratory system: Clear to auscultation. Respiratory effort normal. Cardiovascular system: S1 & S2 heard, RRR. No murmurs, rubs, gallops or clicks. Gastrointestinal system: Abdomen is nondistended, soft and nontender. No organomegaly or masses felt. Normal bowel sounds heard. Central nervous system: Alert and oriented. No focal neurological deficits. Musculoskeletal: No edema. No calf tenderness Skin: No cyanosis. No rashes Psychiatry: Judgement and insight appear normal. Mood & affect appropriate.     Data Reviewed: I have personally reviewed following labs and imaging studies  CBC Lab Results  Component Value Date   WBC 9.9 01/28/2021   RBC 4.03 01/28/2021   HGB 12.3 01/28/2021   HCT 38.0 01/28/2021   MCV 94.3 01/28/2021   MCH 30.5 01/28/2021   PLT 228 01/28/2021   MCHC 32.4 01/28/2021   RDW 13.2 01/28/2021   LYMPHSABS 1.3 01/27/2021   MONOABS 0.8 01/27/2021   EOSABS 0.0 01/27/2021   BASOSABS 0.0 46/65/9935     Last metabolic panel Lab Results  Component Value Date   NA 134 (L) 01/28/2021   K 4.3 01/28/2021   CL 105 01/28/2021   CO2 20 (L) 01/28/2021   BUN 21 01/28/2021   CREATININE 0.67 01/28/2021   GLUCOSE 130 (H) 01/28/2021   GFRNONAA >60 01/28/2021   GFRAA 73 06/22/2018   CALCIUM 8.5 (L) 01/28/2021   PHOS 3.9 03/15/2010   PROT 6.3 09/28/2018   ALBUMIN 4.0 09/28/2018  LABGLOB 2.5 06/22/2018   AGRATIO 1.7 06/22/2018   BILITOT 0.3 09/28/2018   ALKPHOS 65 09/28/2018   AST 20 09/28/2018   ALT 17 09/28/2018   ANIONGAP 9 01/28/2021    CBG (last 3)  No results for input(s): GLUCAP in the last 72 hours.   GFR: Estimated Creatinine Clearance: 34.4 mL/min (by C-G formula based on SCr of 0.67 mg/dL).  Coagulation Profile: No results for input(s): INR, PROTIME in the last 168 hours.  Recent Results (from the past 240 hour(s))  Resp Panel  by RT-PCR (Flu A&B, Covid) Nasopharyngeal Swab     Status: None   Collection Time: 01/27/21  5:40 PM   Specimen: Nasopharyngeal Swab; Nasopharyngeal(NP) swabs in vial transport medium  Result Value Ref Range Status   SARS Coronavirus 2 by RT PCR NEGATIVE NEGATIVE Final    Comment: (NOTE) SARS-CoV-2 target nucleic acids are NOT DETECTED.  The SARS-CoV-2 RNA is generally detectable in upper respiratory specimens during the acute phase of infection. The lowest concentration of SARS-CoV-2 viral copies this assay can detect is 138 copies/mL. A negative result does not preclude SARS-Cov-2 infection and should not be used as the sole basis for treatment or other patient management decisions. A negative result may occur with  improper specimen collection/handling, submission of specimen other than nasopharyngeal swab, presence of viral mutation(s) within the areas targeted by this assay, and inadequate number of viral copies(<138 copies/mL). A negative result must be combined with clinical observations, patient history, and epidemiological information. The expected result is Negative.  Fact Sheet for Patients:  EntrepreneurPulse.com.au  Fact Sheet for Healthcare Providers:  IncredibleEmployment.be  This test is no t yet approved or cleared by the [place] FDA and  has been authorized for detection and/or diagnosis of SARS-CoV-2 by FDA under an Emergency Use Authorization (EUA). This EUA will remain  in effect (meaning this test can be used) for the duration of the COVID-19 declaration under Section 564(b)(1) of the Act, 21 U.S.C.section 360bbb-3(b)(1), unless the authorization is terminated  or revoked sooner.       Influenza A by PCR NEGATIVE NEGATIVE Final   Influenza B by PCR NEGATIVE NEGATIVE Final    Comment: (NOTE) The Xpert Xpress SARS-CoV-2/FLU/RSV plus assay is intended as an aid in the diagnosis of influenza from Nasopharyngeal swab  specimens and should not be used as a sole basis for treatment. Nasal washings and aspirates are unacceptable for Xpert Xpress SARS-CoV-2/FLU/RSV testing.  Fact Sheet for Patients: EntrepreneurPulse.com.au  Fact Sheet for Healthcare Providers: IncredibleEmployment.be  This test is not yet approved or cleared by the [place] FDA and has been authorized for detection and/or diagnosis of SARS-CoV-2 by FDA under an Emergency Use Authorization (EUA). This EUA will remain in effect (meaning this test can be used) for the duration of the COVID-19 declaration under Section 564(b)(1) of the Act, 21 U.S.C. section 360bbb-3(b)(1), unless the authorization is terminated or revoked.  Performed at [facility], [place] [address] [place], [place] [address]   Surgical pcr screen     Status: None   Collection Time: 01/27/21 10:57 PM   Specimen: Nasal Mucosa; Nasal Swab  Result Value Ref Range Status   MRSA, PCR NEGATIVE NEGATIVE Final   Staphylococcus aureus NEGATIVE NEGATIVE Final    Comment: (NOTE) The Xpert SA Assay (FDA approved for NASAL specimens in patients 17 years of age and older), is one component of a comprehensive surveillance program. It is not intended to diagnose infection nor to guide  or monitor treatment. Performed at [facility], [place] [address], [place], [place] [address]         Radiology Studies: CT Head Wo Contrast  Result Date: 01/27/2021 CLINICAL DATA:  Head trauma, minor. Additional provided: Fall at home hitting left head on banister. Laceration to left head. EXAM: CT HEAD WITHOUT CONTRAST TECHNIQUE: Contiguous axial images were obtained from the base of the skull through the vertex without intravenous contrast. COMPARISON:  No pertinent prior exams available for comparison. FINDINGS: Brain: Mild cerebral atrophy. There is no acute intracranial hemorrhage. No demarcated cortical infarct.  No extra-axial fluid collection. No evidence of intracranial mass. No midline shift. Vascular: No hyperdense vessel.  Atherosclerotic calcifications Skull: Normal. Negative for fracture or focal lesion. Sinuses/Orbits: Visualized orbits show no acute finding. Small left sphenoid sinus fluid level. IMPRESSION: No evidence of acute intracranial abnormality. Mild cerebral atrophy. Left sphenoid sinusitis. Electronically Signed   By: Kellie Simmering DO   On: 01/27/2021 18:26   DG Chest Port 1 View  Result Date: 01/27/2021 CLINICAL DATA:  Preop EXAM: PORTABLE CHEST 1 VIEW COMPARISON:  03/02/2014 FINDINGS: Mild peribronchial thickening. Heart and mediastinal contours are within normal limits. No focal opacities or effusions. No acute bony abnormality. IMPRESSION: Mild bronchitic changes. Electronically Signed   By: Rolm Baptise M.D.   On: 01/27/2021 18:17   DG Hip Unilat With Pelvis 2-3 Views Left  Result Date: 01/27/2021 CLINICAL DATA:  Left hip pain after a trip and fall today. Initial encounter. EXAM: DG HIP (WITH OR WITHOUT PELVIS) 2-3V LEFT COMPARISON:  None. FINDINGS: The patient has an acute subcapital fracture of the left hip. No other acute abnormality is identified. Lower lumbar spondylosis noted. IMPRESSION: Acute subcapital fracture left hip. Electronically Signed   By: Inge Rise M.D.   On: 01/27/2021 18:17        Scheduled Meds: . chlorhexidine  60 mL Topical Once  . povidone-iodine  2 application Topical Once   Continuous Infusions: . sodium chloride 90 mL/hr at 01/28/21 0600  . ceFAZolin    .  ceFAZolin (ANCEF) IV    . tranexamic acid       LOS: 1 day     Cordelia Poche, MD [facility] 01/28/2021, 7:35 AM  If 7PM-7AM, please contact night-coverage [email or web address]

## 2021-01-28 NOTE — Anesthesia Postprocedure Evaluation (Signed)
Anesthesia Post Note  Patient: Sherolyn Buba  Procedure(s) Performed: ARTHROPLASTY BIPOLAR HIP (HEMIARTHROPLASTY) (Left Hip)     Patient location during evaluation: PACU Anesthesia Type: General Level of consciousness: awake and alert Pain management: pain level controlled Vital Signs Assessment: post-procedure vital signs reviewed and stable Respiratory status: spontaneous breathing, nonlabored ventilation, respiratory function stable and patient connected to nasal cannula oxygen Cardiovascular status: blood pressure returned to baseline and stable Postop Assessment: no apparent nausea or vomiting Anesthetic complications: no   No complications documented.  Last Vitals:  Vitals:   01/28/21 1059 01/28/21 1330  BP: 140/66 126/63  Pulse: 78 76  Resp: 14 15  Temp: 36.4 C 36.9 C  SpO2: 95% 99%    Last Pain:  Vitals:   01/28/21 1100  TempSrc:   PainSc: 5                  HODIERNE,ADAM S

## 2021-01-28 NOTE — Anesthesia Procedure Notes (Signed)
Procedure Name: Intubation Date/Time: 01/28/2021 7:42 AM Performed by: Ronson Hagins D, CRNA Pre-anesthesia Checklist: Patient identified, Emergency Drugs available, Suction available and Patient being monitored Patient Re-evaluated:Patient Re-evaluated prior to induction Oxygen Delivery Method: Circle system utilized Preoxygenation: Pre-oxygenation with 100% oxygen Induction Type: IV induction Ventilation: Mask ventilation without difficulty Laryngoscope Size: Mac and 3 Grade View: Grade I Tube type: Oral Tube size: 7.0 mm Number of attempts: 1 Airway Equipment and Method: Stylet Placement Confirmation: ETT inserted through vocal cords under direct vision,  positive ETCO2 and breath sounds checked- equal and bilateral Secured at: 21 cm Tube secured with: Tape Dental Injury: Teeth and Oropharynx as per pre-operative assessment

## 2021-01-28 NOTE — Op Note (Signed)
Preop diagnosis: Left closed displaced femoral neck fracture  Postoperative diagnosis: Same  Procedure: Cemented left hip bipolar hemiarthroplasty for femoral neck fracture  Surgeon: Rodell Perna, MD  Assistant: RNFA  Anesthesia G OT plus Marcaine local.  EBL: Approximately 200 cc  ImplantsCEMENT BONE SIMPLEX SPEEDSET - [id number]  Inventory Item: CEMENT BONE SIMPLEX SPEEDSET Serial no.:  Model/Cat no.: [id number]  Implant name: CEMENT BONE SIMPLEX SPEEDSET - [id number] Laterality: Left Area: Hip  Manufacturer: [employer] Date of Manufacture:    Action: Implanted Number Used: 1   Device Identifier:  Device Identifier Type:     CEMENTRALIZER 10.0MM - [id number]  Inventory Item: CEMENTRALIZER 10.0MM Serial no.:  Model/Cat no.: [id number]  Implant name: CEMENTRALIZER 10.0MM - [id number] Laterality: Left Area: Hip  Manufacturer: [place] Date of Manufacture:    Action: Implanted Number Used: 1   Device Identifier:  Device Identifier Type:     STEM SUMMIT CEMENT BASIC SZ4 - [id number]  Inventory Item: STEM SUMMIT CEMENT BASIC SZ4 Serial no.:  Model/Cat no.: [id number]  Implant name: STEM SUMMIT CEMENT BASIC SZ4 - [id number] Laterality: Left Area: Hip  Manufacturer: [place] Date of Manufacture:    Action: Implanted Number Used: 1   Device Identifier:  Device Identifier Type:     CEMENT RESTRICTOR DEPUY SZ 4 - [id number]  Inventory Item: CEMENT RESTRICTOR DEPUY SZ 4 Serial no.:  Model/Cat no.: [id number]  Implant name: CEMENT RESTRICTOR DEPUY SZ 4 - [id number] Laterality: Left Area: Hip  Manufacturer: [place] Date of Manufacture:    Action: Implanted Number Used: 1   Device Identifier:  Device Identifier TypeKenton Kingfisher 47MM - [id number]  Inventory Item: BIPOLAR DEPUY 47MM Serial no.:  Model/Cat no.: [id number]  Implant name: Kenton Kingfisher 47MM - [id number] Laterality: Left Area: Hip  Manufacturer: [place] Date of Manufacture:     Action: Implanted Number Used: 1   Device Identifier:  Device Identifier Type:     HIP BALL ARTICU DEPUY - [id number]  Inventory Item: HIP BALL ARTICU DEPUY Serial no.:  Model/Cat no.: [id number]  Implant name: HIP BALL ARTICU DEPUY - [id number] Laterality: Left Area: Hip  Manufacturer: [place] Date of Manufacture:    Action: Implanted Number Used: 1   Device Identifier:  Device Identifier Type:       Procedure: After standard prepping and draping after patient been intubated Ancef prophylaxis lateral position axillary roll marked to frame securement prepping from the mid calf up to the waist with DuraPrep the usual total that sheets drapes were applied and while DuraPrep was drying before draping timeout procedure was completed.  Impervious stockinette Coban sterile skin marker Betadine Steri-Drape x2 was used to seal the skin.  After timeout procedure incision was made with posterior approach.  Charnley retractor was placed gluteus maximus was split in line with the fibers piriformis tagged cut posterior capsule opened neck was cut initially slightly more than 1 finger breath and then a second cut was made taking 2 more millimeters off so that we had 1 fingerbreadth above the lesser trochanter.  Canal preparation was used with the canal finder lateralizer cookie cutter trochanteric reamer and then sequential broaching.  A DePuy [place] basic size 4 stem was selected based on good fit.  A size 4 cement restrictor was placed appropriate depth 2 cm below the tip of the stem with centralizer 10 mm in place.  Packing mixing with cement placement with a glue gun impaction placed on the stem appropriate version.  Next trial bipolars were tried after cement was hardened 15 minutes.  Hip was reduced with +1.5 neck with good stability flexion to 90 degrees internal rotation 80 degrees hip was stable.  A 47 ball was selected and had good suction fit based on trials.  Permanent bipolar was assembled  trunnion was cleaned snapped on sciatic nerve protected once again hip was reduced by me and then identical findings good stability.  Piriformis repair to gluteus medius with a free needle with a #1 Ethibond.  Gluteus maximus repair tensor fascia with #1 Ethibond 2-0 Vicryl subtenons tissue skin staple closure Marcaine infiltration postop dressing short knee immobilizer and transferred recovery in stable condition.

## 2021-01-29 ENCOUNTER — Encounter (HOSPITAL_COMMUNITY): Payer: Self-pay | Admitting: Orthopaedic Surgery

## 2021-01-29 LAB — CBC
HCT: 30.7 % — ABNORMAL LOW (ref 36.0–46.0)
Hemoglobin: 9.8 g/dL — ABNORMAL LOW (ref 12.0–15.0)
MCH: 30.4 pg (ref 26.0–34.0)
MCHC: 31.9 g/dL (ref 30.0–36.0)
MCV: 95.3 fL (ref 80.0–100.0)
Platelets: 177 10*3/uL (ref 150–400)
RBC: 3.22 MIL/uL — ABNORMAL LOW (ref 3.87–5.11)
RDW: 13.4 % (ref 11.5–15.5)
WBC: 9.1 10*3/uL (ref 4.0–10.5)
nRBC: 0 % (ref 0.0–0.2)

## 2021-01-29 LAB — BASIC METABOLIC PANEL
Anion gap: 4 — ABNORMAL LOW (ref 5–15)
BUN: 15 mg/dL (ref 8–23)
CO2: 24 mmol/L (ref 22–32)
Calcium: 7.8 mg/dL — ABNORMAL LOW (ref 8.9–10.3)
Chloride: 105 mmol/L (ref 98–111)
Creatinine, Ser: 0.68 mg/dL (ref 0.44–1.00)
GFR, Estimated: >60 mL/min (ref >60)
Glucose, Bld: 117 mg/dL — ABNORMAL HIGH (ref 70–99)
Potassium: 4.2 mmol/L (ref 3.5–5.1)
Sodium: 133 mmol/L — ABNORMAL LOW (ref 135–145)

## 2021-01-29 MED ORDER — ASPIRIN 325 MG PO TABS: 325.0000 mg | ORAL_TABLET | Freq: Every day | ORAL | Status: DC

## 2021-01-29 MED ORDER — HYDROCODONE-ACETAMINOPHEN 5-325 MG PO TABS: 1.0000 | ORAL_TABLET | Freq: Four times a day (QID) | ORAL | 0 refills | Status: DC | PRN

## 2021-01-29 NOTE — Progress Notes (Addendum)
   Subjective: 1 Day Post-Op Procedure(s) (LRB): ARTHROPLASTY BIPOLAR HIP (HEMIARTHROPLASTY) (Left) Patient reports pain as mild.  " I want to get up and walk, can I do therapy this morning?"  Objective: Vital signs in last 24 hours: Temp:  [97.6 F (36.4 C)-98.4 F (36.9 C)] 97.8 F (36.6 C) (03/21 0457) Pulse Rate:  [74-92] 77 (03/21 0457) Resp:  [11-17] 16 (03/21 0457) BP: (111-152)/(59-111) 114/67 (03/21 0457) SpO2:  [95 %-100 %] 100 % (03/21 0457)  Intake/Output from previous day: 03/20 0701 - 03/21 0700 In: 2333.9 [I.V.:2333.9] Out: 2050 [Urine:1850; Blood:200] Intake/Output this shift: No intake/output data recorded.  Recent Labs    01/27/21 1827 01/28/21 0235 01/29/21 0243  HGB 12.6 12.3 9.8*   Recent Labs    01/28/21 0235 01/29/21 0243  WBC 9.9 9.1  RBC 4.03 3.22*  HCT 38.0 30.7*  PLT 228 177   Recent Labs    01/28/21 0235 01/29/21 0243  NA 134* 133*  K 4.3 4.2  CL 105 105  CO2 20* 24  BUN 21 15  CREATININE 0.67 0.68  GLUCOSE 130* 117*  CALCIUM 8.5* 7.8*   No results for input(s): LABPT, INR in the last 72 hours.  Neurologically intact Pelvis Portable  Result Date: 01/28/2021 CLINICAL DATA:  Postoperative for left hip prosthesis EXAM: PORTABLE PELVIS 1-2 VIEWS COMPARISON:  None. FINDINGS: Left hip hemiarthroplasty noted without fracture or early complicating feature. Typical early postoperative features including gas in the subcutaneous tissues skin staples. Degenerative disc disease and spondylosis at the lumbosacral junction. Bony demineralization. IMPRESSION: 1. Left hip hemiarthroplasty without complicating feature. 2. Bony demineralization. 3. Degenerative disc disease and spondylosis at the lumbosacral junction. Electronically Signed   By: Van Clines M.D.   On: 01/28/2021 14:20    Assessment/Plan: 1 Day Post-Op Procedure(s) (LRB): ARTHROPLASTY BIPOLAR HIP (HEMIARTHROPLASTY) (Left) Up with therapy, plan is home with family. WBAT  with walker  [name] 01/29/2021, 8:29 AM

## 2021-01-29 NOTE — Discharge Instructions (Signed)

## 2021-01-29 NOTE — Progress Notes (Signed)
Inpatient Rehab Admissions Coordinator:  Consult received. Attempted to contact pt in room via phone. No answer.  Will reattempt at later date.  Gayland Curry, [place], [place] Admissions Coordinator [phone]

## 2021-01-29 NOTE — Evaluation (Signed)
Occupational Therapy Evaluation Patient Details Name: [name] MRN: [id number] DOB: 24-Aug-1933 Today's Date: 01/29/2021    History of Present Illness 87 y.o. female presenting s/p mechanical fall on porch c/o L hip pain. Imaging (+) for L closed displaced femoral neck fx. Patient s/p cemented bipolar hemiarthroplasty on 3/20 by Dr. Lorin Mercy. Posterior hip precautions and LLE WBAT. PMHx significant for arthritis, lumbago, osteoporosis and lumbar laminectomy ~1985.   Clinical Impression   PTA patient was living alone in a private residence and was independent with ADLs/IADLs without AD. Patient reports having some DME at home from her husband to whom she was a caregiver for 3 years before he passed away. Patient currently functioning below baseline requiring Min A for bed mobility, Min guard to Min A for ADL transfers, and Min guard for short distance functional mobility with use of RW. Patient also limited by 5/10 pain in L hip and buttocks. Patient would benefit from continued acute OT services to maximize safety and independence with self-care tasks in prep for safe d/c to next level of care with recommendation for CIR.     Follow Up Recommendations  CIR    Equipment Recommendations  Other (comment) (Defer to next level of care.)    Recommendations for Other Services Rehab consult     Precautions / Restrictions Precautions Precautions: Posterior Hip Precaution Booklet Issued: No Precaution Comments: Verbally reviewed posterior hip precautions. Restrictions Weight Bearing Restrictions: Yes LLE Weight Bearing: Weight bearing as tolerated Other Position/Activity Restrictions: WBAT      Mobility Bed Mobility Overal bed mobility: Needs Assistance Bed Mobility: Supine to Sit     Supine to sit: HOB elevated;Min assist     General bed mobility comments: Min A at LLE and trunk with cues for technique and sequencing.    Transfers Overall transfer level: Needs  assistance Equipment used: Rolling walker (2 wheeled) Transfers: Sit to/from Stand Sit to Stand: Min guard         General transfer comment: Sit to stand from EOB positioned in lowest setting with cues for hand placement and LLE placement.    Balance Overall balance assessment: Needs assistance Sitting-balance support: Single extremity supported;No upper extremity supported;Feet supported Sitting balance-Leahy Scale: Good Sitting balance - Comments: Able to maintain static sitting balance at EOB without external assist.   Standing balance support: Bilateral upper extremity supported;During functional activity Standing balance-Leahy Scale: Poor Standing balance comment: Reliant on BUE on RW.                           ADL either performed or assessed with clinical judgement   ADL Overall ADL's : Needs assistance/impaired                 Upper Body Dressing : Set up;Sitting Upper Body Dressing Details (indicate cue type and reason): Donned posterior hospital gown seated EOB. Lower Body Dressing: Maximal assistance;Bed level Lower Body Dressing Details (indicate cue type and reason): Max A to don footwear in supine. Toilet Transfer: Minimal Insurance claims handler Details (indicate cue type and reason): Simulated with transfer to recliner with use of RW.           General ADL Comments: Patient limited by pain in L hip and buttocks requiring external assist for LB ADLs.     Vision Patient Visual Report: No change from baseline       Perception     Praxis      Pertinent Vitals/Pain  Pain Assessment: 0-10 Pain Score: 5  (0/10 pain at rest and 5/10 pain with mobility.) Faces Pain Scale: Hurts a little bit Pain Location: L hip and glut Pain Descriptors / Indicators: Sore Pain Intervention(s): Limited activity within patient's tolerance;Monitored during session;Premedicated before session;Repositioned     Hand Dominance Right   Extremity/Trunk  Assessment Upper Extremity Assessment Upper Extremity Assessment: Overall WFL for tasks assessed   Lower Extremity Assessment Lower Extremity Assessment: Defer to PT evaluation   Cervical / Trunk Assessment Cervical / Trunk Assessment: Normal   Communication Communication Communication: No difficulties   Cognition Arousal/Alertness: Awake/alert Behavior During Therapy: WFL for tasks assessed/performed Overall Cognitive Status: Within Functional Limits for tasks assessed                                     General Comments  Patient eager to participate with therapy this date. Patient is very motivated.    Exercises     Shoulder Instructions      Home Living Family/patient expects to be discharged to:: Private residence Living Arrangements: Alone Available Help at Discharge: Family Type of Home: House Home Access: Stairs to enter [employer] of Steps: 3 steps from driveway with bilateral rails (can reach both), +3 steps to front door with bilateral rails (cannot reach both). Entrance Stairs-Rails: Right;Left Home Layout: Multi-level;Able to live on main level with bedroom/bathroom     Bathroom Shower/Tub: Occupational psychologist:  (comfort ht)     Home Equipment: Clinical cytogeneticist - 2 wheels;Wheelchair - manual (BSC vs toilet riser (patient unable to clarify))          Prior Functioning/Environment Level of Independence: Independent        Comments: Independent with ADLs/IADLs. Drives. Enjoys baking.        OT Problem List: Decreased strength;Decreased range of motion;Decreased activity tolerance;Impaired balance (sitting and/or standing);Decreased knowledge of use of DME or AE;Decreased knowledge of precautions;Pain      OT Treatment/Interventions: Self-care/ADL training;Therapeutic exercise;Energy conservation;DME and/or AE instruction;Therapeutic activities;Patient/family education;Balance training    OT Goals(Current  goals can be found in the care plan section) Acute Rehab OT Goals Patient Stated Goal: To return home independently. OT Goal Formulation: With patient Time For Goal Achievement: 02/12/21 Potential to Achieve Goals: Good ADL Goals Pt Will Perform Grooming: with modified independence;standing Pt Will Perform Upper Body Dressing: with modified independence;sitting Pt Will Perform Lower Body Dressing: with modified independence;with adaptive equipment;sit to/from stand;sitting/lateral leans Pt Will Transfer to Toilet: with modified independence;ambulating Pt Will Perform Toileting - Clothing Manipulation and hygiene: with modified independence;sit to/from stand Pt Will Perform Tub/Shower Transfer: with supervision;shower seat;rolling walker Additional ADL Goal #1: Patient will recall and demo 3/3 hip precautions during ADLs with AE PRN.  OT Frequency: Min 2X/week   Barriers to D/C: Inaccessible home environment;Decreased caregiver support  Lives alone       Co-evaluation PT/OT/SLP Co-Evaluation/Treatment: Yes Reason for Co-Treatment: Complexity of the patient's impairments (multi-system involvement);To address functional/ADL transfers   OT goals addressed during session: ADL's and self-care      AM-PAC OT "6 Clicks" Daily Activity     Outcome Measure Help from another person eating meals?: None Help from another person taking care of personal grooming?: A Little Help from another person toileting, which includes using toliet, bedpan, or urinal?: A Little Help from another person bathing (including washing, rinsing, drying)?: A Lot Help from another person to  put on and taking off regular upper body clothing?: A Little Help from another person to put on and taking off regular lower body clothing?: A Lot 6 Click Score: 17   End of Session Equipment Utilized During Treatment: Gait belt;Rolling walker Nurse Communication: Mobility status  Activity Tolerance: Patient tolerated  treatment well Patient left: in chair;with call bell/phone within reach;with chair alarm set  OT Visit Diagnosis: Unsteadiness on feet (R26.81);Other abnormalities of gait and mobility (R26.89);Muscle weakness (generalized) (M62.81);History of falling (Z91.81);Pain Pain - Right/Left: Left Pain - part of body: Hip (Buttocks)                Time: 4114-6431 OT Time Calculation (min): 25 min Charges:  OT General Charges $OT Visit: 1 Visit OT Evaluation $OT Eval Moderate Complexity: 1 Mod OT Treatments $Therapeutic Activity: 8-22 mins  Hanadi Stanly H. OTR/L Supplemental OT, Department of rehab services [phone]  Romaldo Saville R H. 01/29/2021, 11:48 AM

## 2021-01-29 NOTE — Progress Notes (Signed)
PROGRESS NOTE    [name]  RWE:[id number] DOB: 1933-02-04 DOA: 01/27/2021 PCP: Crist Infante, MD   Brief Narrative: [name] is a 87 y.o. female with a history of hyperlipidemia and osteoporosis. Patient presented secondary to left hi pain after having a fall. Associated scalp laceration requiring staple repair in the ED. Orthopedic surgery consulted for management of hip fracture.   Assessment & Plan:   Principal Problem:   Closed left hip fracture, initial encounter [facility]) Active Problems:   HYPERCHOLESTEROLEMIA   Osteoporosis   Scalp laceration   Left hip fracture Secondary to fall. No LOC. In setting of osteoporosis history. Orthopedic surgery consulted on admission. Patient underwent left hip bipolar hemiarhtroplasty -Orthopedic surgery recommendations: WBAT with a walker -Analgesics per orthopedic surgery post-op  Scalp laceration CT head without associated intracranial bleed. Laceration repaired in the ED with staples with recommendation for removal 7-10 days post-repair  Acute blood loss anemia Perioperative blood loss Baseline hemoglobin of 12-13. Hemoglobin of 9.8 on POD1. -Trend CBC  Hyperlipidemia -Continue Zetia  DVT prophylaxis: Per orthopedic surgery Code Status:   Code Status: Full Code Family Communication: None at bedside Disposition Plan: Discharge home vs SNF vs CIR likely in 1-3 days pending orthopedic surgery, PT, OT recommendations   Consultants:   Orthopedic surgery  Procedures:   CEMENTED LEFT HIP BIPOLAR HEMIARTHROPLASTY (01/28/2021)  Antimicrobials:  None    Subjective: Pain is much better compared to pre-surgical repair.  Objective: Vitals:   01/28/21 1703 01/28/21 2110 01/29/21 0139 01/29/21 0457  BP: 125/72 (!) 111/59 121/65 114/67  Pulse: 92 75 78 77  Resp: 17 17 17 16   Temp:  98.2 F (36.8 C) 98.1 F (36.7 C) 97.8 F (36.6 C)  TempSrc:      SpO2: 99% 99% 99% 100%  Weight:      Height:         Intake/Output Summary (Last 24 hours) at 01/29/2021 0738 Last data filed at 01/29/2021 0600 Gross per 24 hour  Intake 2333.87 ml  Output 2050 ml  Net 283.87 ml   Filed Weights   01/27/21 2132  Weight: 45.6 kg    Examination:  General exam: Appears calm and comfortable Respiratory system: Clear to auscultation. Respiratory effort normal. Cardiovascular system: S1 & S2 heard, RRR. No murmurs, rubs, gallops or clicks. Gastrointestinal system: Abdomen is nondistended, soft and nontender. No organomegaly or masses felt. Normal bowel sounds heard. Central nervous system: Alert and oriented. No focal neurological deficits. Musculoskeletal: No edema. No calf tenderness Skin: No cyanosis. No rashes Psychiatry: Judgement and insight appear normal. Mood & affect appropriate.      Data Reviewed: I have personally reviewed following labs and imaging studies  CBC Lab Results  Component Value Date   WBC 9.1 01/29/2021   RBC 3.22 (L) 01/29/2021   HGB 9.8 (L) 01/29/2021   HCT 30.7 (L) 01/29/2021   MCV 95.3 01/29/2021   MCH 30.4 01/29/2021   PLT 177 01/29/2021   MCHC 31.9 01/29/2021   RDW 13.4 01/29/2021   LYMPHSABS 1.3 01/27/2021   MONOABS 0.8 01/27/2021   EOSABS 0.0 01/27/2021   BASOSABS 0.0 61/95/0932     Last metabolic panel Lab Results  Component Value Date   NA 133 (L) 01/29/2021   K 4.2 01/29/2021   CL 105 01/29/2021   CO2 24 01/29/2021   BUN 15 01/29/2021   CREATININE 0.68 01/29/2021   GLUCOSE 117 (H) 01/29/2021   GFRNONAA >60 01/29/2021   GFRAA 73 06/22/2018  CALCIUM 7.8 (L) 01/29/2021   PHOS 3.9 03/15/2010   PROT 6.3 09/28/2018   ALBUMIN 4.0 09/28/2018   LABGLOB 2.5 06/22/2018   AGRATIO 1.7 06/22/2018   BILITOT 0.3 09/28/2018   ALKPHOS 65 09/28/2018   AST 20 09/28/2018   ALT 17 09/28/2018   ANIONGAP 4 (L) 01/29/2021    CBG (last 3)  No results for input(s): GLUCAP in the last 72 hours.   GFR: Estimated Creatinine Clearance: 34.4 mL/min (by C-G  formula based on SCr of 0.68 mg/dL).  Coagulation Profile: No results for input(s): INR, PROTIME in the last 168 hours.  Recent Results (from the past 240 hour(s))  Resp Panel by RT-PCR (Flu A&B, Covid) Nasopharyngeal Swab     Status: None   Collection Time: 01/27/21  5:40 PM   Specimen: Nasopharyngeal Swab; Nasopharyngeal(NP) swabs in vial transport medium  Result Value Ref Range Status   SARS Coronavirus 2 by RT PCR NEGATIVE NEGATIVE Final    Comment: (NOTE) SARS-CoV-2 target nucleic acids are NOT DETECTED.  The SARS-CoV-2 RNA is generally detectable in upper respiratory specimens during the acute phase of infection. The lowest concentration of SARS-CoV-2 viral copies this assay can detect is 138 copies/mL. A negative result does not preclude SARS-Cov-2 infection and should not be used as the sole basis for treatment or other patient management decisions. A negative result may occur with  improper specimen collection/handling, submission of specimen other than nasopharyngeal swab, presence of viral mutation(s) within the areas targeted by this assay, and inadequate number of viral copies(<138 copies/mL). A negative result must be combined with clinical observations, patient history, and epidemiological information. The expected result is Negative.  Fact Sheet for Patients:  EntrepreneurPulse.com.au  Fact Sheet for Healthcare Providers:  IncredibleEmployment.be  This test is no t yet approved or cleared by the [place] FDA and  has been authorized for detection and/or diagnosis of SARS-CoV-2 by FDA under an Emergency Use Authorization (EUA). This EUA will remain  in effect (meaning this test can be used) for the duration of the COVID-19 declaration under Section 564(b)(1) of the Act, 21 U.S.C.section 360bbb-3(b)(1), unless the authorization is terminated  or revoked sooner.       Influenza A by PCR NEGATIVE NEGATIVE Final    Influenza B by PCR NEGATIVE NEGATIVE Final    Comment: (NOTE) The Xpert Xpress SARS-CoV-2/FLU/RSV plus assay is intended as an aid in the diagnosis of influenza from Nasopharyngeal swab specimens and should not be used as a sole basis for treatment. Nasal washings and aspirates are unacceptable for Xpert Xpress SARS-CoV-2/FLU/RSV testing.  Fact Sheet for Patients: EntrepreneurPulse.com.au  Fact Sheet for Healthcare Providers: IncredibleEmployment.be  This test is not yet approved or cleared by the [place] FDA and has been authorized for detection and/or diagnosis of SARS-CoV-2 by FDA under an Emergency Use Authorization (EUA). This EUA will remain in effect (meaning this test can be used) for the duration of the COVID-19 declaration under Section 564(b)(1) of the Act, 21 U.S.C. section 360bbb-3(b)(1), unless the authorization is terminated or revoked.  Performed at [facility], [place] [address], [place], [place] [address]   Surgical pcr screen     Status: None   Collection Time: 01/27/21 10:57 PM   Specimen: Nasal Mucosa; Nasal Swab  Result Value Ref Range Status   MRSA, PCR NEGATIVE NEGATIVE Final   Staphylococcus aureus NEGATIVE NEGATIVE Final    Comment: (NOTE) The Xpert SA Assay (FDA approved for NASAL specimens in patients 22 years  of age and older), is one component of a comprehensive surveillance program. It is not intended to diagnose infection nor to guide or monitor treatment. Performed at [facility], [place] [address], [place], [place] [address]         Radiology Studies: CT Head Wo Contrast  Result Date: 01/27/2021 CLINICAL DATA:  Head trauma, minor. Additional provided: Fall at home hitting left head on banister. Laceration to left head. EXAM: CT HEAD WITHOUT CONTRAST TECHNIQUE: Contiguous axial images were obtained from the base of the skull through the vertex without intravenous  contrast. COMPARISON:  No pertinent prior exams available for comparison. FINDINGS: Brain: Mild cerebral atrophy. There is no acute intracranial hemorrhage. No demarcated cortical infarct. No extra-axial fluid collection. No evidence of intracranial mass. No midline shift. Vascular: No hyperdense vessel.  Atherosclerotic calcifications Skull: Normal. Negative for fracture or focal lesion. Sinuses/Orbits: Visualized orbits show no acute finding. Small left sphenoid sinus fluid level. IMPRESSION: No evidence of acute intracranial abnormality. Mild cerebral atrophy. Left sphenoid sinusitis. Electronically Signed   By: Kellie Simmering DO   On: 01/27/2021 18:26   Pelvis Portable  Result Date: 01/28/2021 CLINICAL DATA:  Postoperative for left hip prosthesis EXAM: PORTABLE PELVIS 1-2 VIEWS COMPARISON:  None. FINDINGS: Left hip hemiarthroplasty noted without fracture or early complicating feature. Typical early postoperative features including gas in the subcutaneous tissues skin staples. Degenerative disc disease and spondylosis at the lumbosacral junction. Bony demineralization. IMPRESSION: 1. Left hip hemiarthroplasty without complicating feature. 2. Bony demineralization. 3. Degenerative disc disease and spondylosis at the lumbosacral junction. Electronically Signed   By: Van Clines M.D.   On: 01/28/2021 14:20   DG Chest Port 1 View  Result Date: 01/27/2021 CLINICAL DATA:  Preop EXAM: PORTABLE CHEST 1 VIEW COMPARISON:  03/02/2014 FINDINGS: Mild peribronchial thickening. Heart and mediastinal contours are within normal limits. No focal opacities or effusions. No acute bony abnormality. IMPRESSION: Mild bronchitic changes. Electronically Signed   By: Rolm Baptise M.D.   On: 01/27/2021 18:17   DG Hip Unilat With Pelvis 2-3 Views Left  Result Date: 01/27/2021 CLINICAL DATA:  Left hip pain after a trip and fall today. Initial encounter. EXAM: DG HIP (WITH OR WITHOUT PELVIS) 2-3V LEFT COMPARISON:  None.  FINDINGS: The patient has an acute subcapital fracture of the left hip. No other acute abnormality is identified. Lower lumbar spondylosis noted. IMPRESSION: Acute subcapital fracture left hip. Electronically Signed   By: Inge Rise M.D.   On: 01/27/2021 18:17        Scheduled Meds: . acetaminophen  500 mg Oral Q6H  . aspirin EC  325 mg Oral Q breakfast  . Chlorhexidine Gluconate Cloth  6 each Topical Daily  . docusate sodium  100 mg Oral BID  . ezetimibe  10 mg Oral Daily  . feeding supplement  237 mL Oral BID BM  . multivitamin with minerals  1 tablet Oral Daily   Continuous Infusions: . sodium chloride 75 mL/hr at 01/29/21 0055     LOS: 2 days     Cordelia Poche, MD [facility] 01/29/2021, 7:38 AM  If 7PM-7AM, please contact night-coverage [email or web address]

## 2021-01-29 NOTE — Plan of Care (Signed)
  Problem: Clinical Measurements: Goal: Will remain free from infection Outcome: Progressing   Problem: Activity: Goal: Risk for activity intolerance will decrease Outcome: Progressing   Problem: Pain Managment: Goal: General experience of comfort will improve Outcome: Progressing   Problem: Safety: Goal: Ability to remain free from injury will improve Outcome: Progressing   Problem: Activity: Goal: Ability to ambulate and perform ADLs will improve Outcome: Progressing   Problem: Clinical Measurements: Goal: Postoperative complications will be avoided or minimized Outcome: Progressing   Problem: Pain Management: Goal: Pain level will decrease Outcome: Progressing

## 2021-01-29 NOTE — TOC Initial Note (Signed)
Transition of Care [facility]) - Initial/Assessment Note   Patient Details  Name: [name] MRN: [id number] Date of Birth: [birth year]  Transition of [facility]) CM/SW Contact:    Sherie Don, LCSW Phone Number: 01/29/2021, 11:22 AM  Clinical Narrative: Patient is an 87 year old female who was admitted for closed left hip fracture. CSW met with patient to discuss SNF vs. HH. Per patient, she wants to discharge home with [facility] rather than going to a SNF for rehab even if PT recommends SNF. Patient requested times to review/consider her [facility] agency options as she has not had [place] before. Patient's current DME includes a walker, wheelchair, BSC, and cane. TOC to follow up with patient's choice for HHPT.  Expected Discharge Plan: [place] Barriers to Discharge: Continued Medical Work up  Patient Goals and CMS Choice Patient states their goals for this hospitalization and ongoing recovery are:: Discharge home with [facility] services CMS Medicare.gov Compare Post Acute Care list provided to:: Patient Choice offered to / list presented to : Patient  Expected Discharge Plan and Services Expected Discharge Plan: [place] In-house Referral: Clinical Social Work Post Acute Care Choice: [place] arrangements for the past 2 months: [place]             DME Arranged: N/A DME Agency: NA  Prior Living Arrangements/Services Living arrangements for the past 2 months: [place] Lives with:: Self Patient language and need for interpreter reviewed:: Yes Do you feel safe going back to the place where you live?: Yes      Need for Family Participation in Patient Care: No (Comment) Care giver support system in place?: Yes (comment) Current home services: DME (Walker, wheelchair, cane, BSC) Criminal Activity/Legal Involvement Pertinent to Current Situation/Hospitalization: No - Comment as needed  Activities of Daily Living Home Assistive  Devices/Equipment: Cane (specify quad or straight),Wheelchair,Walker (specify type) ADL Screening (condition at time of admission) Patient's cognitive ability adequate to safely complete daily activities?: Yes Is the patient deaf or have difficulty hearing?: No Does the patient have difficulty seeing, even when wearing glasses/contacts?: No Does the patient have difficulty concentrating, remembering, or making decisions?: No Patient able to express need for assistance with ADLs?: Yes Does the patient have difficulty dressing or bathing?: No Independently performs ADLs?: No Communication: Independent Dressing (OT): Needs assistance Is this a change from baseline?: Change from baseline, expected to last >3 days Grooming: Independent Feeding: Independent Bathing: Needs assistance Is this a change from baseline?: Change from baseline, expected to last >3 days Toileting: Needs assistance Is this a change from baseline?: Change from baseline, expected to last >3days In/Out Bed: Needs assistance Is this a change from baseline?: Change from baseline, expected to last >3 days Walks in Home: Needs assistance Is this a change from baseline?: Change from baseline, expected to last >3 days Does the patient have difficulty walking or climbing stairs?: Yes Weakness of Legs: Left Weakness of Arms/Hands: None  Permission Sought/Granted Permission sought to share information with : Other (comment) Permission granted to share information with : Yes, Verbal Permission Granted Permission granted to share info w AGENCY: [place] agencies  Emotional Assessment Appearance:: Appears stated age Attitude/Demeanor/Rapport: Engaged Affect (typically observed): Accepting Orientation: : Oriented to Self,Oriented to Place,Oriented to  Time,Oriented to Situation Alcohol / Substance Use: Not Applicable Psych Involvement: No (comment)  Admission diagnosis:  Closed left hip fracture, initial encounter [facility])  [S72.002A] Patient Active Problem List  Diagnosis Date Noted  . Closed left hip fracture, initial encounter ([place]) 01/27/2021  . Scalp laceration   . Palpitations 12/04/2012  . Chest pain 11/20/2012  . DJD (degenerative joint disease) 11/22/2011  . NECK PAIN 03/01/2008  . BACK PAIN, LUMBAR 03/01/2008  . COLONIC POLYPS 02/29/2008  . HYPERCHOLESTEROLEMIA 02/29/2008  . Osteoporosis 02/29/2008   PCP:  Crist Infante, MD Pharmacy:   [id number] [place], [place]Phone: [phone]: [phone] [facility] [address] [address] [address] [id number]: [phone]: [phone] Readmission Risk Interventions No flowsheet data found.

## 2021-01-29 NOTE — Evaluation (Signed)
Physical Therapy Evaluation Patient Details Name: [name] MRN: [id number] DOB: 08/26/1933 Today's Date: 01/29/2021   History of Present Illness  87 y.o. female presenting s/p mechanical fall on porch c/o L hip pain. Imaging (+) for L closed displaced femoral neck fx. Patient s/p cemented bipolar posterior hemiarthroplasty on 3/20 by Dr. Lorin Mercy. Posterior hip precautions and LLE WBAT. PMHx significant for arthritis, lumbago, osteoporosis and lumbar laminectomy ~1985.  Clinical Impression  Pt is s/p posterior hemiarthroplasty L hip resulting in the deficits listed below (see PT Problem List).  Pt declines SNF currently and also does not want her son to come and assist her at home. Recommend CIR vs HHPT pending progress/home support and what pt is agreeable to   Pt will benefit from skilled PT to increase their independence and safety with mobility to allow discharge to the venue listed below.      Follow Up Recommendations CIR    Equipment Recommendations  Rolling walker with 5" wheels (youth ht)    Recommendations for Other Services       Precautions / Restrictions Precautions Precautions: Posterior Hip Precaution Booklet Issued: No Precaution Comments: Verbally reviewed posterior hip precautions. Restrictions Weight Bearing Restrictions: No LLE Weight Bearing: Weight bearing as tolerated Other Position/Activity Restrictions: WBAT      Mobility  Bed Mobility Overal bed mobility: Needs Assistance Bed Mobility: Supine to Sit     Supine to sit: HOB elevated;Min assist     General bed mobility comments: Min A at LLE and trunk with cues for technique and sequencing.    Transfers Overall transfer level: Needs assistance Equipment used: Rolling walker (2 wheeled) Transfers: Sit to/from Stand Sit to Stand: Min guard         General transfer comment: Sit to stand from EOB positioned in lowest setting with cues for hand placement and LLE  placement.  Ambulation/Gait Ambulation/Gait assistance: Min guard Gait Distance (Feet): 40 Feet Assistive device: Rolling walker (2 wheeled) Gait Pattern/deviations: Step-to pattern;Decreased weight shift to left     General Gait Details: cues for sequence and RW position from self. min/guard assist for safety  Stairs            Wheelchair Mobility    Modified Rankin (Stroke Patients Only)       Balance Overall balance assessment: Needs assistance Sitting-balance support: Single extremity supported;No upper extremity supported;Feet supported Sitting balance-Leahy Scale: Good Sitting balance - Comments: Able to maintain static sitting balance at EOB without external assist.   Standing balance support: Bilateral upper extremity supported;During functional activity Standing balance-Leahy Scale: Poor Standing balance comment: Reliant on BUE on RW.                             Pertinent Vitals/Pain Pain Assessment: 0-10 Pain Score: 5  (0/10 pain at rest and 5/10 pain with mobility.) Faces Pain Scale: Hurts a little bit Pain Location: L hip and glut Pain Descriptors / Indicators: Sore Pain Intervention(s): Limited activity within patient's tolerance;Monitored during session;Premedicated before session    Home Living Family/patient expects to be discharged to:: Private residence Living Arrangements: Alone Available Help at Discharge: Family Type of Home: House Home Access: Stairs to enter Entrance Stairs-Rails: Psychiatric nurse of Steps: 3 steps from driveway with bilateral rails (can reach both), +3 steps to front door with bilateral rails (cannot reach both). Home Layout: Multi-level;Able to live on main level with bedroom/bathroom Home Equipment: Shower seat;Walker - 2 wheels;Wheelchair -  manual      Prior Function Level of Independence: Independent         Comments: Independent with ADLs/IADLs. Drives. Enjoys baking.     Hand  Dominance   Dominant Hand: Right    Extremity/Trunk Assessment   Upper Extremity Assessment Upper Extremity Assessment: Overall WFL for tasks assessed;Defer to OT evaluation    Lower Extremity Assessment Lower Extremity Assessment: LLE deficits/detail LLE Deficits / Details: ankle WFL, knee and hip AAROM grossly WFL within limits of precautions. strength 2+ to3/5, limited by postop pain at hip    Cervical / Trunk Assessment Cervical / Trunk Assessment: Normal  Communication   Communication: No difficulties  Cognition Arousal/Alertness: Awake/alert Behavior During Therapy: WFL for tasks assessed/performed Overall Cognitive Status: Within Functional Limits for tasks assessed                                        General Comments General comments (skin integrity, edema, etc.): Patient eager to participate with therapy this date. Patient is very motivated.    Exercises General Exercises - Lower Extremity Ankle Circles/Pumps: AROM;Both;Supine;10 reps   Assessment/Plan    PT Assessment Patient needs continued PT services  PT Problem List Decreased strength;Decreased mobility;Decreased range of motion;Decreased activity tolerance;Decreased knowledge of use of DME;Pain;Decreased balance       PT Treatment Interventions DME instruction;Therapeutic activities;Gait training;Functional mobility training;Therapeutic exercise;Patient/family education;Stair training;Balance training    PT Goals (Current goals can be found in the Care Plan section)  Acute Rehab PT Goals Patient Stated Goal: To return home independently. PT Goal Formulation: With patient Time For Goal Achievement: 02/12/21 Potential to Achieve Goals: Good    Frequency Min 3X/week   Barriers to discharge        Co-evaluation PT/OT/SLP Co-Evaluation/Treatment: Yes Reason for Co-Treatment: Complexity of the patient's impairments (multi-system involvement) PT goals addressed during session:  Mobility/safety with mobility OT goals addressed during session: ADL's and self-care       AM-PAC PT "6 Clicks" Mobility  Outcome Measure Help needed turning from your back to your side while in a flat bed without using bedrails?: A Little Help needed moving from lying on your back to sitting on the side of a flat bed without using bedrails?: A Little Help needed moving to and from a bed to a chair (including a wheelchair)?: A Little Help needed standing up from a chair using your arms (e.g., wheelchair or bedside chair)?: A Little Help needed to walk in hospital room?: A Little Help needed climbing 3-5 steps with a railing? : A Lot 6 Click Score: 17    End of Session Equipment Utilized During Treatment: Gait belt Activity Tolerance: Patient tolerated treatment well Patient left: in chair;with call bell/phone within reach;with chair alarm set Nurse Communication: Mobility status PT Visit Diagnosis: Other abnormalities of gait and mobility (R26.89)    Time: 1610-9604 PT Time Calculation (min) (ACUTE ONLY): 21 min   Charges:   PT Evaluation $PT Eval Low Complexity: [place], PT  Acute Rehab Dept ([place]) [phone] Pager [phone]  01/29/2021   [facility] 01/29/2021, 12:56 PM

## 2021-01-30 LAB — BASIC METABOLIC PANEL
Anion gap: 8 (ref 5–15)
BUN: 16 mg/dL (ref 8–23)
CO2: 22 mmol/L (ref 22–32)
Calcium: 8.1 mg/dL — ABNORMAL LOW (ref 8.9–10.3)
Chloride: 104 mmol/L (ref 98–111)
Creatinine, Ser: 0.68 mg/dL (ref 0.44–1.00)
GFR, Estimated: >60 mL/min (ref >60)
Glucose, Bld: 108 mg/dL — ABNORMAL HIGH (ref 70–99)
Potassium: 3.6 mmol/L (ref 3.5–5.1)
Sodium: 134 mmol/L — ABNORMAL LOW (ref 135–145)

## 2021-01-30 LAB — CBC
HCT: 29.1 % — ABNORMAL LOW (ref 36.0–46.0)
Hemoglobin: 9.5 g/dL — ABNORMAL LOW (ref 12.0–15.0)
MCH: 30.6 pg (ref 26.0–34.0)
MCHC: 32.6 g/dL (ref 30.0–36.0)
MCV: 93.9 fL (ref 80.0–100.0)
Platelets: 169 10*3/uL (ref 150–400)
RBC: 3.1 MIL/uL — ABNORMAL LOW (ref 3.87–5.11)
RDW: 13.5 % (ref 11.5–15.5)
WBC: 9.8 10*3/uL (ref 4.0–10.5)
nRBC: 0 % (ref 0.0–0.2)

## 2021-01-30 MED ORDER — DOCUSATE SODIUM 100 MG PO CAPS: 100.0000 mg | ORAL_CAPSULE | Freq: Two times a day (BID) | ORAL | 0 refills | Status: DC

## 2021-01-30 NOTE — Plan of Care (Signed)
  Problem: Health Behavior/Discharge Planning: Goal: Ability to manage health-related needs will improve Outcome: Progressing   Problem: Pain Managment: Goal: General experience of comfort will improve Outcome: Progressing   Problem: Safety: Goal: Ability to remain free from injury will improve Outcome: Progressing   Problem: Skin Integrity: Goal: Risk for impaired skin integrity will decrease Outcome: Progressing

## 2021-01-30 NOTE — Discharge Summary (Signed)
Physician Discharge Summary  [name] KDT:[id number] DOB: Oct 10, 1933 DOA: 01/27/2021  PCP: Crist Infante, MD  Admit date: 01/27/2021 Discharge date: 01/30/2021  Admitted From: Home Disposition: Home  Recommendations for Outpatient Follow-up:  1. Follow up with PCP in 1 week 2. Please obtain BMP/CBC in one week 3. Please follow up on the following pending results: None  Home Health: Physical therapy, Occupational Therapy Equipment/Devices: Rolling walker  Discharge Condition: Stable CODE STATUS: Full code Diet recommendation: Heart healthy  Brief/Interim Summary:  Admission HPI written by Vianne Bulls, MD   Chief Complaint: fall, left hip pain  HPI: [name] is a 87 y.o. female with medical history significant for hyperlipidemia and osteoporosis, now presenting to the emergency department for evaluation of left hip pain and scalp laceration after a fall at home.  Patient reports she was in her usual state of health and was doing some work on her porch at home when she tripped and fell, striking the left side of her head on the handrail before falling onto her left side.  She had immediate and severe pain at the left hip and she was on the ground for roughly 25 minutes before a neighbor came to help her. She denies losing consciousness and does not have any pain or suspected injury apart from the left hip. She denies any recent illness. She exercises regularly, often walking 5 miles at a time.    Hospital course:  Left hip fracture Secondary to fall. No LOC. In setting of osteoporosis history. Orthopedic surgery consulted on admission. Patient underwent left hip bipolar hemiarhtroplasty.  Per orthopedic surgery, patient is weightbearing as tolerated with a walker.  Physical therapy recommending inpatient rehab for which the patient declined.  Plan for home health physical therapy and occupational therapy on discharge.  Scalp laceration CT head without  associated intracranial bleed. Laceration repaired in the ED with staples with recommendation for removal 7-10 days post-repair  Acute blood loss anemia Perioperative blood loss Baseline hemoglobin of 12-13. Hemoglobin of 9.8 immediately postop and is stable.  Repeat CBC as an outpatient.  Hyperlipidemia Continue Zetia  Discharge Diagnoses:  Principal Problem:   Closed left hip fracture, initial encounter [facility]) Active Problems:   HYPERCHOLESTEROLEMIA   Osteoporosis   Scalp laceration    Discharge Instructions  Discharge Instructions    Call MD for:  redness, tenderness, or signs of infection (pain, swelling, redness, odor or green/yellow discharge around incision site)   Complete by: As directed    Call MD for:  severe uncontrolled pain   Complete by: As directed    Diet - low sodium heart healthy   Complete by: As directed    Increase activity slowly   Complete by: As directed    No wound care   Complete by: As directed    Weight bearing as tolerated   Complete by: As directed      Allergies as of 01/30/2021      Reactions   Risedronate Sodium Other (See Comments)   Reaction to Actonel   Cephalexin Other (See Comments)   Upset stomach and made very tired   Hydrocodone Nausea Only      Medication List    TAKE these medications   aspirin 325 MG tablet Commonly known as: Bayer Aspirin Take 1 tablet (325 mg total) by mouth daily. Take one daily for one month to decrease blood clot risks. Stop after one month   CALCIUM-D PO Take 1 tablet by mouth  2 (two) times daily.   docusate sodium 100 MG capsule Commonly known as: COLACE Take 1 capsule (100 mg total) by mouth 2 (two) times daily.   ezetimibe 10 MG tablet Commonly known as: ZETIA Take 1 tablet (10 mg total) by mouth daily. you What changed: additional instructions   HYDROcodone-acetaminophen 5-325 MG tablet Commonly known as: Norco Take 1 tablet by mouth every 6 (six) hours as needed for moderate  pain.   MOVE FREE PO Take 1 tablet by mouth daily.   multivitamin with minerals Tabs tablet Take 1 tablet by mouth daily.   TURMERIC PO Take 1 capsule by mouth 2 (two) times daily.   VITAMIN D3 PO Take 1 tablet by mouth daily.            Discharge Care Instructions  (From admission, onward)         Start     Ordered   01/29/21 0000  Weight bearing as tolerated        01/29/21 7517          Follow-up Information    [name], MD Follow up in 2 week(s).   Specialty: Orthopedic Surgery Contact information: [place] [address] [place], [facility] Follow up.   Specialty: [place] Why: PT, OT Contact information: [place] [address] [phone]        Crist Infante, MD. Schedule an appointment as soon as possible for a visit in 1 week(s).   Specialty: Internal Medicine Why: Hospital follo-up Contact information: [address] [place] [address] [phone]              Allergies  Allergen Reactions  . Risedronate Sodium Other (See Comments)    Reaction to Actonel  . Cephalexin Other (See Comments)    Upset stomach and made very tired  . Hydrocodone Nausea Only    Consultations:  Orthopedic surgery   Procedures/Studies: CT Head Wo Contrast  Result Date: 01/27/2021 CLINICAL DATA:  Head trauma, minor. Additional provided: Fall at home hitting left head on banister. Laceration to left head. EXAM: CT HEAD WITHOUT CONTRAST TECHNIQUE: Contiguous axial images were obtained from the base of the skull through the vertex without intravenous contrast. COMPARISON:  No pertinent prior exams available for comparison. FINDINGS: Brain: Mild cerebral atrophy. There is no acute intracranial hemorrhage. No demarcated cortical infarct. No extra-axial fluid collection. No evidence of intracranial mass. No midline shift. Vascular: No hyperdense vessel.  Atherosclerotic  calcifications Skull: Normal. Negative for fracture or focal lesion. Sinuses/Orbits: Visualized orbits show no acute finding. Small left sphenoid sinus fluid level. IMPRESSION: No evidence of acute intracranial abnormality. Mild cerebral atrophy. Left sphenoid sinusitis. Electronically Signed   By: Kellie Simmering DO   On: 01/27/2021 18:26   Pelvis Portable  Result Date: 01/28/2021 CLINICAL DATA:  Postoperative for left hip prosthesis EXAM: PORTABLE PELVIS 1-2 VIEWS COMPARISON:  None. FINDINGS: Left hip hemiarthroplasty noted without fracture or early complicating feature. Typical early postoperative features including gas in the subcutaneous tissues skin staples. Degenerative disc disease and spondylosis at the lumbosacral junction. Bony demineralization. IMPRESSION: 1. Left hip hemiarthroplasty without complicating feature. 2. Bony demineralization. 3. Degenerative disc disease and spondylosis at the lumbosacral junction. Electronically Signed   By: Van Clines M.D.   On: 01/28/2021 14:20   DG Chest Port 1 View  Result Date: 01/27/2021 CLINICAL DATA:  Preop EXAM: PORTABLE CHEST 1 VIEW COMPARISON:  03/02/2014 FINDINGS: Mild peribronchial thickening. Heart and mediastinal contours are within normal limits. No focal opacities or effusions. No acute bony abnormality. IMPRESSION: Mild bronchitic changes. Electronically Signed   By: Rolm Baptise M.D.   On: 01/27/2021 18:17   DG Hip Unilat With Pelvis 2-3 Views Left  Result Date: 01/27/2021 CLINICAL DATA:  Left hip pain after a trip and fall today. Initial encounter. EXAM: DG HIP (WITH OR WITHOUT PELVIS) 2-3V LEFT COMPARISON:  None. FINDINGS: The patient has an acute subcapital fracture of the left hip. No other acute abnormality is identified. Lower lumbar spondylosis noted. IMPRESSION: Acute subcapital fracture left hip. Electronically Signed   By: Inge Rise M.D.   On: 01/27/2021 18:17       Subjective: Pain is much improved today.  No  concerns today.  Discharge Exam: Vitals:   01/29/21 2131 01/30/21 0556  BP: 110/60 112/62  Pulse: 95 71  Resp: 16 16  Temp: 100 F (37.8 C) 97.7 F (36.5 C)  SpO2: 94% 97%   Vitals:   01/29/21 0923 01/29/21 1246 01/29/21 2131 01/30/21 0556  BP: (!) 114/56 121/62 110/60 112/62  Pulse: 73 74 95 71  Resp: 18 18 16 16   Temp: 98 F (36.7 C) 98.3 F (36.8 C) 100 F (37.8 C) 97.7 F (36.5 C)  TempSrc: Oral Oral  Oral  SpO2: 98% 95% 94% 97%  Weight:      Height:        General: Pt is alert, awake, not in acute distress Cardiovascular: RRR, S1/S2 +, no rubs, no gallops Respiratory: CTA bilaterally, no wheezing, no rhonchi Abdominal: Soft, NT, ND, bowel sounds + Extremities: no edema, no cyanosis    The results of significant diagnostics from this hospitalization (including imaging, microbiology, ancillary and laboratory) are listed below for reference.     Microbiology: Recent Results (from the past 240 hour(s))  Resp Panel by RT-PCR (Flu A&B, Covid) Nasopharyngeal Swab     Status: None   Collection Time: 01/27/21  5:40 PM   Specimen: Nasopharyngeal Swab; Nasopharyngeal(NP) swabs in vial transport medium  Result Value Ref Range Status   SARS Coronavirus 2 by RT PCR NEGATIVE NEGATIVE Final    Comment: (NOTE) SARS-CoV-2 target nucleic acids are NOT DETECTED.  The SARS-CoV-2 RNA is generally detectable in upper respiratory specimens during the acute phase of infection. The lowest concentration of SARS-CoV-2 viral copies this assay can detect is 138 copies/mL. A negative result does not preclude SARS-Cov-2 infection and should not be used as the sole basis for treatment or other patient management decisions. A negative result may occur with  improper specimen collection/handling, submission of specimen other than nasopharyngeal swab, presence of viral mutation(s) within the areas targeted by this assay, and inadequate number of viral copies(<138 copies/mL). A negative  result must be combined with clinical observations, patient history, and epidemiological information. The expected result is Negative.  Fact Sheet for Patients:  EntrepreneurPulse.com.au  Fact Sheet for Healthcare Providers:  IncredibleEmployment.be  This test is no t yet approved or cleared by the [place] FDA and  has been authorized for detection and/or diagnosis of SARS-CoV-2 by FDA under an Emergency Use Authorization (EUA). This EUA will remain  in effect (meaning this test can be used) for the duration of the COVID-19 declaration under Section 564(b)(1) of the Act, 21 U.S.C.section 360bbb-3(b)(1), unless the authorization is terminated  or revoked sooner.       Influenza A by PCR NEGATIVE NEGATIVE Final   Influenza B  by PCR NEGATIVE NEGATIVE Final    Comment: (NOTE) The Xpert Xpress SARS-CoV-2/FLU/RSV plus assay is intended as an aid in the diagnosis of influenza from Nasopharyngeal swab specimens and should not be used as a sole basis for treatment. Nasal washings and aspirates are unacceptable for Xpert Xpress SARS-CoV-2/FLU/RSV testing.  Fact Sheet for Patients: EntrepreneurPulse.com.au  Fact Sheet for Healthcare Providers: IncredibleEmployment.be  This test is not yet approved or cleared by the [place] FDA and has been authorized for detection and/or diagnosis of SARS-CoV-2 by FDA under an Emergency Use Authorization (EUA). This EUA will remain in effect (meaning this test can be used) for the duration of the COVID-19 declaration under Section 564(b)(1) of the Act, 21 U.S.C. section 360bbb-3(b)(1), unless the authorization is terminated or revoked.  Performed at [facility], [place] [address] [place], [place] [address]   Surgical pcr screen     Status: None   Collection Time: 01/27/21 10:57 PM   Specimen: Nasal Mucosa; Nasal Swab  Result Value Ref Range Status    MRSA, PCR NEGATIVE NEGATIVE Final   Staphylococcus aureus NEGATIVE NEGATIVE Final    Comment: (NOTE) The Xpert SA Assay (FDA approved for NASAL specimens in patients 3 years of age and older), is one component of a comprehensive surveillance program. It is not intended to diagnose infection nor to guide or monitor treatment. Performed at [facility], [place] [address], [place], [place] [address]      Labs: BNP (last 3 results) No results for input(s): BNP in the last 8760 hours. Basic Metabolic Panel: Recent Labs  Lab 01/27/21 1827 01/28/21 0235 01/29/21 0243 01/30/21 0245  NA 134* 134* 133* 134*  K 3.9 4.3 4.2 3.6  CL 104 105 105 104  CO2 22 20* 24 22  GLUCOSE 109* 130* 117* 108*  BUN 30* 21 15 16   CREATININE 0.84 0.67 0.68 0.68  CALCIUM 8.7* 8.5* 7.8* 8.1*   Liver Function Tests: No results for input(s): AST, ALT, ALKPHOS, BILITOT, PROT, ALBUMIN in the last 168 hours. No results for input(s): LIPASE, AMYLASE in the last 168 hours. No results for input(s): AMMONIA in the last 168 hours. CBC: Recent Labs  Lab 01/27/21 1827 01/28/21 0235 01/29/21 0243 01/30/21 0245  WBC 12.4* 9.9 9.1 9.8  NEUTROABS 10.2*  --   --   --   HGB 12.6 12.3 9.8* 9.5*  HCT 39.4 38.0 30.7* 29.1*  MCV 93.8 94.3 95.3 93.9  PLT 243 228 177 169   Cardiac Enzymes: No results for input(s): CKTOTAL, CKMB, CKMBINDEX, TROPONINI in the last 168 hours. BNP: Invalid input(s): POCBNP CBG: No results for input(s): GLUCAP in the last 168 hours. D-Dimer No results for input(s): DDIMER in the last 72 hours. Hgb A1c No results for input(s): HGBA1C in the last 72 hours. Lipid Profile No results for input(s): CHOL, HDL, LDLCALC, TRIG, CHOLHDL, LDLDIRECT in the last 72 hours. Thyroid function studies No results for input(s): TSH, T4TOTAL, T3FREE, THYROIDAB in the last 72 hours.  Invalid input(s): FREET3 Anemia work up No results for input(s): VITAMINB12, FOLATE, FERRITIN, TIBC,  IRON, RETICCTPCT in the last 72 hours. Urinalysis    Component Value Date/Time   COLORURINE YELLOW 01/07/2007 1007   APPEARANCEUR Turbid 01/07/2007 1007   LABSPEC 1.015 01/07/2007 1007   PHURINE 8.0 01/07/2007 1007   GLUCOSEU NEGATIVE 01/07/2007 1007   BILIRUBINUR negative 02/13/2017 1536   KETONESUR negative 02/13/2017 1536   KETONESUR TRACE (A) 01/07/2007 1007   PROTEINUR trace (A)  02/13/2017 1536   UROBILINOGEN 0.2 02/13/2017 1536   UROBILINOGEN 0.2 mg/dL 01/07/2007 1007   NITRITE Negative 02/13/2017 1536   NITRITE Negative 01/07/2007 1007   LEUKOCYTESUR small (1+) (A) 02/13/2017 1536   Sepsis Labs Invalid input(s): PROCALCITONIN,  WBC,  LACTICIDVEN Microbiology Recent Results (from the past 240 hour(s))  Resp Panel by RT-PCR (Flu A&B, Covid) Nasopharyngeal Swab     Status: None   Collection Time: 01/27/21  5:40 PM   Specimen: Nasopharyngeal Swab; Nasopharyngeal(NP) swabs in vial transport medium  Result Value Ref Range Status   SARS Coronavirus 2 by RT PCR NEGATIVE NEGATIVE Final    Comment: (NOTE) SARS-CoV-2 target nucleic acids are NOT DETECTED.  The SARS-CoV-2 RNA is generally detectable in upper respiratory specimens during the acute phase of infection. The lowest concentration of SARS-CoV-2 viral copies this assay can detect is 138 copies/mL. A negative result does not preclude SARS-Cov-2 infection and should not be used as the sole basis for treatment or other patient management decisions. A negative result may occur with  improper specimen collection/handling, submission of specimen other than nasopharyngeal swab, presence of viral mutation(s) within the areas targeted by this assay, and inadequate number of viral copies(<138 copies/mL). A negative result must be combined with clinical observations, patient history, and epidemiological information. The expected result is Negative.  Fact Sheet for Patients:  EntrepreneurPulse.com.au  Fact  Sheet for Healthcare Providers:  IncredibleEmployment.be  This test is no t yet approved or cleared by the [place] FDA and  has been authorized for detection and/or diagnosis of SARS-CoV-2 by FDA under an Emergency Use Authorization (EUA). This EUA will remain  in effect (meaning this test can be used) for the duration of the COVID-19 declaration under Section 564(b)(1) of the Act, 21 U.S.C.section 360bbb-3(b)(1), unless the authorization is terminated  or revoked sooner.       Influenza A by PCR NEGATIVE NEGATIVE Final   Influenza B by PCR NEGATIVE NEGATIVE Final    Comment: (NOTE) The Xpert Xpress SARS-CoV-2/FLU/RSV plus assay is intended as an aid in the diagnosis of influenza from Nasopharyngeal swab specimens and should not be used as a sole basis for treatment. Nasal washings and aspirates are unacceptable for Xpert Xpress SARS-CoV-2/FLU/RSV testing.  Fact Sheet for Patients: EntrepreneurPulse.com.au  Fact Sheet for Healthcare Providers: IncredibleEmployment.be  This test is not yet approved or cleared by the [place] FDA and has been authorized for detection and/or diagnosis of SARS-CoV-2 by FDA under an Emergency Use Authorization (EUA). This EUA will remain in effect (meaning this test can be used) for the duration of the COVID-19 declaration under Section 564(b)(1) of the Act, 21 U.S.C. section 360bbb-3(b)(1), unless the authorization is terminated or revoked.  Performed at [facility], [place] [address] [place], [place] [address]   Surgical pcr screen     Status: None   Collection Time: 01/27/21 10:57 PM   Specimen: Nasal Mucosa; Nasal Swab  Result Value Ref Range Status   MRSA, PCR NEGATIVE NEGATIVE Final   Staphylococcus aureus NEGATIVE NEGATIVE Final    Comment: (NOTE) The Xpert SA Assay (FDA approved for NASAL specimens in patients 54 years of age and older), is one  component of a comprehensive surveillance program. It is not intended to diagnose infection nor to guide or monitor treatment. Performed at [facility], [place] [address], [place], [place] [address]      Time coordinating discharge: 35 minutes  SIGNED:   Cordelia Poche, MD [facility]  01/30/2021, 12:29 PM

## 2021-01-30 NOTE — Plan of Care (Signed)
Patient discharged home in stable condition 

## 2021-01-30 NOTE — Progress Notes (Signed)
Physical Therapy Treatment Patient Details Name: [name] MRN: [id number] DOB: [birth year] Today's Date: 01/30/2021    History of Present Illness 87 y.o. female presenting s/p mechanical fall on porch c/o L hip pain. Imaging (+) for L closed displaced femoral neck fx. Patient s/p cemented bipolar posterior hemiarthroplasty on 3/20 by Dr. Lorin Mercy. Posterior hip precautions and LLE WBAT. PMHx significant for arthritis, lumbago, osteoporosis and lumbar laminectomy ~1985.    PT Comments    Pt reports plan is now for home with family assisting.  Pt ambulated in hallway and practiced safe stair technique with daughter observing.  Therapist reviewed and demonstrated posterior hip precautions and also discussed maintaining precautions with mobility.  Pt and daughter report understanding and had no further questions.  Pt eager to d/c home today.    Follow Up Recommendations  Home health PT;Supervision for mobility/OOB     Equipment Recommendations  Rolling walker with 5" wheels (youth)    Recommendations for Other Services       Precautions / Restrictions Precautions Precautions: Posterior Hip Precaution Comments: Verbally reviewed and therapist demonstrated posterior hip precautions.  provided handout Restrictions LLE Weight Bearing: Weight bearing as tolerated    Mobility  Bed Mobility               General bed mobility comments: pt in recliner    Transfers Overall transfer level: Needs assistance Equipment used: Rolling walker (2 wheeled) Transfers: Sit to/from Stand Sit to Stand: Min guard         General transfer comment: min/guard for safety; cues for hand placement and Lt LE placement to assist with maintaining precautions  Ambulation/Gait Ambulation/Gait assistance: Min guard Gait Distance (Feet): 100 Feet Assistive device: Rolling walker (2 wheeled) Gait Pattern/deviations: Step-to pattern;Decreased weight shift to left;Antalgic     General Gait  Details: verbal cues for sequence and RW positioning, cues for maintaining precautions with turning   Stairs Stairs: Yes Stairs assistance: Min guard Stair Management: Step to pattern;Forwards;Two rails Number of Stairs: 2 General stair comments: verbal cues for safety and sequence; performed once with 2 rails and once with right rail and HHA on left; daughter present and observed   Wheelchair Mobility    Modified Rankin (Stroke Patients Only)       Balance                                            Cognition Arousal/Alertness: Awake/alert Behavior During Therapy: WFL for tasks assessed/performed Overall Cognitive Status: Within Functional Limits for tasks assessed                                        Exercises      General Comments        Pertinent Vitals/Pain Pain Assessment: 0-10 Pain Score: 5  Pain Location: left hip Pain Descriptors / Indicators: Sore Pain Intervention(s): Repositioned;Monitored during session;Ice applied    Home Living                      Prior Function            PT Goals (current goals can now be found in the care plan section) Progress towards PT goals: Progressing toward goals    Frequency    Min 3X/week  PT Plan Current plan remains appropriate;Discharge plan needs to be updated    Co-evaluation              AM-PAC PT "6 Clicks" Mobility   Outcome Measure  Help needed turning from your back to your side while in a flat bed without using bedrails?: A Little Help needed moving from lying on your back to sitting on the side of a flat bed without using bedrails?: A Little Help needed moving to and from a bed to a chair (including a wheelchair)?: A Little Help needed standing up from a chair using your arms (e.g., wheelchair or bedside chair)?: A Little Help needed to walk in hospital room?: A Little Help needed climbing 3-5 steps with a railing? : A Little 6 Click  Score: 18    End of Session Equipment Utilized During Treatment: Gait belt Activity Tolerance: Patient tolerated treatment well Patient left: in chair;with call bell/phone within reach;with chair alarm set;with family/visitor present   PT Visit Diagnosis: Other abnormalities of gait and mobility (R26.89)     Time: 1779-3903 PT Time Calculation (min) (ACUTE ONLY): 19 min  Charges:  $Gait Training: 8-22 mins                     Arlyce Dice, DPT Acute Rehabilitation Services Pager: [phone] Office: [phone]  [place] Ram E 01/30/2021, 3:29 PM

## 2021-01-30 NOTE — Progress Notes (Signed)
Inpatient Rehab Admissions Coordinator:  Spoke with pt via phone.  She said she is not interested in CIR.  She would prefer HH. TOC made aware.  Will sign off.   Gayland Curry, [place], [place] Admissions Coordinator [phone]

## 2021-01-30 NOTE — TOC Transition Note (Signed)
Transition of Care [facility]) - CM/SW Discharge Note  Patient Details  Name: [name] MRN: [id number] Date of Birth: 01/20/1933  Transition of Care [facility]) CM/SW Contact:  Sherie Don, LCSW Phone Number: 01/30/2021, 12:12 PM  Clinical Narrative: CSW met with patient and her daughter to discuss [facility] agencies. Daughter requested [facility] and declined [facility]. [facility] unable to provide OT at this time. Patient and daughter agreeable to referral to [facility]. CSW made referral to Amy with [facility]. Family updated. TOC signing off.  Final next level of care: [place] Barriers to Discharge: Barriers Resolved  Patient Goals and CMS Choice Patient states their goals for this hospitalization and ongoing recovery are:: Discharge home with [facility] services CMS Medicare.gov Compare Post Acute Care list provided to:: Patient Choice offered to / list presented to : [place]  Discharge Plan and Services In-house Referral: Clinical Social Work Post Acute Care Choice: Home Health          DME Arranged: N/A DME Agency: NA HH Arranged: PT,OT [place] Agency: [facility] Date HH Agency Contacted: 01/30/21 Time [place]: 1140 Representative spoke with at [place]: Amy  Readmission Risk Interventions No flowsheet data found.

## 2021-02-01 DIAGNOSIS — Z9181 History of falling: Secondary | ICD-10-CM | POA: Diagnosis not present

## 2021-02-01 DIAGNOSIS — S0101XD Laceration without foreign body of scalp, subsequent encounter: Secondary | ICD-10-CM | POA: Diagnosis not present

## 2021-02-01 DIAGNOSIS — E7800 Pure hypercholesterolemia, unspecified: Secondary | ICD-10-CM | POA: Diagnosis not present

## 2021-02-01 DIAGNOSIS — M810 Age-related osteoporosis without current pathological fracture: Secondary | ICD-10-CM | POA: Diagnosis not present

## 2021-02-01 DIAGNOSIS — S72002A Fracture of unspecified part of neck of left femur, initial encounter for closed fracture: Secondary | ICD-10-CM | POA: Diagnosis not present

## 2021-02-01 DIAGNOSIS — S72042D Displaced fracture of base of neck of left femur, subsequent encounter for closed fracture with routine healing: Secondary | ICD-10-CM | POA: Diagnosis not present

## 2021-02-01 DIAGNOSIS — D62 Acute posthemorrhagic anemia: Secondary | ICD-10-CM | POA: Diagnosis not present

## 2021-02-02 DIAGNOSIS — Z9181 History of falling: Secondary | ICD-10-CM | POA: Diagnosis not present

## 2021-02-02 DIAGNOSIS — S72042D Displaced fracture of base of neck of left femur, subsequent encounter for closed fracture with routine healing: Secondary | ICD-10-CM | POA: Diagnosis not present

## 2021-02-02 DIAGNOSIS — D62 Acute posthemorrhagic anemia: Secondary | ICD-10-CM | POA: Diagnosis not present

## 2021-02-02 DIAGNOSIS — S0101XD Laceration without foreign body of scalp, subsequent encounter: Secondary | ICD-10-CM | POA: Diagnosis not present

## 2021-02-02 DIAGNOSIS — E7800 Pure hypercholesterolemia, unspecified: Secondary | ICD-10-CM | POA: Diagnosis not present

## 2021-02-02 DIAGNOSIS — M810 Age-related osteoporosis without current pathological fracture: Secondary | ICD-10-CM | POA: Diagnosis not present

## 2021-02-05 ENCOUNTER — Telehealth: Payer: Self-pay | Admitting: Orthopaedic Surgery

## 2021-02-05 NOTE — Telephone Encounter (Signed)
I called and spoke with [name], patient's daughter. She states that patient came home from the hospital on Tuesday and was doing great. Unfortunately, she has had constant diarrhea which started Friday/Saturday.  She has been taking Imodium with no relief. [name] states that they have also been giving her Pedialyte, however, she noticed that diarrhea was a side effect. She would like to know if she is possibly getting too much Pedialyte?  She did eat really well yesterday.  [name] would like to know what you recommend in regards to the diarrhea?  She states if we call back and she isn't available, her sister Ivin Booty is there taking care of her mom and can answer all questions as well. Please advise.

## 2021-02-05 NOTE — Telephone Encounter (Signed)
Daughter called. Says patient has had diarrhea since last week. She is very weak. Would like someone to call her. [phone]

## 2021-02-05 NOTE — Telephone Encounter (Signed)
I called discussed. They will check for impaction.

## 2021-02-05 NOTE — Telephone Encounter (Signed)
noted 

## 2021-02-06 ENCOUNTER — Other Ambulatory Visit: Payer: Self-pay

## 2021-02-06 ENCOUNTER — Other Ambulatory Visit (HOSPITAL_COMMUNITY): Payer: Self-pay | Admitting: Family Medicine

## 2021-02-06 ENCOUNTER — Ambulatory Visit (HOSPITAL_COMMUNITY)
Admission: RE | Admit: 2021-02-06 | Discharge: 2021-02-06 | Disposition: A | Discharge status: Home / Self Care | Payer: Medicare Other | Source: Ambulatory Visit | Attending: Internal Medicine | Admitting: Internal Medicine

## 2021-02-06 DIAGNOSIS — D62 Acute posthemorrhagic anemia: Secondary | ICD-10-CM | POA: Diagnosis not present

## 2021-02-06 DIAGNOSIS — M79662 Pain in left lower leg: Secondary | ICD-10-CM | POA: Diagnosis not present

## 2021-02-06 DIAGNOSIS — E785 Hyperlipidemia, unspecified: Secondary | ICD-10-CM | POA: Diagnosis not present

## 2021-02-06 DIAGNOSIS — Z9181 History of falling: Secondary | ICD-10-CM | POA: Diagnosis not present

## 2021-02-06 DIAGNOSIS — W010XXA Fall on same level from slipping, tripping and stumbling without subsequent striking against object, initial encounter: Secondary | ICD-10-CM | POA: Diagnosis not present

## 2021-02-06 DIAGNOSIS — M810 Age-related osteoporosis without current pathological fracture: Secondary | ICD-10-CM | POA: Diagnosis not present

## 2021-02-06 DIAGNOSIS — S0101XA Laceration without foreign body of scalp, initial encounter: Secondary | ICD-10-CM | POA: Diagnosis not present

## 2021-02-06 DIAGNOSIS — S72002A Fracture of unspecified part of neck of left femur, initial encounter for closed fracture: Secondary | ICD-10-CM | POA: Diagnosis not present

## 2021-02-06 DIAGNOSIS — S0101XD Laceration without foreign body of scalp, subsequent encounter: Secondary | ICD-10-CM | POA: Diagnosis not present

## 2021-02-06 DIAGNOSIS — R600 Localized edema: Secondary | ICD-10-CM | POA: Diagnosis not present

## 2021-02-06 DIAGNOSIS — E7800 Pure hypercholesterolemia, unspecified: Secondary | ICD-10-CM | POA: Diagnosis not present

## 2021-02-06 DIAGNOSIS — S72042D Displaced fracture of base of neck of left femur, subsequent encounter for closed fracture with routine healing: Secondary | ICD-10-CM | POA: Diagnosis not present

## 2021-02-06 DIAGNOSIS — M7989 Other specified soft tissue disorders: Secondary | ICD-10-CM | POA: Diagnosis not present

## 2021-02-06 DIAGNOSIS — Z4802 Encounter for removal of sutures: Secondary | ICD-10-CM | POA: Diagnosis not present

## 2021-02-07 DIAGNOSIS — E7800 Pure hypercholesterolemia, unspecified: Secondary | ICD-10-CM | POA: Diagnosis not present

## 2021-02-07 DIAGNOSIS — S72042D Displaced fracture of base of neck of left femur, subsequent encounter for closed fracture with routine healing: Secondary | ICD-10-CM | POA: Diagnosis not present

## 2021-02-07 DIAGNOSIS — D62 Acute posthemorrhagic anemia: Secondary | ICD-10-CM | POA: Diagnosis not present

## 2021-02-07 DIAGNOSIS — Z9181 History of falling: Secondary | ICD-10-CM | POA: Diagnosis not present

## 2021-02-07 DIAGNOSIS — S0101XD Laceration without foreign body of scalp, subsequent encounter: Secondary | ICD-10-CM | POA: Diagnosis not present

## 2021-02-07 DIAGNOSIS — M810 Age-related osteoporosis without current pathological fracture: Secondary | ICD-10-CM | POA: Diagnosis not present

## 2021-02-08 DIAGNOSIS — Z9181 History of falling: Secondary | ICD-10-CM | POA: Diagnosis not present

## 2021-02-08 DIAGNOSIS — S72042D Displaced fracture of base of neck of left femur, subsequent encounter for closed fracture with routine healing: Secondary | ICD-10-CM | POA: Diagnosis not present

## 2021-02-08 DIAGNOSIS — S0101XD Laceration without foreign body of scalp, subsequent encounter: Secondary | ICD-10-CM | POA: Diagnosis not present

## 2021-02-08 DIAGNOSIS — M810 Age-related osteoporosis without current pathological fracture: Secondary | ICD-10-CM | POA: Diagnosis not present

## 2021-02-08 DIAGNOSIS — E7800 Pure hypercholesterolemia, unspecified: Secondary | ICD-10-CM | POA: Diagnosis not present

## 2021-02-08 DIAGNOSIS — D62 Acute posthemorrhagic anemia: Secondary | ICD-10-CM | POA: Diagnosis not present

## 2021-02-09 DIAGNOSIS — M810 Age-related osteoporosis without current pathological fracture: Secondary | ICD-10-CM | POA: Diagnosis not present

## 2021-02-09 DIAGNOSIS — E7800 Pure hypercholesterolemia, unspecified: Secondary | ICD-10-CM | POA: Diagnosis not present

## 2021-02-09 DIAGNOSIS — S0101XD Laceration without foreign body of scalp, subsequent encounter: Secondary | ICD-10-CM | POA: Diagnosis not present

## 2021-02-09 DIAGNOSIS — Z9181 History of falling: Secondary | ICD-10-CM | POA: Diagnosis not present

## 2021-02-09 DIAGNOSIS — S72042D Displaced fracture of base of neck of left femur, subsequent encounter for closed fracture with routine healing: Secondary | ICD-10-CM | POA: Diagnosis not present

## 2021-02-09 DIAGNOSIS — D62 Acute posthemorrhagic anemia: Secondary | ICD-10-CM | POA: Diagnosis not present

## 2021-02-12 DIAGNOSIS — E7800 Pure hypercholesterolemia, unspecified: Secondary | ICD-10-CM | POA: Diagnosis not present

## 2021-02-12 DIAGNOSIS — M810 Age-related osteoporosis without current pathological fracture: Secondary | ICD-10-CM | POA: Diagnosis not present

## 2021-02-12 DIAGNOSIS — Z9181 History of falling: Secondary | ICD-10-CM | POA: Diagnosis not present

## 2021-02-12 DIAGNOSIS — D62 Acute posthemorrhagic anemia: Secondary | ICD-10-CM | POA: Diagnosis not present

## 2021-02-12 DIAGNOSIS — S72042D Displaced fracture of base of neck of left femur, subsequent encounter for closed fracture with routine healing: Secondary | ICD-10-CM | POA: Diagnosis not present

## 2021-02-12 DIAGNOSIS — S0101XD Laceration without foreign body of scalp, subsequent encounter: Secondary | ICD-10-CM | POA: Diagnosis not present

## 2021-02-14 ENCOUNTER — Ambulatory Visit (INDEPENDENT_AMBULATORY_CARE_PROVIDER_SITE_OTHER): Payer: Medicare Other | Admitting: Orthopaedic Surgery

## 2021-02-14 ENCOUNTER — Encounter: Payer: Self-pay | Admitting: Orthopaedic Surgery

## 2021-02-14 ENCOUNTER — Ambulatory Visit: Payer: Self-pay

## 2021-02-14 VITALS — BP 130/65 | HR 80 | Ht 59.0 in | Wt 100.0 lb

## 2021-02-14 DIAGNOSIS — S72042D Displaced fracture of base of neck of left femur, subsequent encounter for closed fracture with routine healing: Secondary | ICD-10-CM | POA: Diagnosis not present

## 2021-02-14 DIAGNOSIS — M810 Age-related osteoporosis without current pathological fracture: Secondary | ICD-10-CM | POA: Diagnosis not present

## 2021-02-14 DIAGNOSIS — D62 Acute posthemorrhagic anemia: Secondary | ICD-10-CM | POA: Diagnosis not present

## 2021-02-14 DIAGNOSIS — E7800 Pure hypercholesterolemia, unspecified: Secondary | ICD-10-CM | POA: Diagnosis not present

## 2021-02-14 DIAGNOSIS — S72002A Fracture of unspecified part of neck of left femur, initial encounter for closed fracture: Secondary | ICD-10-CM

## 2021-02-14 DIAGNOSIS — S0101XD Laceration without foreign body of scalp, subsequent encounter: Secondary | ICD-10-CM | POA: Diagnosis not present

## 2021-02-14 DIAGNOSIS — Z9181 History of falling: Secondary | ICD-10-CM | POA: Diagnosis not present

## 2021-02-14 NOTE — Progress Notes (Signed)
   Post-Op Visit Note   Patient: [name]           Date of Birth: 03/24/1933           MRN: [id number] Visit Date: 02/14/2021 PCP: Crist Infante, MD   Assessment & Plan:post cememnted left bipolar  Chief Complaint:  Chief Complaint  Patient presents with  . Left Hip - Routine Post Op    01/28/2021 left hip hemi   Visit Diagnoses:  1. Closed fracture of neck of left femur, initial encounter ([place])     Plan: ROV 6 wks  Follow-Up Instructions: Return in about 6 weeks (around 03/28/2021).   Orders:  Orders Placed This Encounter  Procedures  . XR HIP UNILAT W OR W/O PELVIS 2-3 VIEWS LEFT   No orders of the defined types were placed in this encounter.   Imaging: No results found.  PMFS History: Patient Active Problem List   Diagnosis Date Noted  . Closed left hip fracture, initial encounter ([name]) 01/27/2021  . Scalp laceration   . Palpitations 12/04/2012  . Chest pain 11/20/2012  . DJD (degenerative joint disease) 11/22/2011  . NECK PAIN 03/01/2008  . BACK PAIN, LUMBAR 03/01/2008  . COLONIC POLYPS 02/29/2008  . HYPERCHOLESTEROLEMIA 02/29/2008  . Osteoporosis 02/29/2008   Past Medical History:  Diagnosis Date  . Arthritis   . Cataract   . Hx of colonic polyps   . Hypercholesterolemia   . Lumbar back pain   . Neck pain   . Osteoporosis   . Swelling of ankle 01/12/16   bilateral. Pt is taking lasix x 2 weeks and wearing compression stockings    Family History  Problem Relation Age of Onset  . Stroke Mother   . Heart Problems Sister        triple bipass  . Heart Problems Brother        blocked arteries, stent.  . Stomach cancer Neg Hx   . Colon cancer Neg Hx   . Pancreatic cancer Neg Hx   . Rectal cancer Neg Hx   . Colon polyps Neg Hx   . Esophageal cancer Neg Hx     Past Surgical History:  Procedure Laterality Date  . ABDOMINAL HYSTERECTOMY  12/2003   DR. Neal--bso, A/P repair  . cataract surgery    . COLONOSCOPY    . HIP ARTHROPLASTY Left  01/28/2021   Procedure: ARTHROPLASTY BIPOLAR HIP (HEMIARTHROPLASTY);  Surgeon: Selena Killings, MD;  Location: [facility];  Service: Orthopedics;  Laterality: Left;  depuy, Lateral position with Tiara M[place]sby II  . LUMBAR LAMINECTOMY  1985   Dr. Gladstone [name]  . POLYPECTOMY    . surgery for removal of rectal polyp  07/1995   Dr. Modena [name]  . TUBAL LIGATION     Social History   Occupational History  . Occupation: retired  Tobacco Use  . Smoking status: Former Smoker    Packs/day: 0.25    Years: 1.00    Pack years: 0.25    Types: Cigarettes    Quit date: 11/11/1954    Years since quitting: 66.3  . Smokeless tobacco: Never Used  Vaping Use  . Vaping Use: Never used  Substance and Sexual Activity  . Alcohol use: Yes    Alcohol/week: 7.0 standard drinks    Types: 7 Standard drinks or equivalent per week    Comment: wine with dinner  . Drug use: No  . Sexual activity: Never    Birth control/protection: Abstinence

## 2021-02-19 DIAGNOSIS — E7800 Pure hypercholesterolemia, unspecified: Secondary | ICD-10-CM | POA: Diagnosis not present

## 2021-02-19 DIAGNOSIS — S72042D Displaced fracture of base of neck of left femur, subsequent encounter for closed fracture with routine healing: Secondary | ICD-10-CM | POA: Diagnosis not present

## 2021-02-19 DIAGNOSIS — S0101XD Laceration without foreign body of scalp, subsequent encounter: Secondary | ICD-10-CM | POA: Diagnosis not present

## 2021-02-19 DIAGNOSIS — M810 Age-related osteoporosis without current pathological fracture: Secondary | ICD-10-CM | POA: Diagnosis not present

## 2021-02-19 DIAGNOSIS — Z9181 History of falling: Secondary | ICD-10-CM | POA: Diagnosis not present

## 2021-02-19 DIAGNOSIS — D62 Acute posthemorrhagic anemia: Secondary | ICD-10-CM | POA: Diagnosis not present

## 2021-02-20 ENCOUNTER — Ambulatory Visit (HOSPITAL_COMMUNITY)
Admission: RE | Admit: 2021-02-20 | Discharge: 2021-02-20 | Disposition: A | Discharge status: Home / Self Care | Payer: Medicare Other | Source: Ambulatory Visit | Attending: Internal Medicine | Admitting: Internal Medicine

## 2021-02-20 ENCOUNTER — Other Ambulatory Visit: Payer: Self-pay

## 2021-02-20 DIAGNOSIS — M810 Age-related osteoporosis without current pathological fracture: Secondary | ICD-10-CM | POA: Insufficient documentation

## 2021-02-20 MED ORDER — DENOSUMAB 60 MG/ML ~~LOC~~ SOSY
PREFILLED_SYRINGE | SUBCUTANEOUS | Status: AC
  Filled 2021-02-20: qty 1

## 2021-02-20 MED ORDER — DENOSUMAB 60 MG/ML ~~LOC~~ SOSY
60.0000 mg | PREFILLED_SYRINGE | Freq: Once | SUBCUTANEOUS | Status: AC
  Administered 2021-02-20: 60 mg via SUBCUTANEOUS

## 2021-02-27 DIAGNOSIS — M810 Age-related osteoporosis without current pathological fracture: Secondary | ICD-10-CM | POA: Diagnosis not present

## 2021-02-27 DIAGNOSIS — E7800 Pure hypercholesterolemia, unspecified: Secondary | ICD-10-CM | POA: Diagnosis not present

## 2021-02-27 DIAGNOSIS — D62 Acute posthemorrhagic anemia: Secondary | ICD-10-CM | POA: Diagnosis not present

## 2021-02-27 DIAGNOSIS — S0101XD Laceration without foreign body of scalp, subsequent encounter: Secondary | ICD-10-CM | POA: Diagnosis not present

## 2021-02-27 DIAGNOSIS — Z9181 History of falling: Secondary | ICD-10-CM | POA: Diagnosis not present

## 2021-02-27 DIAGNOSIS — S72042D Displaced fracture of base of neck of left femur, subsequent encounter for closed fracture with routine healing: Secondary | ICD-10-CM | POA: Diagnosis not present

## 2021-03-02 DIAGNOSIS — M810 Age-related osteoporosis without current pathological fracture: Secondary | ICD-10-CM | POA: Diagnosis not present

## 2021-03-02 DIAGNOSIS — S0101XD Laceration without foreign body of scalp, subsequent encounter: Secondary | ICD-10-CM | POA: Diagnosis not present

## 2021-03-02 DIAGNOSIS — Z9181 History of falling: Secondary | ICD-10-CM | POA: Diagnosis not present

## 2021-03-02 DIAGNOSIS — S72042D Displaced fracture of base of neck of left femur, subsequent encounter for closed fracture with routine healing: Secondary | ICD-10-CM | POA: Diagnosis not present

## 2021-03-02 DIAGNOSIS — D62 Acute posthemorrhagic anemia: Secondary | ICD-10-CM | POA: Diagnosis not present

## 2021-03-02 DIAGNOSIS — E7800 Pure hypercholesterolemia, unspecified: Secondary | ICD-10-CM | POA: Diagnosis not present

## 2021-03-28 ENCOUNTER — Ambulatory Visit (INDEPENDENT_AMBULATORY_CARE_PROVIDER_SITE_OTHER): Payer: Medicare Other | Admitting: Orthopaedic Surgery

## 2021-03-28 ENCOUNTER — Other Ambulatory Visit: Payer: Self-pay

## 2021-03-28 ENCOUNTER — Ambulatory Visit (INDEPENDENT_AMBULATORY_CARE_PROVIDER_SITE_OTHER): Payer: Medicare Other

## 2021-03-28 ENCOUNTER — Encounter: Payer: Self-pay | Admitting: Orthopaedic Surgery

## 2021-03-28 VITALS — BP 146/74 | HR 86 | Ht 59.0 in | Wt 106.0 lb

## 2021-03-28 DIAGNOSIS — S72002A Fracture of unspecified part of neck of left femur, initial encounter for closed fracture: Secondary | ICD-10-CM

## 2021-03-28 DIAGNOSIS — M25562 Pain in left knee: Secondary | ICD-10-CM

## 2021-03-29 NOTE — Progress Notes (Signed)
Post-Op Visit Note   Patient: [name]           Date of Birth: 1933-10-21           MRN: [id number] Visit Date: 03/28/2021 PCP: Crist Infante, MD   Assessment & Plan: Patient returns post left hip cemented hemiarthroplasty for femoral neck fracture.  Patient is ambulatory without a limp.  She occasionally has some discomfort in the distal thigh about the knee.  She is noted some residual swelling in her left lower extremity particularly at the end of the day.  No anterior groin pain she is able ambulate during the day with no discomfort.  Chief Complaint:  Chief Complaint  Patient presents with  . Left Hip - Follow-up    01/28/2021 left hip hemiarthroplasty   Visit Diagnoses:  1. Closed fracture of neck of left femur, initial encounter ([place])   2. Acute pain of left knee     Plan: X-ray shows some medial compartment arthritis right and left knee.  She can continue working on increasing her distance walking for strength and stamina.  Office follow-up as needed.  Follow-Up Instructions: Return if symptoms worsen or fail to improve.   Orders:  Orders Placed This Encounter  Procedures  . XR HIP UNILAT W OR W/O PELVIS 2-3 VIEWS LEFT  . XR Knee 1-2 Views Left   No orders of the defined types were placed in this encounter.   Imaging: No results found.  PMFS History: Patient Active Problem List   Diagnosis Date Noted  . Closed left hip fracture, initial encounter ([place]) 01/27/2021  . Scalp laceration   . Palpitations 12/04/2012  . Chest pain 11/20/2012  . DJD (degenerative joint disease) 11/22/2011  . NECK PAIN 03/01/2008  . BACK PAIN, LUMBAR 03/01/2008  . COLONIC POLYPS 02/29/2008  . HYPERCHOLESTEROLEMIA 02/29/2008  . Osteoporosis 02/29/2008   Past Medical History:  Diagnosis Date  . Arthritis   . Cataract   . Hx of colonic polyps   . Hypercholesterolemia   . Lumbar back pain   . Neck pain   . Osteoporosis   . Swelling of ankle 01/12/16   bilateral. Pt is  taking lasix x 2 weeks and wearing compression stockings    Family History  Problem Relation Age of Onset  . Stroke Mother   . Heart Problems Sister        triple bipass  . Heart Problems Brother        blocked arteries, stent.  . Stomach cancer Neg Hx   . Colon cancer Neg Hx   . Pancreatic cancer Neg Hx   . Rectal cancer Neg Hx   . Colon polyps Neg Hx   . Esophageal cancer Neg Hx     Past Surgical History:  Procedure Laterality Date  . ABDOMINAL HYSTERECTOMY  12/2003   DR. Neal--bso, A/P repair  . cataract surgery    . COLONOSCOPY    . HIP ARTHROPLASTY Left 01/28/2021   Procedure: ARTHROPLASTY BIPOLAR HIP (HEMIARTHROPLASTY);  Surgeon: [name], MD;  Location: [facility];  Service: Orthopedics;  Laterality: Left;  depuy, Lateral position with Angy Swearengin II  . LUMBAR LAMINECTOMY  1985   Dr. Gladstone Lighter  . POLYPECTOMY    . surgery for removal of rectal polyp  07/1995   Dr. Modena Morrow  . TUBAL LIGATION     Social History   Occupational History  . Occupation: retired  Tobacco Use  . Smoking status: Former Smoker    Packs/day:  0.25    Years: 1.00    Pack years: 0.25    Types: Cigarettes    Quit date: 11/11/1954    Years since quitting: 66.4  . Smokeless tobacco: Never Used  Vaping Use  . Vaping Use: Never used  Substance and Sexual Activity  . Alcohol use: Yes    Alcohol/week: 7.0 standard drinks    Types: 7 Standard drinks or equivalent per week    Comment: wine with dinner  . Drug use: No  . Sexual activity: Never    Birth control/protection: Abstinence

## 2021-04-16 ENCOUNTER — Other Ambulatory Visit (HOSPITAL_COMMUNITY): Payer: Self-pay | Admitting: Internal Medicine

## 2021-04-16 ENCOUNTER — Other Ambulatory Visit: Payer: Self-pay

## 2021-04-16 ENCOUNTER — Ambulatory Visit (HOSPITAL_COMMUNITY)
Admission: RE | Admit: 2021-04-16 | Discharge: 2021-04-16 | Disposition: A | Discharge status: Home / Self Care | Payer: Medicare Other | Source: Ambulatory Visit | Attending: Internal Medicine | Admitting: Internal Medicine

## 2021-04-16 DIAGNOSIS — S72002A Fracture of unspecified part of neck of left femur, initial encounter for closed fracture: Secondary | ICD-10-CM | POA: Diagnosis not present

## 2021-04-16 DIAGNOSIS — M79662 Pain in left lower leg: Secondary | ICD-10-CM | POA: Diagnosis not present

## 2021-04-16 DIAGNOSIS — M7989 Other specified soft tissue disorders: Secondary | ICD-10-CM

## 2021-04-16 DIAGNOSIS — M810 Age-related osteoporosis without current pathological fracture: Secondary | ICD-10-CM | POA: Diagnosis not present

## 2021-04-16 DIAGNOSIS — R600 Localized edema: Secondary | ICD-10-CM | POA: Diagnosis not present

## 2021-04-16 DIAGNOSIS — I872 Venous insufficiency (chronic) (peripheral): Secondary | ICD-10-CM | POA: Diagnosis not present

## 2021-04-16 DIAGNOSIS — E785 Hyperlipidemia, unspecified: Secondary | ICD-10-CM | POA: Diagnosis not present

## 2021-04-18 ENCOUNTER — Other Ambulatory Visit: Payer: Self-pay | Admitting: Internal Medicine

## 2021-04-20 ENCOUNTER — Other Ambulatory Visit: Payer: Self-pay | Admitting: Internal Medicine

## 2021-04-20 DIAGNOSIS — R1031 Right lower quadrant pain: Secondary | ICD-10-CM

## 2021-04-27 DIAGNOSIS — S72002A Fracture of unspecified part of neck of left femur, initial encounter for closed fracture: Secondary | ICD-10-CM | POA: Diagnosis not present

## 2021-04-27 DIAGNOSIS — Z0000 Encounter for general adult medical examination without abnormal findings: Secondary | ICD-10-CM | POA: Diagnosis not present

## 2021-04-27 DIAGNOSIS — Z780 Asymptomatic menopausal state: Secondary | ICD-10-CM | POA: Diagnosis not present

## 2021-04-27 DIAGNOSIS — E785 Hyperlipidemia, unspecified: Secondary | ICD-10-CM | POA: Diagnosis not present

## 2021-04-27 DIAGNOSIS — M810 Age-related osteoporosis without current pathological fracture: Secondary | ICD-10-CM | POA: Diagnosis not present

## 2021-05-07 ENCOUNTER — Ambulatory Visit
Admission: RE | Admit: 2021-05-07 | Discharge: 2021-05-07 | Disposition: A | Discharge status: Home / Self Care | Payer: Medicare Other | Source: Ambulatory Visit | Attending: Internal Medicine | Admitting: Internal Medicine

## 2021-05-07 ENCOUNTER — Other Ambulatory Visit: Payer: Self-pay

## 2021-05-07 DIAGNOSIS — K769 Liver disease, unspecified: Secondary | ICD-10-CM | POA: Diagnosis not present

## 2021-05-07 DIAGNOSIS — K449 Diaphragmatic hernia without obstruction or gangrene: Secondary | ICD-10-CM | POA: Diagnosis not present

## 2021-05-07 DIAGNOSIS — M7989 Other specified soft tissue disorders: Secondary | ICD-10-CM | POA: Diagnosis not present

## 2021-05-07 DIAGNOSIS — R1031 Right lower quadrant pain: Secondary | ICD-10-CM

## 2021-05-07 DIAGNOSIS — K828 Other specified diseases of gallbladder: Secondary | ICD-10-CM | POA: Diagnosis not present

## 2021-05-07 MED ORDER — IOPAMIDOL (ISOVUE-300) INJECTION 61%
100.0000 mL | Freq: Once | INTRAVENOUS | Status: AC | PRN
  Administered 2021-05-07: 100 mL via INTRAVENOUS

## 2021-05-11 DIAGNOSIS — R600 Localized edema: Secondary | ICD-10-CM | POA: Diagnosis not present

## 2021-05-11 DIAGNOSIS — Z0000 Encounter for general adult medical examination without abnormal findings: Secondary | ICD-10-CM | POA: Diagnosis not present

## 2021-05-11 DIAGNOSIS — M810 Age-related osteoporosis without current pathological fracture: Secondary | ICD-10-CM | POA: Diagnosis not present

## 2021-05-11 DIAGNOSIS — K4090 Unilateral inguinal hernia, without obstruction or gangrene, not specified as recurrent: Secondary | ICD-10-CM | POA: Diagnosis not present

## 2021-05-11 DIAGNOSIS — R82998 Other abnormal findings in urine: Secondary | ICD-10-CM | POA: Diagnosis not present

## 2021-05-11 DIAGNOSIS — E785 Hyperlipidemia, unspecified: Secondary | ICD-10-CM | POA: Diagnosis not present

## 2021-06-06 ENCOUNTER — Encounter (HOSPITAL_COMMUNITY): Payer: Medicare Other

## 2021-07-03 DIAGNOSIS — K4090 Unilateral inguinal hernia, without obstruction or gangrene, not specified as recurrent: Secondary | ICD-10-CM | POA: Diagnosis not present

## 2021-08-09 ENCOUNTER — Encounter: Payer: Self-pay | Admitting: Podiatry

## 2021-08-09 ENCOUNTER — Other Ambulatory Visit: Payer: Self-pay

## 2021-08-09 ENCOUNTER — Ambulatory Visit (INDEPENDENT_AMBULATORY_CARE_PROVIDER_SITE_OTHER): Payer: Medicare Other | Admitting: Podiatry

## 2021-08-09 DIAGNOSIS — M21619 Bunion of unspecified foot: Secondary | ICD-10-CM

## 2021-08-09 DIAGNOSIS — D62 Acute posthemorrhagic anemia: Secondary | ICD-10-CM | POA: Insufficient documentation

## 2021-08-09 DIAGNOSIS — M2042 Other hammer toe(s) (acquired), left foot: Secondary | ICD-10-CM | POA: Diagnosis not present

## 2021-08-09 DIAGNOSIS — Z4802 Encounter for removal of sutures: Secondary | ICD-10-CM | POA: Insufficient documentation

## 2021-08-09 DIAGNOSIS — W010XXA Fall on same level from slipping, tripping and stumbling without subsequent striking against object, initial encounter: Secondary | ICD-10-CM | POA: Insufficient documentation

## 2021-08-09 NOTE — Progress Notes (Signed)
  Subjective:  Patient ID: [name], female    DOB: Aug 06, 1933,   MRN: [id number]  Chief Complaint  Patient presents with   Hammer Toe    Left 2nd toe "I discussed a toe splint with Dr. Paulla Dolly but we did not end up getting one"    87 y.o. female presents for pain in her left second toe. Previously seen by Dr. Paulla Dolly and had injections for the toe. States the rubbing between her second and first toe really bother her. States she does not want any surgery.  . Denies any other pedal complaints. Denies n/v/f/c.   Past Medical History:  Diagnosis Date   Arthritis    Cataract    Hx of colonic polyps    Hypercholesterolemia    Lumbar back pain    Neck pain    Osteoporosis    Swelling of ankle 01/12/16   bilateral. Pt is taking lasix x 2 weeks and wearing compression stockings    Objective:  Physical Exam: Vascular: DP/PT pulses 2/4 bilateral. CFT <3 seconds. Normal hair growth on digits. No edema.  Skin. No lacerations or abrasions bilateral feet.  Musculoskeletal: MMT 5/5 bilateral lower extremities in DF, PF, Inversion and Eversion. Deceased ROM in DF of ankle joint. Bunion deformity left foot with hammered second digit. Tenderness to left second hammertoe.  Neurological: Sensation intact to light touch.   Assessment:   1. Hammer toe of left foot   2. Bunion      Plan:  Patient was evaluated and treated and all questions answered. -Educated on hammertoes and treatment options  -Discussed padding including toe caps and crest pads. Discussing options for taping as well.  -Discussed need for potential surgery if pain does not improved. Although at this point patient in agreement that surgery should be avoided.  -Patient to follow-up as needed. Discussed calling if any changes or increased pain.    Lorenda Peck, DPM

## 2021-09-04 DIAGNOSIS — Z23 Encounter for immunization: Secondary | ICD-10-CM | POA: Diagnosis not present

## 2021-09-26 ENCOUNTER — Other Ambulatory Visit: Payer: Self-pay

## 2021-09-26 MED ORDER — EZETIMIBE 10 MG PO TABS: 10.0000 mg | ORAL_TABLET | Freq: Every day | ORAL | 0 refills | Status: DC

## 2021-10-09 ENCOUNTER — Encounter: Payer: Self-pay | Admitting: Cardiovascular Disease

## 2021-10-09 ENCOUNTER — Ambulatory Visit (INDEPENDENT_AMBULATORY_CARE_PROVIDER_SITE_OTHER): Payer: Medicare Other | Admitting: Cardiovascular Disease

## 2021-10-09 ENCOUNTER — Other Ambulatory Visit: Payer: Self-pay

## 2021-10-09 VITALS — BP 140/80 | HR 59 | Ht 59.0 in | Wt 104.8 lb

## 2021-10-09 DIAGNOSIS — I350 Nonrheumatic aortic (valve) stenosis: Secondary | ICD-10-CM | POA: Diagnosis not present

## 2021-10-09 DIAGNOSIS — E782 Mixed hyperlipidemia: Secondary | ICD-10-CM

## 2021-10-09 NOTE — Progress Notes (Signed)
Cardiology Office Note:    Date:  10/09/2021   ID:  [name], DOB 1933/06/17, MRN [id number]  PCP:  Crist Infante, MD   [facility] Providers Cardiologist:  Sherren Mocha, MD     Referring MD: Crist Infante, MD   Chief Complaint  Patient presents with   Follow-up    Hyperlipidemia     History of Present Illness:    [name] is a 87 y.o. female with a hx of mixed hyperlipidemia, presenting for follow-up evaluation.  She has been reluctant to take statin drugs over the years.  She has been treated with ezetimibe, but since I have seen her last she has been started on rosuvastatin 5 mg daily.  The patient has chronic left leg swelling from lymphedema, unchanged over time.  At the time of last year's exam, heart murmur was noted and an echocardiogram was performed, demonstrating mild aortic stenosis.  The patient is here alone today. She reports that she is doing well at present. She had a mechanical fall and sustained a hip fracture in March of this year, but has had a good recovery.  She did not have any cardiac-related problems postoperatively.  She denies chest pain, chest pressure, or shortness of breath.  She has not been doing as much walking and physical activity as she had in the past since her hip fracture.  Otherwise she is doing well at present.  Past Medical History:  Diagnosis Date   Arthritis    Cataract    Hx of colonic polyps    Hypercholesterolemia    Lumbar back pain    Neck pain    Osteoporosis    Swelling of ankle 01/12/16   bilateral. Pt is taking lasix x 2 weeks and wearing compression stockings    Past Surgical History:  Procedure Laterality Date   ABDOMINAL HYSTERECTOMY  12/2003   DR. Neal--bso, A/P repair   cataract surgery     COLONOSCOPY     HIP ARTHROPLASTY Left 01/28/2021   Procedure: ARTHROPLASTY BIPOLAR HIP (HEMIARTHROPLASTY);  Surgeon: [name], MD;  Location: [facility];  Service: Orthopedics;  Laterality: Left;  depuy,  Lateral position with Mark II   LUMBAR LAMINECTOMY  1985   Dr. Gladstone Lighter   POLYPECTOMY     surgery for removal of rectal polyp  07/1995   Dr. Modena Morrow   TUBAL LIGATION      Current Medications: Current Meds  Medication Sig   BD PEN NEEDLE NANO 2ND GEN 32G X 4 MM MISC SMARTSIG:Pre-Filled Pen Syringe SUB-Q Daily   Calcium Carbonate-Vitamin D (CALCIUM-D PO) Take 1 tablet by mouth 2 (two) times daily.   Cholecalciferol (VITAMIN D3 PO) Take 1 tablet by mouth daily.   docusate sodium (COLACE) 100 MG capsule Take 1 capsule (100 mg total) by mouth 2 (two) times daily.   ezetimibe (ZETIA) 10 MG tablet Take 1 tablet (10 mg total) by mouth daily. Please keep upcoming appt in November 2022 with Dr. Burt Knack before anymore refills. Thank you   FORTEO 600 MCG/2.4ML SOPN Inject into the skin.   Glucosamine-Chondroitin (MOVE FREE PO) Take 1 tablet by mouth daily.   Multiple Vitamin (MULTIVITAMIN WITH MINERALS) TABS tablet Take 1 tablet by mouth daily.   rosuvastatin (CRESTOR) 5 MG tablet Take 5 mg by mouth daily.   TURMERIC PO Take 1 capsule by mouth 2 (two) times daily.     Allergies:   Risedronate sodium, Cephalexin, Oxycodone, and Hydrocodone   Social History  Socioeconomic History   Marital status: Widowed    Spouse name: Not on file   Number of children: 7   Years of education: Not on file   Highest education level: Not on file  Occupational History   Occupation: retired  Tobacco Use   Smoking status: Former    Packs/day: 0.25    Years: 1.00    Pack years: 0.25    Types: Cigarettes    Quit date: 11/11/1954    Years since quitting: 66.9   Smokeless tobacco: Never  Vaping Use   Vaping Use: Never used  Substance and Sexual Activity   Alcohol use: Yes    Alcohol/week: 7.0 standard drinks    Types: 7 Standard drinks or equivalent per week    Comment: wine with dinner   Drug use: No   Sexual activity: Never    Birth control/protection: Abstinence  Other Topics Concern   Not on file   Social History Narrative   Not on file   Social Determinants of Health   Financial Resource Strain: Not on file  Food Insecurity: Not on file  Transportation Needs: Not on file  Physical Activity: Not on file  Stress: Not on file  Social Connections: Not on file     Family History: The patient's family history includes Heart Problems in her brother and sister; Stroke in her mother. There is no history of Stomach cancer, Colon cancer, Pancreatic cancer, Rectal cancer, Colon polyps, or Esophageal cancer.  ROS:   Please see the history of present illness.    All other systems reviewed and are negative.  EKGs/Labs/Other Studies Reviewed:    The following studies were reviewed today: Echo 11/01/2020:  1. Left ventricular ejection fraction, by estimation, is 60 to 65%. The  left ventricle has normal function. The left ventricle has no regional  wall motion abnormalities.   2. Left ventricular diastolic parameters are consistent with Grade I  diastolic dysfunction (impaired relaxation).   3. The average left ventricular global longitudinal strain is -20.3 %.  The global longitudinal strain is normal.   4. Right ventricular systolic function is normal. The right ventricular  size is normal. There is normal pulmonary artery systolic pressure.   5. The mitral valve is degenerative. Mild mitral valve regurgitation. No  evidence of mitral stenosis. Moderate mitral annular calcification.   6. The aortic valve is calcified. There is moderate-to-severe  calcification of the aortic valve. There is moderate-to-severe thickening  of the aortic valve. Aortic valve regurgitation is mild. Mild aortic valve  stenosis.   7. The inferior vena cava is normal in size with greater than 50%  respiratory variability, suggesting right atrial pressure of 3 mmHg.   EKG:  EKG is ordered today.  The ekg ordered today demonstrates normal sinus rhythm 59 bpm, within normal limits.  Recent Labs: 01/30/2021:  BUN 16; Creatinine, Ser 0.68; Hemoglobin 9.5; Platelets 169; Potassium 3.6; Sodium 134  Recent Lipid Panel    Component Value Date/Time   CHOL 182 09/28/2018 0729   TRIG 53 09/28/2018 0729   HDL 63 09/28/2018 0729   CHOLHDL 2.9 09/28/2018 0729   CHOLHDL 2.3 04/19/2016 0736   VLDL 10 04/19/2016 0736   LDLCALC 108 (H) 09/28/2018 0729   LDLDIRECT 158.8 11/14/2011 0733     Risk Assessment/Calculations:           Physical Exam:    VS:  BP 140/80   Pulse (!) 59   Ht 4\' 11"  (1.499 m)   Wt  104 lb 12.8 oz (47.5 kg)   SpO2 97%   BMI 21.17 kg/m     Wt Readings from Last 3 Encounters:  10/09/21 104 lb 12.8 oz (47.5 kg)  03/28/21 106 lb (48.1 kg)  02/14/21 100 lb (45.4 kg)     GEN:  Well nourished, well developed in no acute distress HEENT: Normal NECK: No JVD; No carotid bruits LYMPHATICS: No lymphadenopathy CARDIAC: RRR, 2/6 harsh early peaking systolic murmur at the right upper sternal border RESPIRATORY:  Clear to auscultation without rales, wheezing or rhonchi  ABDOMEN: Soft, non-tender, non-distended MUSCULOSKELETAL: 1+ left pretibial edema; No deformity  SKIN: Warm and dry NEUROLOGIC:  Alert and oriented x 3 PSYCHIATRIC:  Normal affect   ASSESSMENT:    1. Nonrheumatic aortic valve stenosis   2. Mixed hyperlipidemia    PLAN:    In order of problems listed above:  The patient's exam remains consistent with mild aortic stenosis.  She has no symptoms related to this.  I discussed the natural history of aortic stenosis with her today.  She understands that this is generally a slow progressing problem.  I would like her to have a follow-up echocardiogram prior to her office visit next year.  This will be a 2-year interval from her previous echo. Now treated with a low-dose of rosuvastatin which she seems to be tolerating well.  Also on Zetia.  Scheduled for follow-up labs with Dr. Joylene Draft.           Medication Adjustments/Labs and Tests Ordered: Current medicines  are reviewed at length with the patient today.  Concerns regarding medicines are outlined above.  Orders Placed This Encounter  Procedures   EKG 12-Lead   EKG 12-Lead   ECHOCARDIOGRAM COMPLETE    No orders of the defined types were placed in this encounter.   Patient Instructions  Medication Instructions:  No changes *If you need a refill on your cardiac medications before your next appointment, please call your pharmacy*   Lab Work: none   Testing/Procedures: ECHO IN A YEAR BEFORE NEXT VISIT  Your physician has requested that you have an echocardiogram. Echocardiography is a painless test that uses sound waves to create images of your heart. It provides your doctor with information about the size and shape of your heart and how well your heart's chambers and valves are working. This procedure takes approximately one hour. There are no restrictions for this procedure.  Follow-Up: At [facility], you and your health needs are our priority.  As part of our continuing mission to provide you with exceptional heart care, we have created designated Provider Care Teams.  These Care Teams include your primary Cardiologist (physician) and Advanced Practice Providers (APPs -  Physician Assistants and Nurse Practitioners) who all work together to provide you with the care you need, when you need it.  Your next appointment:   12 month(s)  The format for your next appointment:   In Person  Provider:   Sherren Mocha, MD     Other Instructions     Signed, Sherren Mocha, MD  10/09/2021 1:50 PM    [place]

## 2021-10-09 NOTE — Patient Instructions (Addendum)
Medication Instructions:  No changes *If you need a refill on your cardiac medications before your next appointment, please call your pharmacy*   Lab Work: none   Testing/Procedures: ECHO IN A YEAR BEFORE NEXT VISIT  Your physician has requested that you have an echocardiogram. Echocardiography is a painless test that uses sound waves to create images of your heart. It provides your doctor with information about the size and shape of your heart and how well your heart's chambers and valves are working. This procedure takes approximately one hour. There are no restrictions for this procedure.  Follow-Up: At [facility], you and your health needs are our priority.  As part of our continuing mission to provide you with exceptional heart care, we have created designated Provider Care Teams.  These Care Teams include your primary Cardiologist (physician) and Advanced Practice Providers (APPs -  Physician Assistants and Nurse Practitioners) who all work together to provide you with the care you need, when you need it.  Your next appointment:   12 month(s)  The format for your next appointment:   In Person  Provider:   Sherren Mocha, MD     Other Instructions

## 2021-11-09 DIAGNOSIS — I872 Venous insufficiency (chronic) (peripheral): Secondary | ICD-10-CM | POA: Diagnosis not present

## 2021-11-09 DIAGNOSIS — F4320 Adjustment disorder, unspecified: Secondary | ICD-10-CM | POA: Diagnosis not present

## 2021-11-09 DIAGNOSIS — J309 Allergic rhinitis, unspecified: Secondary | ICD-10-CM | POA: Diagnosis not present

## 2021-11-09 DIAGNOSIS — E785 Hyperlipidemia, unspecified: Secondary | ICD-10-CM | POA: Diagnosis not present

## 2021-11-09 DIAGNOSIS — D62 Acute posthemorrhagic anemia: Secondary | ICD-10-CM | POA: Diagnosis not present

## 2021-11-09 DIAGNOSIS — M810 Age-related osteoporosis without current pathological fracture: Secondary | ICD-10-CM | POA: Diagnosis not present

## 2021-11-26 DIAGNOSIS — Z124 Encounter for screening for malignant neoplasm of cervix: Secondary | ICD-10-CM | POA: Diagnosis not present

## 2021-11-26 DIAGNOSIS — Z6821 Body mass index (BMI) 21.0-21.9, adult: Secondary | ICD-10-CM | POA: Diagnosis not present

## 2021-11-26 DIAGNOSIS — Z1231 Encounter for screening mammogram for malignant neoplasm of breast: Secondary | ICD-10-CM | POA: Diagnosis not present

## 2021-11-26 DIAGNOSIS — Z1272 Encounter for screening for malignant neoplasm of vagina: Secondary | ICD-10-CM | POA: Diagnosis not present

## 2021-12-25 ENCOUNTER — Other Ambulatory Visit: Payer: Self-pay

## 2021-12-25 MED ORDER — EZETIMIBE 10 MG PO TABS: 10.0000 mg | ORAL_TABLET | Freq: Every day | ORAL | 3 refills | Status: DC

## 2022-01-09 DIAGNOSIS — D485 Neoplasm of uncertain behavior of skin: Secondary | ICD-10-CM | POA: Diagnosis not present

## 2022-01-09 DIAGNOSIS — L820 Inflamed seborrheic keratosis: Secondary | ICD-10-CM | POA: Diagnosis not present

## 2022-01-09 DIAGNOSIS — D692 Other nonthrombocytopenic purpura: Secondary | ICD-10-CM | POA: Diagnosis not present

## 2022-01-09 DIAGNOSIS — C44722 Squamous cell carcinoma of skin of right lower limb, including hip: Secondary | ICD-10-CM | POA: Diagnosis not present

## 2022-01-09 DIAGNOSIS — L570 Actinic keratosis: Secondary | ICD-10-CM | POA: Diagnosis not present

## 2022-01-09 DIAGNOSIS — L821 Other seborrheic keratosis: Secondary | ICD-10-CM | POA: Diagnosis not present

## 2022-01-09 DIAGNOSIS — Z85828 Personal history of other malignant neoplasm of skin: Secondary | ICD-10-CM | POA: Diagnosis not present

## 2022-01-21 DIAGNOSIS — H5212 Myopia, left eye: Secondary | ICD-10-CM | POA: Diagnosis not present

## 2022-01-21 DIAGNOSIS — H04212 Epiphora due to excess lacrimation, left lacrimal gland: Secondary | ICD-10-CM | POA: Diagnosis not present

## 2022-01-21 DIAGNOSIS — H04202 Unspecified epiphora, left lacrimal gland: Secondary | ICD-10-CM | POA: Diagnosis not present

## 2022-01-21 DIAGNOSIS — H52223 Regular astigmatism, bilateral: Secondary | ICD-10-CM | POA: Diagnosis not present

## 2022-02-12 DIAGNOSIS — H5212 Myopia, left eye: Secondary | ICD-10-CM | POA: Diagnosis not present

## 2022-02-12 DIAGNOSIS — H52223 Regular astigmatism, bilateral: Secondary | ICD-10-CM | POA: Diagnosis not present

## 2022-02-12 DIAGNOSIS — Z9849 Cataract extraction status, unspecified eye: Secondary | ICD-10-CM | POA: Diagnosis not present

## 2022-02-12 DIAGNOSIS — H3540 Unspecified peripheral retinal degeneration: Secondary | ICD-10-CM | POA: Diagnosis not present

## 2022-02-12 DIAGNOSIS — H524 Presbyopia: Secondary | ICD-10-CM | POA: Diagnosis not present

## 2022-02-28 ENCOUNTER — Ambulatory Visit (INDEPENDENT_AMBULATORY_CARE_PROVIDER_SITE_OTHER): Payer: Medicare Other | Admitting: Podiatry

## 2022-02-28 ENCOUNTER — Encounter: Payer: Self-pay | Admitting: Podiatry

## 2022-02-28 DIAGNOSIS — M7752 Other enthesopathy of left foot: Secondary | ICD-10-CM | POA: Diagnosis not present

## 2022-02-28 MED ORDER — TRIAMCINOLONE ACETONIDE 10 MG/ML IJ SUSP
10.0000 mg | Freq: Once | INTRAMUSCULAR | Status: AC
  Administered 2022-02-28: 10 mg

## 2022-02-28 NOTE — Progress Notes (Signed)
Subjective:  ? ?Patient ID: [name], female   DOB: 88 y.o.   MRN: [id number]  ? ?HPI ?Patient states she has developed inflammation and fluid around the fourth digit left foot again and states its been very tender ? ? ?ROS ? ? ?   ?Objective:  ?Physical Exam  ?Neurovascular status intact inflammation of the lateral side digit for left with keratotic lesion also present ? ?   ?Assessment:  ?Inflammatory capsulitis fourth digit left lateral side with keratotic lesion formation ? ?   ?Plan:  ?Sterile prep injected the inner phalangeal joint with 2 mg dexamethasone Kenalog 5 mg Xylocaine debrided lesion and see back as needed.  May eventually require arthroplasty surgery ?   ? ? ?

## 2022-04-09 DIAGNOSIS — L821 Other seborrheic keratosis: Secondary | ICD-10-CM | POA: Diagnosis not present

## 2022-04-09 DIAGNOSIS — Z85828 Personal history of other malignant neoplasm of skin: Secondary | ICD-10-CM | POA: Diagnosis not present

## 2022-04-23 ENCOUNTER — Telehealth: Payer: Self-pay | Admitting: Cardiovascular Disease

## 2022-04-23 DIAGNOSIS — R002 Palpitations: Secondary | ICD-10-CM

## 2022-04-23 NOTE — Telephone Encounter (Signed)
Called and scheduled pt for NV on 04/24/22 '@2pm'$ . Order placed at this time for EKG and 14 day zio (clinic enrollment).

## 2022-04-23 NOTE — Telephone Encounter (Signed)
Yes - agree with RN visit, EKG, and ZIO monitor. thanks

## 2022-04-23 NOTE — Telephone Encounter (Signed)
  Patient c/o Palpitations:  High priority if patient c/o lightheadedness, shortness of breath, or chest pain  How long have you had palpitations/irregular HR/ Afib? Are you having the symptoms now? Sunday night, 2 am 2-3 hours  Are you currently experiencing lightheadedness, SOB or CP? No   Do you have a history of afib (atrial fibrillation) or irregular heart rhythm? Yes   Have you checked your BP or HR? (document readings if available): no   Are you experiencing any other symptoms? Pt said, Sunday night, she woke up to go to the bathroom, when she returned to her bed her watch notified her that she was in [place]. She said it lasted for 2-3 hours. She not able to check her BP or H because she stayed in bed. Did not have any symptoms

## 2022-04-23 NOTE — Telephone Encounter (Signed)
Called and spoke with patient. She condones that this past Sunday in early morning hours she got up from bed to use bathroom and upon return, 'felt a beating in her chest." She denies that this was a hard pounding, but states "it's almost as if you get excited, but not that intense." Pt states that she has had these episodes/sensations 'on and off for years," but they don't last long, so she hasn't thought it needed to be addressed. Episode prior to this one was "a few weeks ago." Most recent episode lasted 2-3 hours. She states she felt "a little winded, but not bad really at all." She said her apple watch alterted her that she was in A-fib, but she does not recall the rate. I tried to walk her through opening the app on her phone to view the ecg recording, but we were unsuccessful. She states the palpitations/feeling lasted 2-3 hours, she eventually fell back asleep, and by morning when she woke up, she felt fine. Pt has no past diagnosis of A-fib and was on daily aspirin "for a little while," but no longer takes. Will route to MD for review/recommendation.

## 2022-04-24 ENCOUNTER — Ambulatory Visit (INDEPENDENT_AMBULATORY_CARE_PROVIDER_SITE_OTHER): Payer: Medicare Other | Admitting: *Deleted

## 2022-04-24 ENCOUNTER — Ambulatory Visit (INDEPENDENT_AMBULATORY_CARE_PROVIDER_SITE_OTHER): Payer: Medicare Other

## 2022-04-24 VITALS — HR 77 | Ht 59.0 in | Wt 101.4 lb

## 2022-04-24 DIAGNOSIS — R002 Palpitations: Secondary | ICD-10-CM

## 2022-04-24 NOTE — Progress Notes (Signed)
   Nurse Visit   Date of Encounter: 04/24/2022 ID: [name], DOB May 19, 1933, MRN [id number]  PCP:  Crist Infante, MD   [facility] Providers Cardiologist:  Sherren Mocha, MD      Visit Details   VS:  Pulse 77   Ht '4\' 11"'$  (1.499 m)   Wt 101 lb 6.4 oz (46 kg)   BMI 20.48 kg/m  , BMI Body mass index is 20.48 kg/m.  Wt Readings from Last 3 Encounters:  04/24/22 101 lb 6.4 oz (46 kg)  10/09/21 104 lb 12.8 oz (47.5 kg)  03/28/21 106 lb (48.1 kg)     Reason for visit: possible AFib caught on apple watch Performed today: Vitals, EKG, Provider consulted:Dr .Tamala Julian, and Education Changes (medications, testing, etc.) : n/a Length of Visit: 20 minutes  EKG performed today showing NSR, HR 77.  Pt Health app on her Iphone shows possible AFib 06/2021,  11/2021 and 04/2022.  Episodes short lived and occurs around 2-3 am.  Pt did not have the ECG app on her phone so no strips to confirm if AFib.  Pt educated and app downloaded onto her phone & watch, she now can perform ecg going forward.  2 week zio placed on pt while here today for visit.  No orders received.   Medications Adjustments/Labs and Tests Ordered: No orders of the defined types were placed in this encounter.  No orders of the defined types were placed in this encounter.    Signed, Stanton Kidney, RN  04/24/2022 2:42 PM

## 2022-04-24 NOTE — Progress Notes (Unsigned)
Applied a 14 day Zio XT monitor to patient in the office ?

## 2022-04-24 NOTE — Telephone Encounter (Signed)
Enrolled and applied a 14 day Zio XT to patient in office

## 2022-05-02 ENCOUNTER — Encounter: Payer: Self-pay | Admitting: Podiatry

## 2022-05-02 ENCOUNTER — Ambulatory Visit (INDEPENDENT_AMBULATORY_CARE_PROVIDER_SITE_OTHER): Payer: Medicare Other | Admitting: Podiatry

## 2022-05-02 ENCOUNTER — Ambulatory Visit (INDEPENDENT_AMBULATORY_CARE_PROVIDER_SITE_OTHER): Payer: Medicare Other

## 2022-05-02 ENCOUNTER — Telehealth: Payer: Self-pay | Admitting: Urology

## 2022-05-02 DIAGNOSIS — M7752 Other enthesopathy of left foot: Secondary | ICD-10-CM

## 2022-05-02 DIAGNOSIS — M2042 Other hammer toe(s) (acquired), left foot: Secondary | ICD-10-CM

## 2022-05-02 MED ORDER — DOXYCYCLINE HYCLATE 100 MG PO TABS: 100.0000 mg | ORAL_TABLET | Freq: Two times a day (BID) | ORAL | 1 refills | Status: DC

## 2022-05-13 DIAGNOSIS — R002 Palpitations: Secondary | ICD-10-CM | POA: Diagnosis not present

## 2022-05-20 ENCOUNTER — Telehealth: Payer: Self-pay | Admitting: Cardiovascular Disease

## 2022-05-20 ENCOUNTER — Ambulatory Visit (INDEPENDENT_AMBULATORY_CARE_PROVIDER_SITE_OTHER): Payer: Medicare Other | Admitting: Nurse Practitioner

## 2022-05-20 ENCOUNTER — Telehealth: Payer: Self-pay | Admitting: *Deleted

## 2022-05-20 ENCOUNTER — Encounter: Payer: Self-pay | Admitting: Nurse Practitioner

## 2022-05-20 DIAGNOSIS — H02824 Cysts of left upper eyelid: Secondary | ICD-10-CM | POA: Diagnosis not present

## 2022-05-20 DIAGNOSIS — Z01810 Encounter for preprocedural cardiovascular examination: Secondary | ICD-10-CM | POA: Diagnosis not present

## 2022-05-20 NOTE — Telephone Encounter (Signed)
I s/w the pt and informed her that we just got notice today that she is having surgery tomorrow and we have been asked to provide cardiac clearance. Pt tells me that the surgeon office called her and said they have to mover he surgery as they had not heard from cardiology. I did inform the pt that we just learned of her surgery today. I did inform the pt that we need to have a tele visit for pre op clearance today at 4:20 if is available. Pt agreeable to plan of care. Med rec and consent are done.

## 2022-05-20 NOTE — Telephone Encounter (Signed)
I s/w the pt and informed her that we just got notice today that she is having surgery tomorrow and we have been asked to provide cardiac clearance. Pt tells me that the surgeon office called her and said they have to mover he surgery as they had not heard from cardiology. I did inform the pt that we just learned of her surgery today. I did inform the pt that we need to have a tele visit for pre op clearance today at 4:20 if is available. Pt agreeable to plan of care. Med rec and consent are done.      Patient Consent for Virtual Visit        [name] has provided verbal consent on 05/20/2022 for a virtual visit (video or telephone).   CONSENT FOR VIRTUAL VISIT FOR:  [name]  By participating in this virtual visit I agree to the following:  I hereby voluntarily request, consent and authorize [facility] and its employed or contracted physicians, physician assistants, nurse practitioners or other licensed health care professionals (the Practitioner), to provide me with telemedicine health care services (the "Services") as deemed necessary by the treating Practitioner. I acknowledge and consent to receive the Services by the Practitioner via telemedicine. I understand that the telemedicine visit will involve communicating with the Practitioner through live audiovisual communication technology and the disclosure of certain medical information by electronic transmission. I acknowledge that I have been given the opportunity to request an in-person assessment or other available alternative prior to the telemedicine visit and am voluntarily participating in the telemedicine visit.  I understand that I have the right to withhold or withdraw my consent to the use of telemedicine in the course of my care at any time, without affecting my right to future care or treatment, and that the Practitioner or I may terminate the telemedicine visit at any time. I understand that I have the right to  inspect all information obtained and/or recorded in the course of the telemedicine visit and may receive copies of available information for a reasonable fee.  I understand that some of the potential risks of receiving the Services via telemedicine include:  Delay or interruption in medical evaluation due to technological equipment failure or disruption; Information transmitted may not be sufficient (e.g. poor resolution of images) to allow for appropriate medical decision making by the Practitioner; and/or  In rare instances, security protocols could fail, causing a breach of personal health information.  Furthermore, I acknowledge that it is my responsibility to provide information about my medical history, conditions and care that is complete and accurate to the best of my ability. I acknowledge that Practitioner's advice, recommendations, and/or decision may be based on factors not within their control, such as incomplete or inaccurate data provided by me or distortions of diagnostic images or specimens that may result from electronic transmissions. I understand that the practice of medicine is not an exact science and that Practitioner makes no warranties or guarantees regarding treatment outcomes. I acknowledge that a copy of this consent can be made available to me via my patient portal ([facility]), or I can request a printed copy by calling the office of [facility].    I understand that my insurance will be billed for this visit.   I have read or had this consent read to me. I understand the contents of this consent, which adequately explains the benefits and risks of the Services being provided via telemedicine.  I have  been provided ample opportunity to ask questions regarding this consent and the Services and have had my questions answered to my satisfaction. I give my informed consent for the services to be provided through the use of telemedicine in my medical care

## 2022-05-20 NOTE — Progress Notes (Signed)
Virtual Visit via Telephone Note   Because of [name]'s co-morbid illnesses, she is at least at moderate risk for complications without adequate follow up.  This format is felt to be most appropriate for this patient at this time.  The patient did not have access to video technology/had technical difficulties with video requiring transitioning to audio format only (telephone).  All issues noted in this document were discussed and addressed.  No physical exam could be performed with this format.  Please refer to the patient's chart for her consent to telehealth for [facility].  Evaluation Performed:  Preoperative cardiovascular risk assessment _____________   Date:  05/20/2022   Patient ID:  [name], DOB 1933/05/24, MRN [id number] Patient Location:  Home Provider location:   Office  Primary Care Provider:  Crist Infante, MD Primary Cardiologist:  Sherren Mocha, MD  Chief Complaint / Patient Profile   88 y.o. y/o female with a h/o hyperlipidemia, non-rheumatic aortic valve disease,  who is pending left foot hammertoe repair of 4th toe and presents today for telephonic preoperative cardiovascular risk assessment.  Past Medical History    Past Medical History:  Diagnosis Date   Arthritis    Cataract    Hx of colonic polyps    Hypercholesterolemia    Lumbar back pain    Neck pain    Osteoporosis    Swelling of ankle 01/12/16   bilateral. Pt is taking lasix x 2 weeks and wearing compression stockings   Past Surgical History:  Procedure Laterality Date   ABDOMINAL HYSTERECTOMY  12/2003   DR. Neal--bso, A/P repair   cataract surgery     COLONOSCOPY     HIP ARTHROPLASTY Left 01/28/2021   Procedure: ARTHROPLASTY BIPOLAR HIP (HEMIARTHROPLASTY);  Surgeon: [name], MD;  Location: [facility];  Service: Orthopedics;  Laterality: Left;  depuy, Lateral position with Mark II   LUMBAR LAMINECTOMY  1985   Dr. Gladstone Lighter   POLYPECTOMY     surgery for removal of rectal  polyp  07/1995   Dr. Modena Morrow   TUBAL LIGATION      Allergies  Allergies  Allergen Reactions   Risedronate Sodium Other (See Comments)    Reaction to Actonel   Cephalexin Other (See Comments)    Upset stomach and made very tired   Oxycodone Other (See Comments)   Hydrocodone Nausea Only    History of Present Illness    [name] is a 88 y.o. female who presents via audio/video conferencing for a telehealth visit today.  Pt was last seen in cardiology clinic on 10/09/21 by Dr. Burt Knack.  At that time MATHEW STORCK was doing well.  The patient is now pending procedure as outlined above. Since her last visit, she denies chest pain, shortness of breath, lower extremity edema, fatigue, melena, hematuria, hemoptysis, diaphoresis, weakness, presyncope, syncope, orthopnea, and PND. Reports palpitations previously reported on 04/23/22 have resolved. Her cardiac monitor did not reveal atrial fibrillation and no significant arrhythmia.    Home Medications    Prior to Admission medications   Medication Sig Start Date End Date Taking? Authorizing Provider  aspirin (BAYER ASPIRIN) 325 MG tablet Take 1 tablet (325 mg total) by mouth daily. Take one daily for one month to decrease blood clot risks. Stop after one month Patient not taking: Reported on 10/09/2021 01/29/21   [name], MD  BD PEN NEEDLE NANO 2ND GEN 32G X 4 MM MISC SMARTSIG:Pre-Filled Pen Syringe SUB-Q Daily 05/23/21  [provider]  Calcium Carbonate-Vitamin D (CALCIUM-D PO) Take 1 tablet by mouth 2 (two) times daily.    [provider]  Cholecalciferol (VITAMIN D3 PO) Take 1 tablet by mouth daily.    [provider]  denosumab (PROLIA) 60 MG/ML SOSY injection Inject '60mg'$  every 6 months (start January 2016) two shots as of 12/16 still on it regularly in 2020, last was 09/22/19 [facility] Patient not taking: Reported on 10/09/2021 11/23/14   [provider]  docusate sodium (COLACE) 100 MG  capsule Take 1 capsule (100 mg total) by mouth 2 (two) times daily. 01/30/21   Mariel Aloe, MD  doxycycline (VIBRA-TABS) 100 MG tablet Take 1 tablet (100 mg total) by mouth 2 (two) times daily. 05/02/22   Wallene Huh, DPM  ezetimibe (ZETIA) 10 MG tablet Take 1 tablet (10 mg total) by mouth daily. 12/25/21   Sherren Mocha, MD  FORTEO 600 MCG/2.4ML SOPN Inject into the skin. 08/02/21   [provider]  Glucosamine-Chondroitin (MOVE FREE PO) Take 1 tablet by mouth daily.    [provider]  HYDROcodone-acetaminophen (NORCO) 5-325 MG tablet Take 1 tablet by mouth every 6 (six) hours as needed for moderate pain. Patient not taking: Reported on 10/09/2021 01/29/21   [name], MD  Multiple Vitamin (MULTIVITAMIN WITH MINERALS) TABS tablet Take 1 tablet by mouth daily.    [provider]  rosuvastatin (CRESTOR) 5 MG tablet Take 5 mg by mouth daily. 08/06/21   [provider]  TURMERIC PO Take 1 capsule by mouth 2 (two) times daily.    [provider]    Physical Exam    Vital Signs:  TURQUOISE ESCH does not have vital signs available for review today.  Given telephonic nature of communication, physical exam is limited. AAOx3. NAD. Normal affect.  Speech and respirations are unlabored.  Accessory Clinical Findings    None  Assessment & Plan    1.  Preoperative Cardiovascular Risk Assessment: Patient is doing well from a cardiac perspective and may proceed to surgery without further testing. According to the Revised Cardiac Risk Index (RCRI), her Perioperative Risk of Major Cardiac Event is (%): 0.4.  Her Functional Capacity in METs is: 5.62 according to the [facility] Activity Status Index (DASI).   A copy of this note will be routed to requesting surgeon.  Time:   Today, I have spent 10 minutes with the patient with telehealth technology discussing medical history, symptoms, and management plan.     Emmaline Life, NP-C     05/20/2022, 4:23 PM [place] [address] Office [phone] Fax [phone]

## 2022-05-20 NOTE — Telephone Encounter (Signed)
   Pre-operative Risk Assessment    Patient Name: [name]  DOB: [birth year] MRN: [id number]      Request for Surgical Clearance    Procedure:   Hammertoe Repair; 4th toe, left foot  Date of Surgery:  Clearance 05/21/22                                 Surgeon:  Dr. Lysle Dingwall Group or Practice Name:  [facility] Phone number:  [phone] Fax number:  [phone]   Type of Clearance Requested:   - Medical    Type of Anesthesia:   Anesthesiologist choice   Additional requests/questions:  Please fax a copy of medical clearance to the surgeon's office.  [name]   05/20/2022, 2:02 PM

## 2022-05-27 ENCOUNTER — Encounter: Payer: Medicare Other | Admitting: Podiatry

## 2022-05-27 MED ORDER — HYDROCODONE-ACETAMINOPHEN 10-325 MG PO TABS: 1.0000 | ORAL_TABLET | Freq: Three times a day (TID) | ORAL | 0 refills | Status: AC | PRN

## 2022-05-27 NOTE — Addendum Note (Signed)
Addended by: Wallene Huh on: 05/27/2022 09:16 AM   Modules accepted: Orders

## 2022-05-28 ENCOUNTER — Encounter: Payer: Self-pay | Admitting: Podiatry

## 2022-05-28 DIAGNOSIS — M2042 Other hammer toe(s) (acquired), left foot: Secondary | ICD-10-CM

## 2022-06-03 ENCOUNTER — Ambulatory Visit (INDEPENDENT_AMBULATORY_CARE_PROVIDER_SITE_OTHER): Payer: Medicare Other | Admitting: Podiatry

## 2022-06-03 ENCOUNTER — Ambulatory Visit (INDEPENDENT_AMBULATORY_CARE_PROVIDER_SITE_OTHER): Payer: Medicare Other

## 2022-06-03 ENCOUNTER — Encounter: Payer: Self-pay | Admitting: Podiatry

## 2022-06-03 DIAGNOSIS — M2042 Other hammer toe(s) (acquired), left foot: Secondary | ICD-10-CM

## 2022-06-03 DIAGNOSIS — M21619 Bunion of unspecified foot: Secondary | ICD-10-CM

## 2022-06-03 NOTE — Progress Notes (Signed)
Subjective:   Patient ID: [name], female   DOB: 88 y.o.   MRN: [id number]   HPI Patient states doing fine with surgery and minimal discomfort   ROS      Objective:  Physical Exam  Neurovascular status intact negative Bevelyn Buckles' sign noted digit for left healing well wound edges well coapted good alignment     Assessment:  Doing well post arthroplasty digit for left     Plan:  Reviewed x-rays sterile dressing reapplied and patient will continue open toed shoes and reappoint for [place] to recheck  X-rays indicate that there is good removal had a proximal phalanx digit for left with good alignment noted

## 2022-06-17 ENCOUNTER — Ambulatory Visit (INDEPENDENT_AMBULATORY_CARE_PROVIDER_SITE_OTHER): Payer: Medicare Other

## 2022-06-17 ENCOUNTER — Encounter: Payer: Self-pay | Admitting: Podiatry

## 2022-06-17 ENCOUNTER — Ambulatory Visit (INDEPENDENT_AMBULATORY_CARE_PROVIDER_SITE_OTHER): Payer: Medicare Other | Admitting: Podiatry

## 2022-06-17 DIAGNOSIS — M2042 Other hammer toe(s) (acquired), left foot: Secondary | ICD-10-CM | POA: Diagnosis not present

## 2022-06-17 DIAGNOSIS — Z9889 Other specified postprocedural states: Secondary | ICD-10-CM

## 2022-06-17 NOTE — Progress Notes (Signed)
Subjective:   Patient ID: [name], female   DOB: 88 y.o.   MRN: [id number]   HPI Patient states doing well with swelling in the toe if I do too much but no drainage noted neuro   ROS      Objective:  Physical Exam  Vascular status intact negative Bevelyn Buckles' sign noted digit for left healed well wound edges well coapted stitches intact     Assessment:  Appears to be doing well post arthroplasty fourth digit left mild swelling which is somewhat normal with this type of procedure     Plan:  H&P x-ray reviewed stitches removed explained that swelling can persist and to be careful with shoe gear but it should gradually improve.  Patient will be seen back as needed  X-rays indicate satisfactory resection of bone head of proximal phalanx digit for left

## 2022-06-21 DIAGNOSIS — R7989 Other specified abnormal findings of blood chemistry: Secondary | ICD-10-CM | POA: Diagnosis not present

## 2022-06-21 DIAGNOSIS — E785 Hyperlipidemia, unspecified: Secondary | ICD-10-CM | POA: Diagnosis not present

## 2022-06-21 DIAGNOSIS — M810 Age-related osteoporosis without current pathological fracture: Secondary | ICD-10-CM | POA: Diagnosis not present

## 2022-06-28 DIAGNOSIS — K4090 Unilateral inguinal hernia, without obstruction or gangrene, not specified as recurrent: Secondary | ICD-10-CM | POA: Diagnosis not present

## 2022-06-28 DIAGNOSIS — M810 Age-related osteoporosis without current pathological fracture: Secondary | ICD-10-CM | POA: Diagnosis not present

## 2022-06-28 DIAGNOSIS — Z1339 Encounter for screening examination for other mental health and behavioral disorders: Secondary | ICD-10-CM | POA: Diagnosis not present

## 2022-06-28 DIAGNOSIS — R82998 Other abnormal findings in urine: Secondary | ICD-10-CM | POA: Diagnosis not present

## 2022-06-28 DIAGNOSIS — E785 Hyperlipidemia, unspecified: Secondary | ICD-10-CM | POA: Diagnosis not present

## 2022-06-28 DIAGNOSIS — Z0000 Encounter for general adult medical examination without abnormal findings: Secondary | ICD-10-CM | POA: Diagnosis not present

## 2022-06-28 DIAGNOSIS — Z1331 Encounter for screening for depression: Secondary | ICD-10-CM | POA: Diagnosis not present

## 2022-07-18 ENCOUNTER — Other Ambulatory Visit (HOSPITAL_COMMUNITY): Payer: Self-pay | Admitting: *Deleted

## 2022-07-19 ENCOUNTER — Ambulatory Visit (HOSPITAL_COMMUNITY)
Admission: RE | Admit: 2022-07-19 | Discharge: 2022-07-19 | Disposition: A | Discharge status: Home / Self Care | Payer: Medicare Other | Source: Ambulatory Visit | Attending: Internal Medicine | Admitting: Internal Medicine

## 2022-07-19 DIAGNOSIS — M810 Age-related osteoporosis without current pathological fracture: Secondary | ICD-10-CM | POA: Diagnosis not present

## 2022-07-19 MED ORDER — DENOSUMAB 60 MG/ML ~~LOC~~ SOSY
PREFILLED_SYRINGE | SUBCUTANEOUS | Status: AC
  Filled 2022-07-19: qty 1

## 2022-07-19 MED ORDER — DENOSUMAB 60 MG/ML ~~LOC~~ SOSY
60.0000 mg | PREFILLED_SYRINGE | Freq: Once | SUBCUTANEOUS | Status: AC
  Administered 2022-07-19: 60 mg via SUBCUTANEOUS

## 2022-08-17 IMAGING — CR DG CHEST 1V PORT
1 series · 1 of 1 positions shown · non-contrast
Comparison: 03/02/2014

CLINICAL DATA: Preop

EXAM:
PORTABLE CHEST 1 VIEW

[x chest ap]
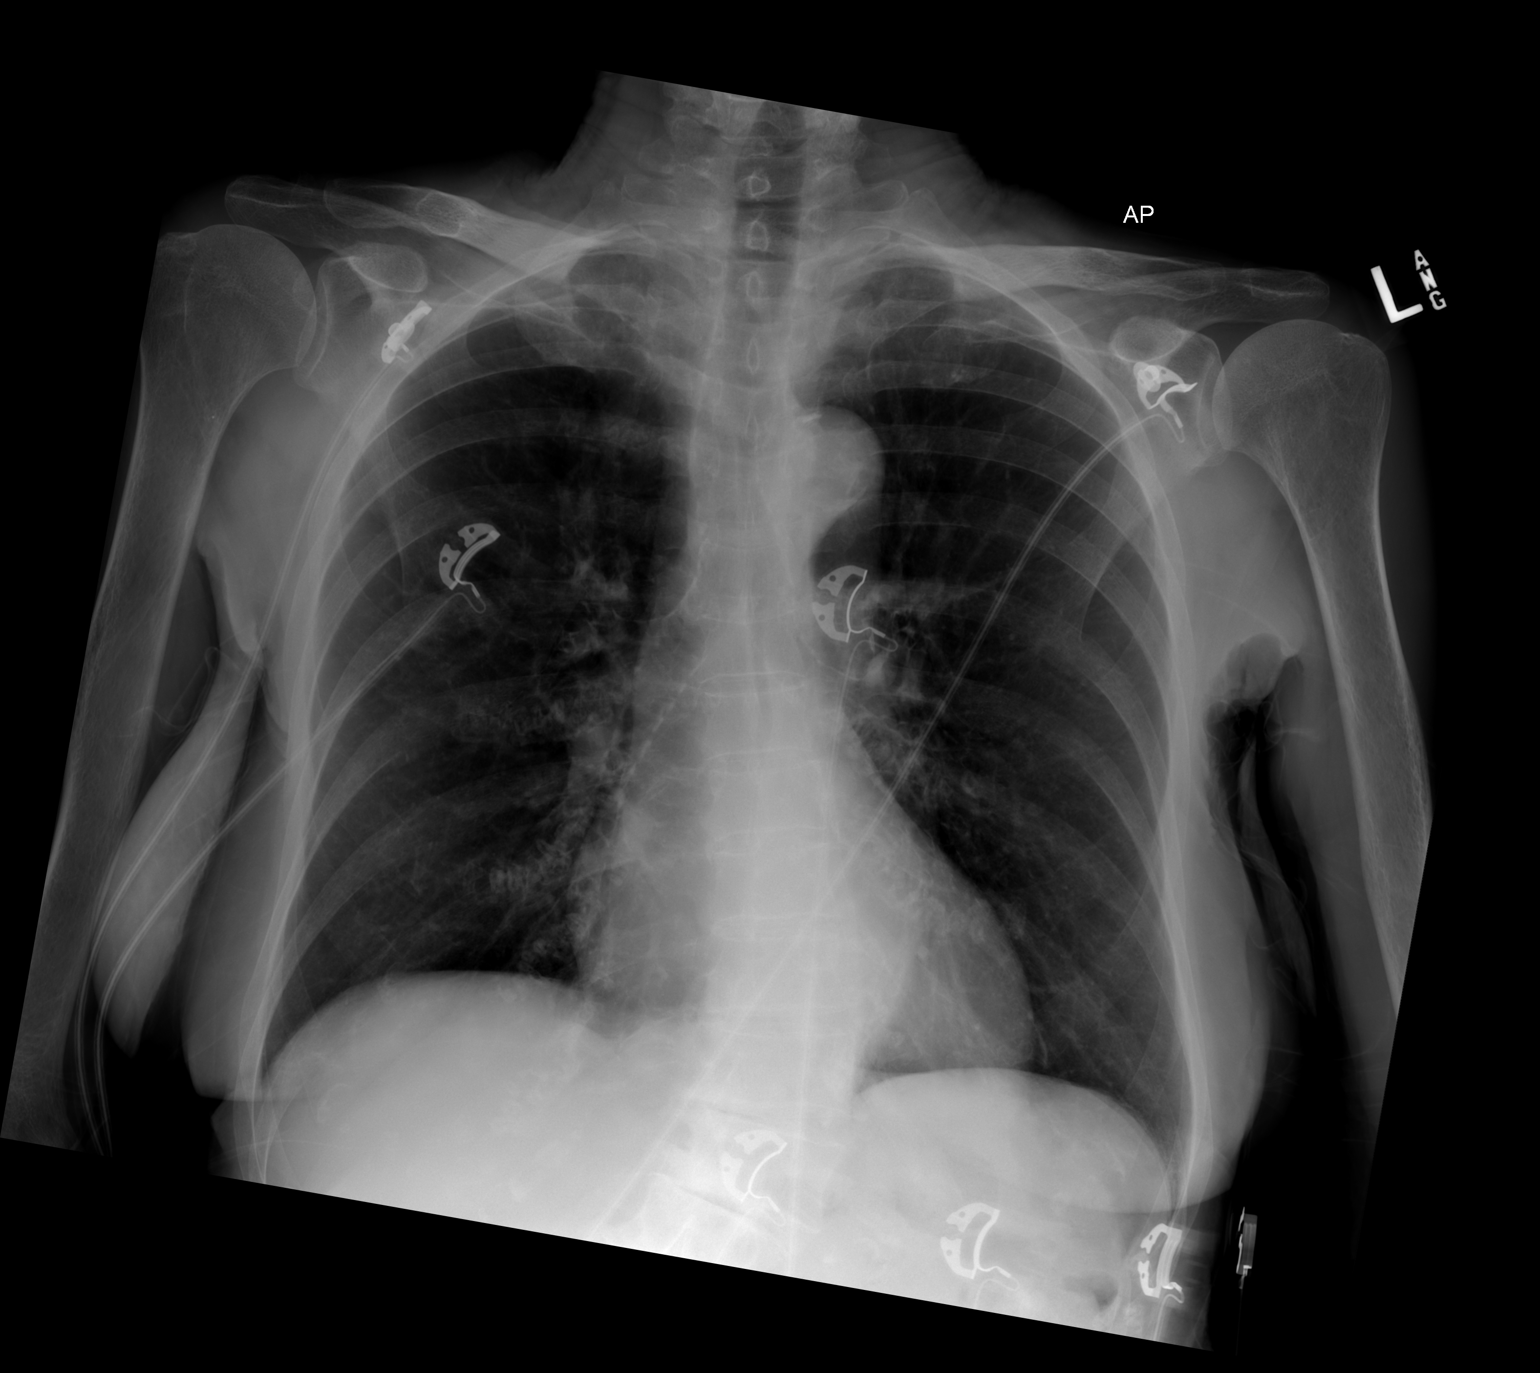

[1 of 1 positions shown; findings below may reference images not displayed]

FINDINGS: Mild peribronchial thickening. Heart and mediastinal contours are
within normal limits. No focal opacities or effusions. No acute bony
abnormality.
IMPRESSION: Mild bronchitic changes.

## 2022-08-17 IMAGING — CT CT HEAD W/O CM
3 series · 16 of 47 positions shown, 19 images · non-contrast
Comparison: No pertinent prior exams available for comparison.

CLINICAL DATA: Head trauma, minor. Additional provided: Fall at
home hitting left head on banister. Laceration to left head.

EXAM:
CT HEAD WITHOUT CONTRAST
TECHNIQUE: Contiguous axial images were obtained from the base of the skull
through the vertex without intravenous contrast.

[Series 2: head wo · axial · 0.42mm/px · z∈[+1460,+1585]mm · 10 of 31 slices shown, 13 images]
[im 3/31  brain]
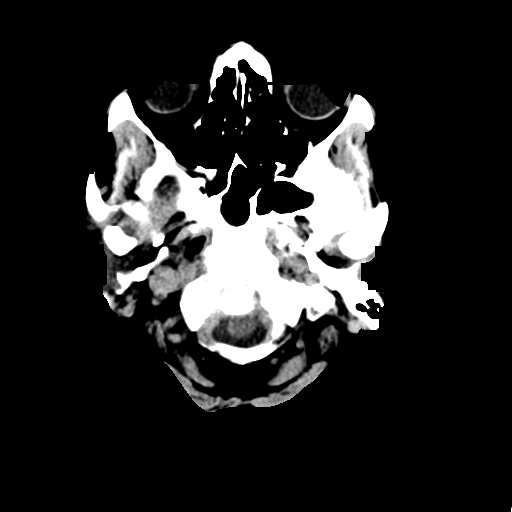
[im 3/31  bone]
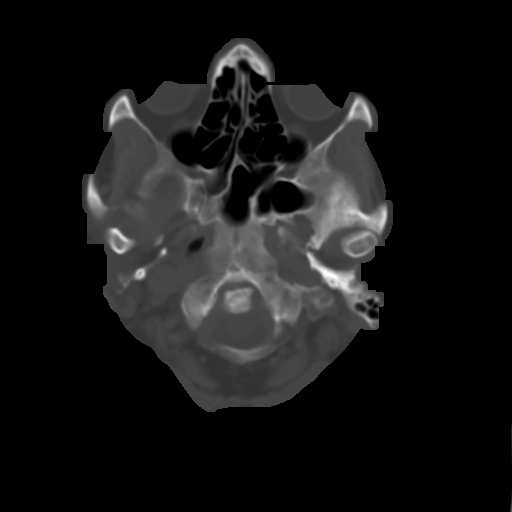
[im 6/31  brain]
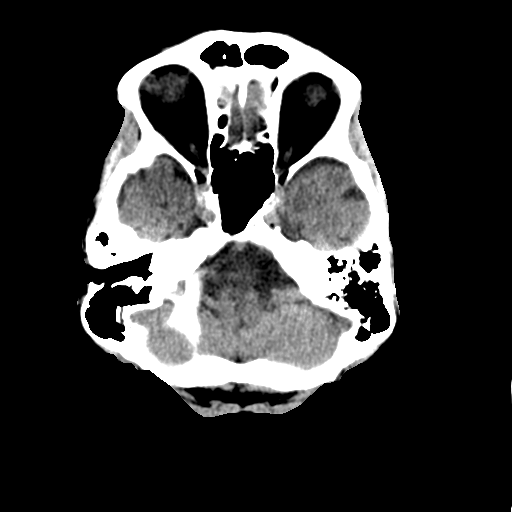
[im 9/31  brain]
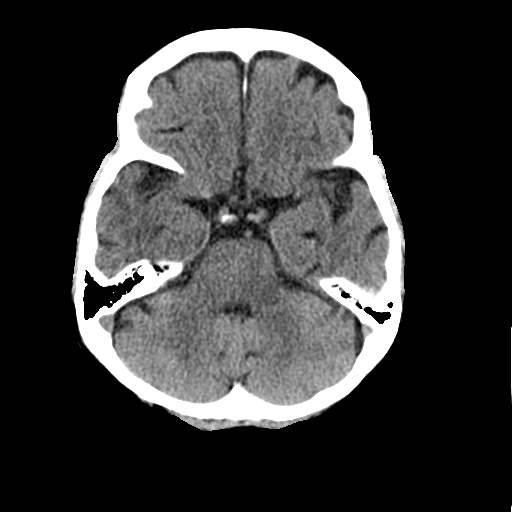
[im 11/31  brain]
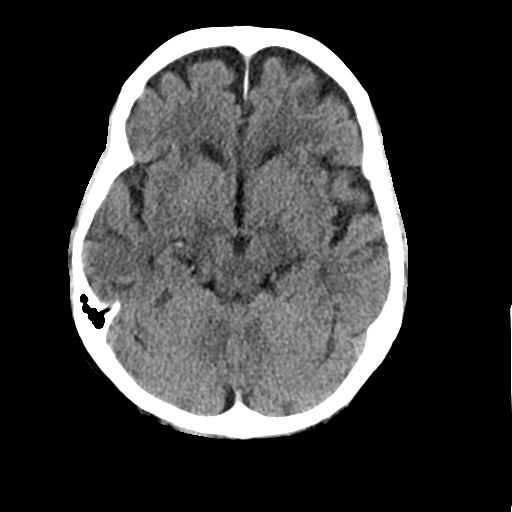
[im 14/31  brain]
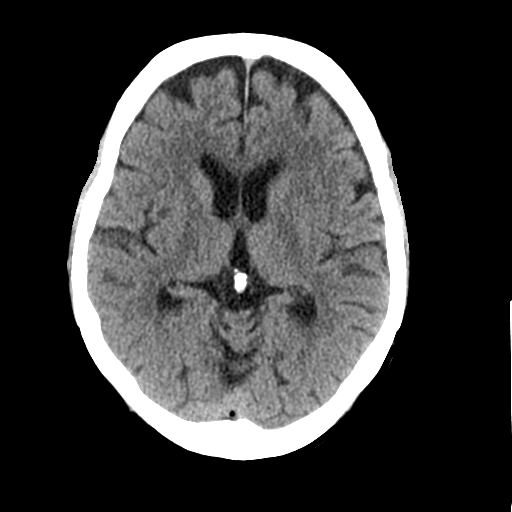
[im 14/31  bone]
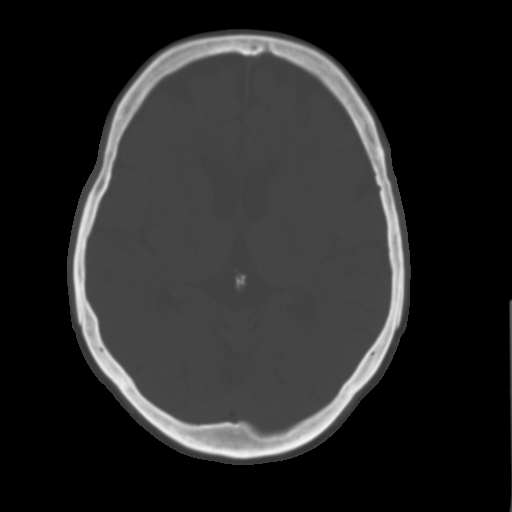
[im 17/31  brain]
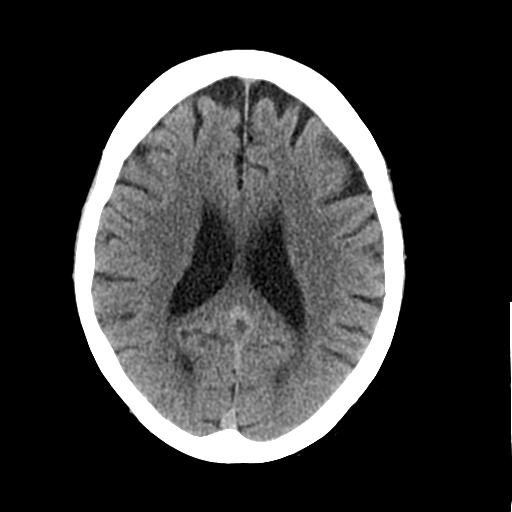
[im 20/31  brain]
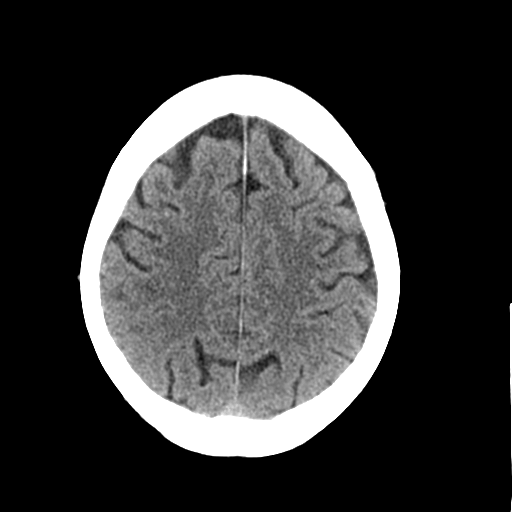
[im 23/31  brain]
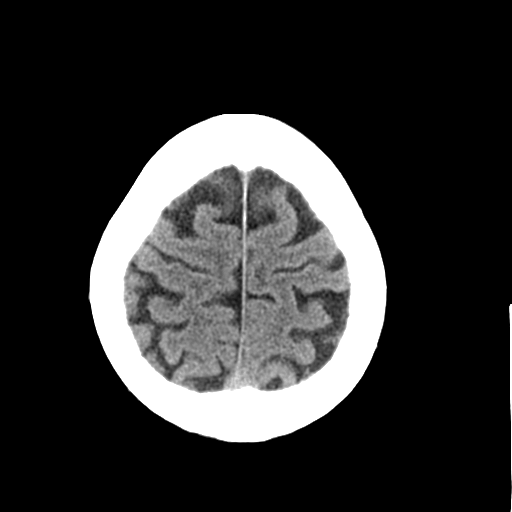
[im 25/31  brain]
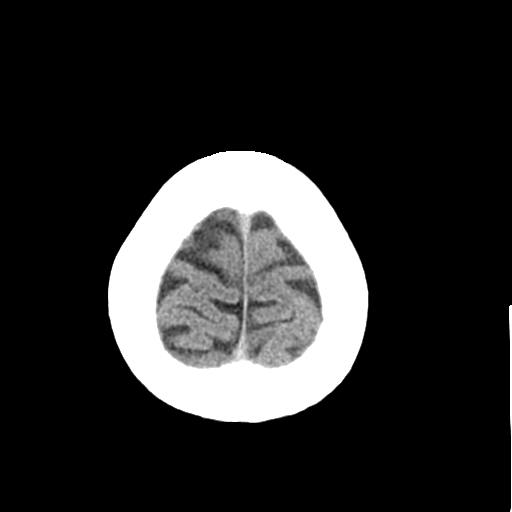
[im 25/31  bone]
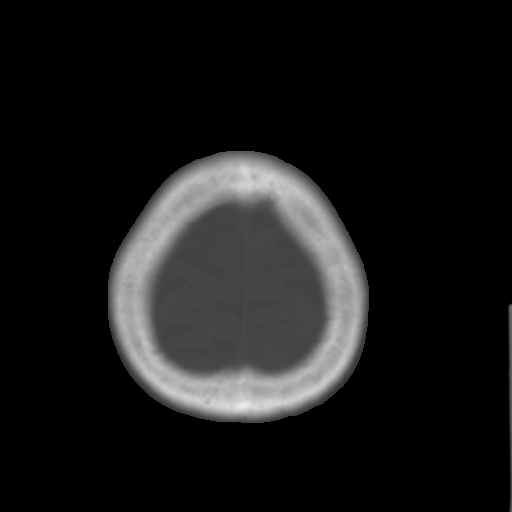
[im 28/31  brain]
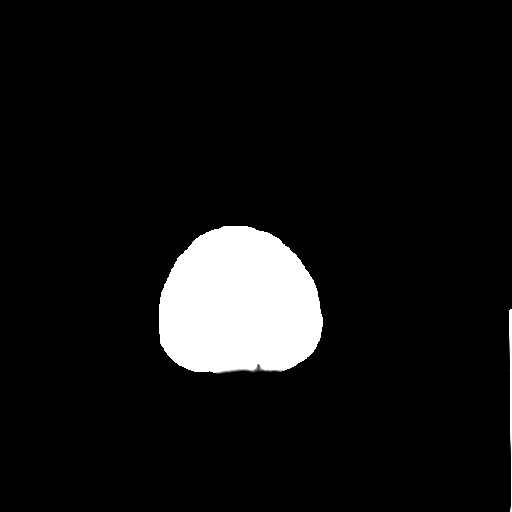

[Series 4: coronal soft tissue · coronal · 0.30mm/px · 3 of 64 slices shown]
[im 22/64  brain]
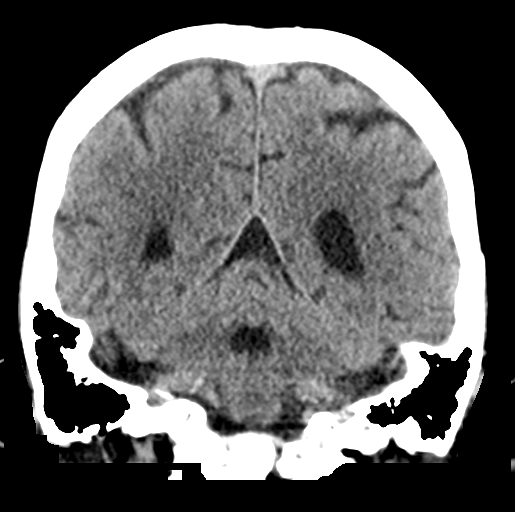
[im 29/64  brain]
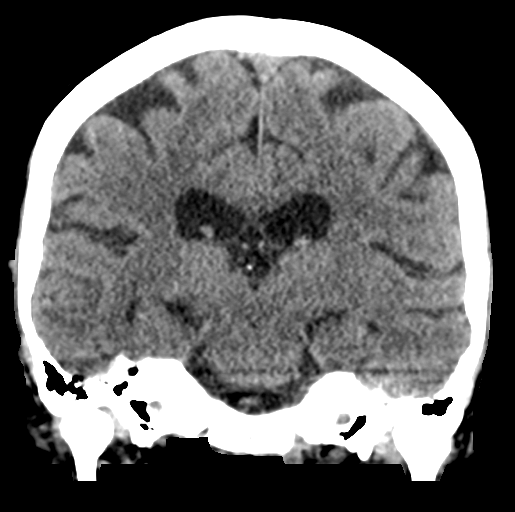
[im 36/64  brain]
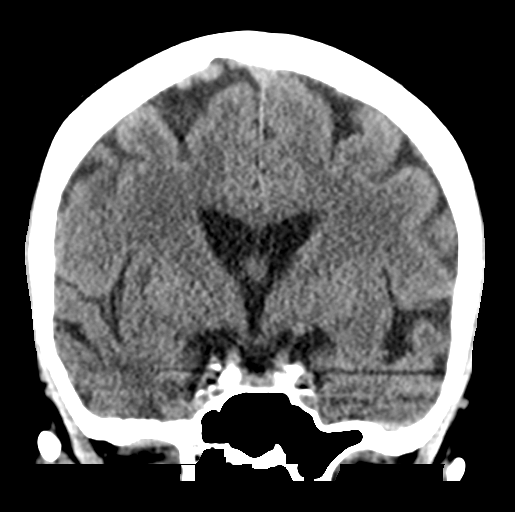

[Series 5: sagittal soft tissue · sagittal · 0.30mm/px · 3 of 55 slices shown]
[im 19/55  brain]
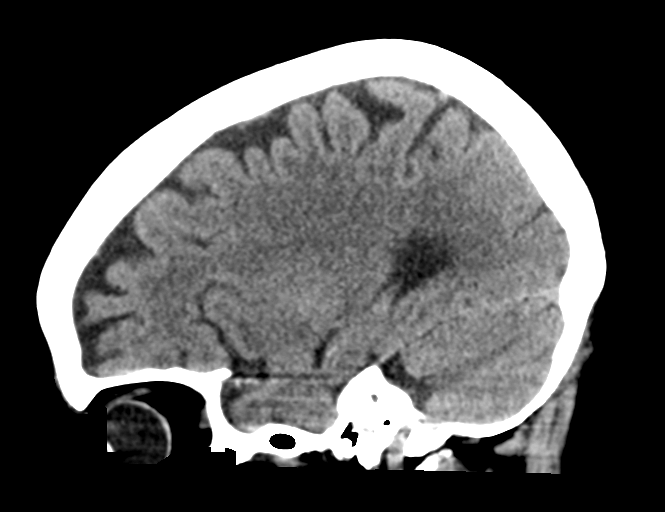
[im 28/55  brain]
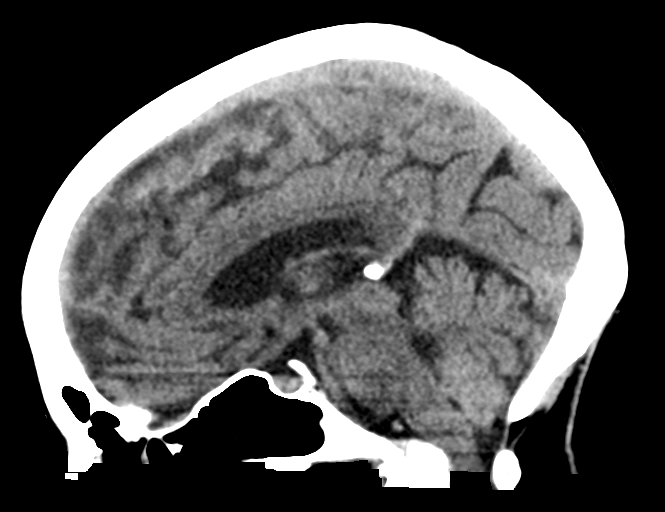
[im 37/55  brain]
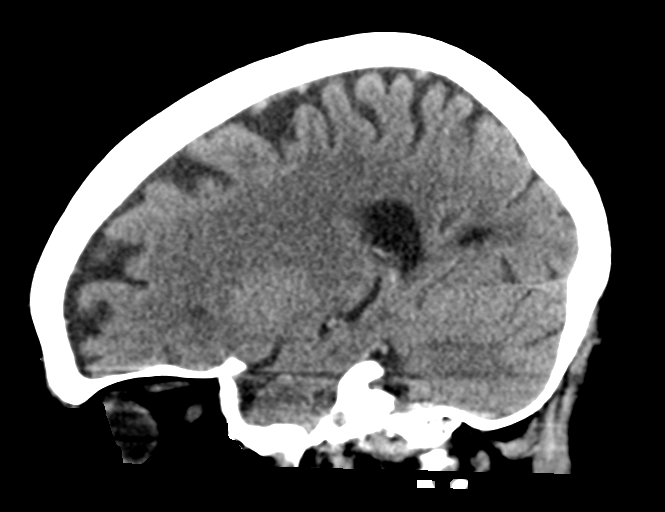

[16 of 47 positions shown; findings below may reference images not displayed]

FINDINGS: Brain:

Mild cerebral atrophy.

There is no acute intracranial hemorrhage.

No demarcated cortical infarct.

No extra-axial fluid collection.

No evidence of intracranial mass.

No midline shift.

Vascular: No hyperdense vessel.  Atherosclerotic calcifications

Skull: Normal. Negative for fracture or focal lesion.

Sinuses/Orbits: Visualized orbits show no acute finding. Small left
sphenoid sinus fluid level.
IMPRESSION: No evidence of acute intracranial abnormality.

Mild cerebral atrophy.

Left sphenoid sinusitis.

## 2022-08-18 IMAGING — DX DG PORTABLE PELVIS
1 series · 1 of 1 positions shown · non-contrast
Comparison: None.

CLINICAL DATA: Postoperative for left hip prosthesis

EXAM:
PORTABLE PELVIS 1-2 VIEWS

[pelvis ap]
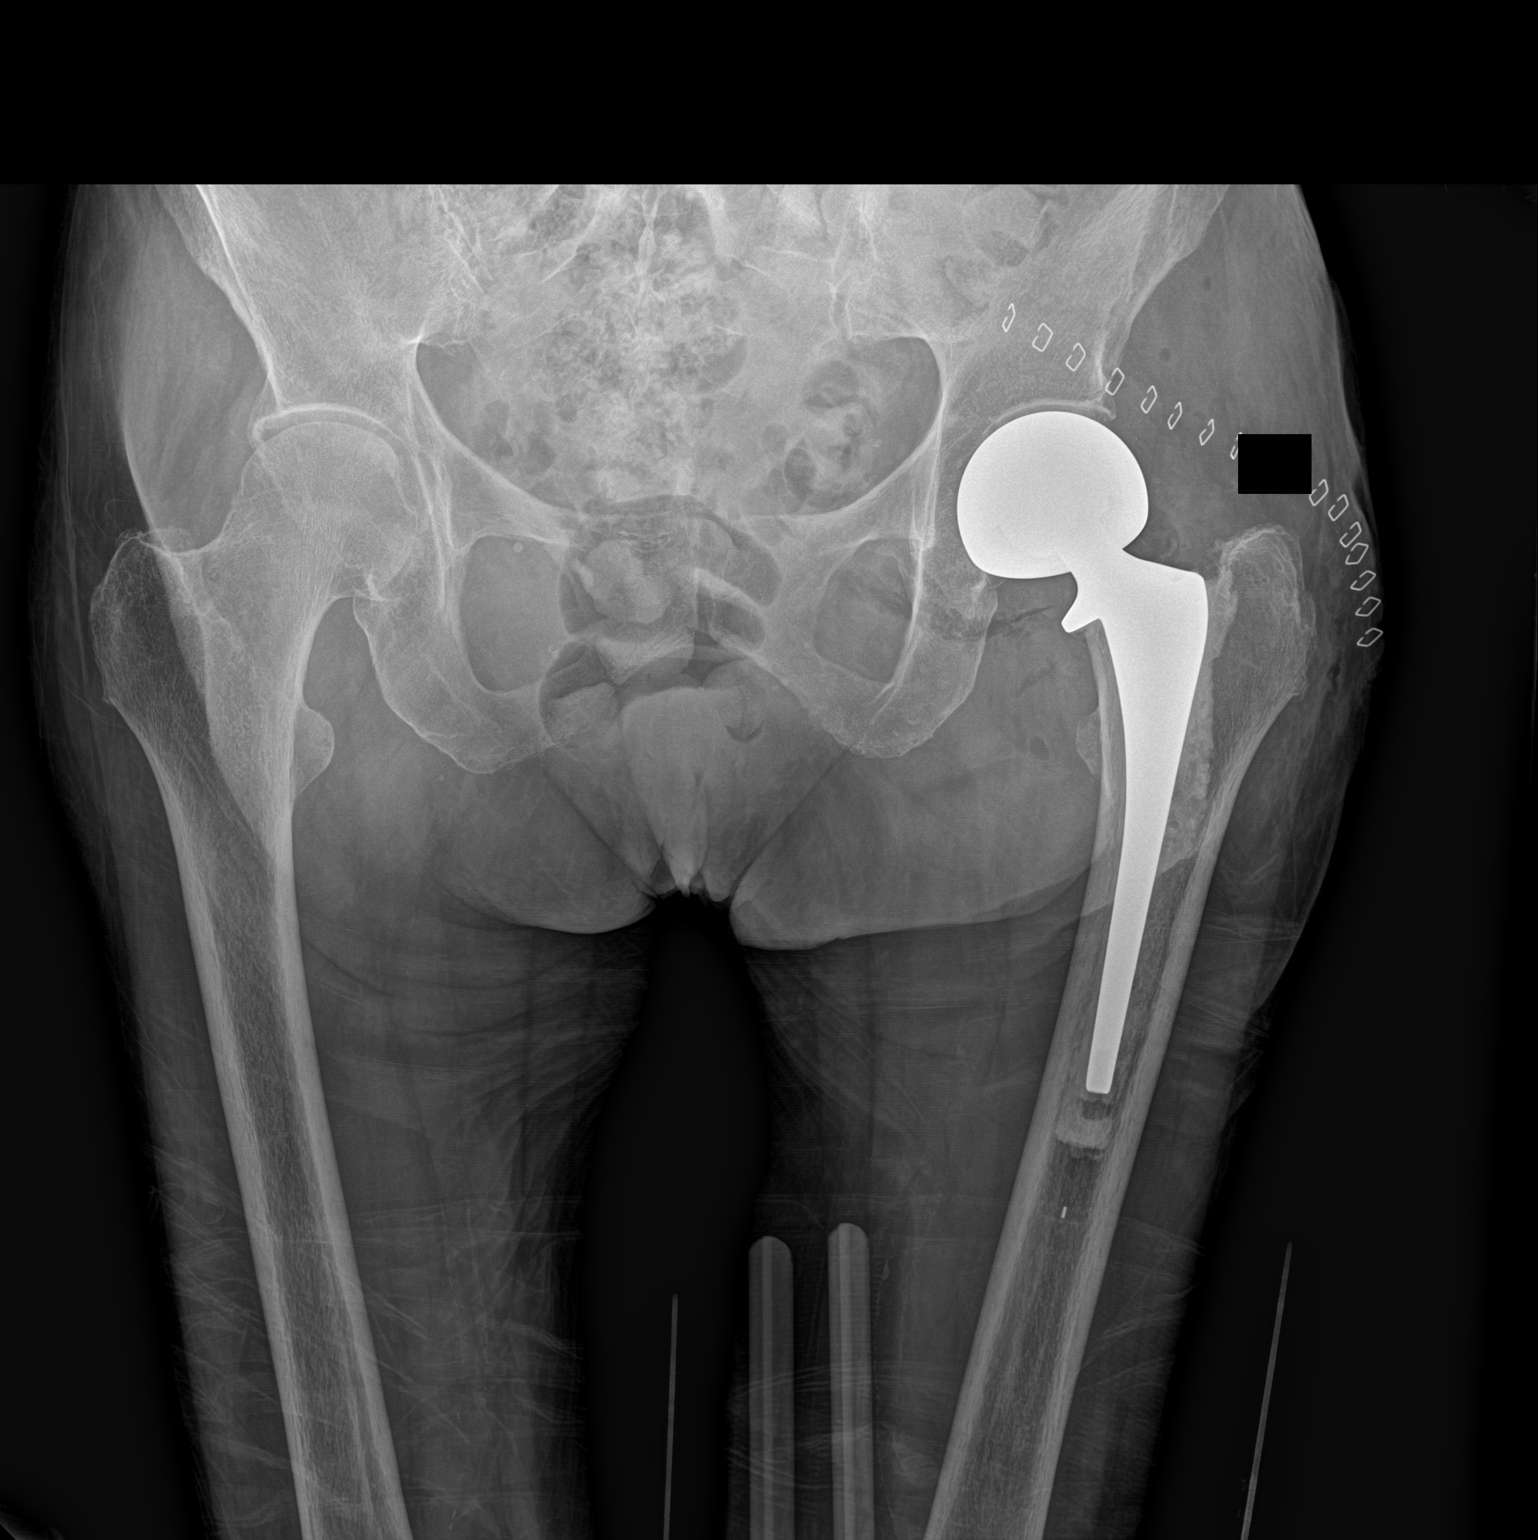

[1 of 1 positions shown; findings below may reference images not displayed]

FINDINGS: Left hip hemiarthroplasty noted without fracture or early
complicating feature. Typical early postoperative features including
gas in the subcutaneous tissues skin staples.

Degenerative disc disease and spondylosis at the lumbosacral
junction. Bony demineralization.
IMPRESSION: 1. Left hip hemiarthroplasty without complicating feature.
2. Bony demineralization.
3. Degenerative disc disease and spondylosis at the lumbosacral
junction.

## 2022-09-04 DIAGNOSIS — Z23 Encounter for immunization: Secondary | ICD-10-CM | POA: Diagnosis not present

## 2022-09-19 DIAGNOSIS — Z23 Encounter for immunization: Secondary | ICD-10-CM | POA: Diagnosis not present

## 2022-09-25 ENCOUNTER — Ambulatory Visit (HOSPITAL_COMMUNITY): Payer: Medicare Other | Attending: Cardiology

## 2022-09-25 DIAGNOSIS — E782 Mixed hyperlipidemia: Secondary | ICD-10-CM | POA: Insufficient documentation

## 2022-09-25 DIAGNOSIS — I350 Nonrheumatic aortic (valve) stenosis: Secondary | ICD-10-CM | POA: Insufficient documentation

## 2022-09-25 LAB — ECHOCARDIOGRAM COMPLETE
AR max vel: 0.94 cm2
AV Area VTI: 0.93 cm2
AV Area mean vel: 0.9 cm2
AV Mean grad: 17 mmHg
AV Peak grad: 30.5 mmHg
Ao pk vel: 2.76 m/s
Area-P 1/2: 3.68 cm2
P 1/2 time: 652 msec
S' Lateral: 2.3 cm

## 2022-10-11 ENCOUNTER — Encounter: Payer: Self-pay | Admitting: Cardiovascular Disease

## 2022-10-11 ENCOUNTER — Ambulatory Visit: Payer: Medicare Other | Attending: Cardiovascular Disease | Admitting: Cardiovascular Disease

## 2022-10-11 VITALS — BP 126/78 | HR 62 | Ht 59.0 in | Wt 101.8 lb

## 2022-10-11 DIAGNOSIS — E782 Mixed hyperlipidemia: Secondary | ICD-10-CM | POA: Diagnosis not present

## 2022-10-11 DIAGNOSIS — I350 Nonrheumatic aortic (valve) stenosis: Secondary | ICD-10-CM | POA: Insufficient documentation

## 2022-10-11 NOTE — Progress Notes (Signed)
Cardiology Office Note:    Date:  10/11/2022   ID:  [name], DOB 03-17-1933, MRN [id number]  PCP:  Crist Infante, [place] Prov[facility] Cardiologist:  Sherren Mocha, MD     Referring MD: Crist Infante, MD   Chief Complaint  Patient presents with   Aortic Stenosis    History of Present Illness:    [name] is a 88 y.o. female with a hx of mild aortic stenosis and mixed hyperlipidemia, presenting for follow-up evaluation.  She has had some problems with chronic left leg swelling over the years felt to be related to lymphedema.  Over the past 2 years she has been noted to have a heart murmur and has been diagnosed with mild to moderate aortic stenosis.  The patient is here alone today. She is staying busy and feels good. She hosted 19 people for Thanksgiving and is going to host 30 over the Christmas Holiday.  The patient is doing great.  She brought in fresh baked goods today for everyone.  She is remarkably active and energetic.  She denies chest pain, chest pressure, or shortness of breath.  She has had some limitation due to a left hip fracture that occurred a few years back.  She otherwise is getting along really well.  She is compliant with her medications and reports no problems related to these today.  Past Medical History:  Diagnosis Date   Arthritis    Cataract    Hx of colonic polyps    Hypercholesterolemia    Lumbar back pain    Neck pain    Osteoporosis    Swelling of ankle 01/12/16   bilateral. Pt is taking lasix x 2 weeks and wearing compression stockings    Past Surgical History:  Procedure Laterality Date   ABDOMINAL HYSTERECTOMY  12/2003   DR. Neal--bso, A/P repair   cataract surgery     COLONOSCOPY     HIP ARTHROPLASTY Left 01/28/2021   Procedure: ARTHROPLASTY BIPOLAR HIP (HEMIARTHROPLASTY);  Surgeon: [name], MD;  Location: [facility];  Service: Orthopedics;  Laterality: Left;  depuy, Lateral position with Mark II   LUMBAR  LAMINECTOMY  1985   Dr. Gladstone Lighter   POLYPECTOMY     surgery for removal of rectal polyp  07/1995   Dr. Modena Morrow   TUBAL LIGATION      Current Medications: Current Meds  Medication Sig   BD PEN NEEDLE NANO 2ND GEN 32G X 4 MM MISC SMARTSIG:Pre-Filled Pen Syringe SUB-Q Daily   Calcium Carbonate-Vitamin D (CALCIUM-D PO) Take 1 tablet by mouth 2 (two) times daily.   Cholecalciferol (VITAMIN D3 PO) Take 1 tablet by mouth daily.   docusate sodium (COLACE) 100 MG capsule Take 1 capsule (100 mg total) by mouth 2 (two) times daily.   ezetimibe (ZETIA) 10 MG tablet Take 1 tablet (10 mg total) by mouth daily.   Glucosamine-Chondroitin (MOVE FREE PO) Take 1 tablet by mouth daily.   Multiple Vitamin (MULTIVITAMIN WITH MINERALS) TABS tablet Take 1 tablet by mouth daily.   rosuvastatin (CRESTOR) 5 MG tablet Take 5 mg by mouth daily.   TURMERIC PO Take 1 capsule by mouth 2 (two) times daily.     Allergies:   Risedronate sodium, Cephalexin, Oxycodone, and Hydrocodone   Social History   Socioeconomic History   Marital status: Widowed    Spouse name: Not on file   Number of children: 7   Years of education: Not on file  Highest education level: Not on file  Occupational History   Occupation: retired  Tobacco Use   Smoking status: Former    Packs/day: 0.25    Years: 1.00    Total pack years: 0.25    Types: Cigarettes    Quit date: 11/11/1954    Years since quitting: 67.9   Smokeless tobacco: Never  Vaping Use   Vaping Use: Never used  Substance and Sexual Activity   Alcohol use: Yes    Alcohol/week: 7.0 standard drinks of alcohol    Types: 7 Standard drinks or equivalent per week    Comment: wine with dinner   Drug use: No   Sexual activity: Never    Birth control/protection: Abstinence  Other Topics Concern   Not on file  Social History Narrative   Not on file   Social Determinants of Health   Financial Resource Strain: Not on file  Food Insecurity: Not on file  Transportation  Needs: Not on file  Physical Activity: Not on file  Stress: Not on file  Social Connections: Not on file     Family History: The patient's family history includes Heart Problems in her brother and sister; Stroke in her mother. There is no history of Stomach cancer, Colon cancer, Pancreatic cancer, Rectal cancer, Colon polyps, or Esophageal cancer.  ROS:   Please see the history of present illness.    All other systems reviewed and are negative.  EKGs/Labs/Other Studies Reviewed:    The following studies were reviewed today: 2D echocardiogram 09/25/2022:  1. Left ventricular ejection fraction, by estimation, is 65 to 70%. The  left ventricle has normal function. The left ventricle has no regional  wall motion abnormalities. There is mild asymmetric left ventricular  hypertrophy of the basal-septal segment.  Left ventricular diastolic parameters are indeterminate. The average left  ventricular global longitudinal strain is -21.2 %.   2. Right ventricular systolic function is normal. The right ventricular  size is normal. There is normal pulmonary artery systolic pressure. The  estimated right ventricular systolic pressure is 93.2 mmHg.   3. The mitral valve is abnormal. Trivial mitral valve regurgitation.  Severe mitral annular calcification.   4. The inferior vena cava is normal in size with greater than 50%  respiratory variability, suggesting right atrial pressure of 3 mmHg.   5. The aortic valve is calcified. There is moderate calcification of the  aortic valve. Aortic valve regurgitation is mild. Moderate aortic valve  stenosis. Vmax 2.8 m/s, MG 43mHg, AVA 0.9 cm^2, DI 0.37   FINDINGS   Left Ventricle: Left ventricular ejection fraction, by estimation, is 65  to 70%. The left ventricle has normal function. The left ventricle has no  regional wall motion abnormalities. The average left ventricular global  longitudinal strain is -21.2 %.  The left ventricular internal cavity  size was normal in size. There is  mild asymmetric left ventricular hypertrophy of the basal-septal segment.  Left ventricular diastolic parameters are indeterminate.   Right Ventricle: The right ventricular size is normal. No increase in  right ventricular wall thickness. Right ventricular systolic function is  normal. There is normal pulmonary artery systolic pressure. The tricuspid  regurgitant velocity is 2.52 m/s, and   with an assumed right atrial pressure of 3 mmHg, the estimated right  ventricular systolic pressure is 267.1mmHg.   Left Atrium: Left atrial size was normal in size.   Right Atrium: Right atrial size was normal in size.   Pericardium: There is no evidence  of pericardial effusion.   Mitral Valve: The mitral valve is abnormal. Severe mitral annular  calcification. Trivial mitral valve regurgitation.   Tricuspid Valve: The tricuspid valve is normal in structure. Tricuspid  valve regurgitation is trivial.   Aortic Valve: The aortic valve is calcified. There is moderate  calcification of the aortic valve. Aortic valve regurgitation is mild.  Aortic regurgitation PHT measures 652 msec. Moderate aortic stenosis is  present. Aortic valve mean gradient measures  17.0 mmHg. Aortic valve peak gradient measures 30.5 mmHg. Aortic valve  area, by VTI measures 0.93 cm.   Pulmonic Valve: The pulmonic valve was not well visualized. Pulmonic valve  regurgitation is not visualized.   Aorta: The aortic root and ascending aorta are structurally normal, with  no evidence of dilitation.   Venous: The inferior vena cava is normal in size with greater than 50%  respiratory variability, suggesting right atrial pressure of 3 mmHg.   IAS/Shunts: The interatrial septum was not well visualized.   EKG:  EKG is ordered today.  The ekg ordered today demonstrates normal sinus rhythm 62 bpm, within normal limits.  Recent Labs: No results found for requested labs within last 365 days.   Recent Lipid Panel    Component Value Date/Time   CHOL 182 09/28/2018 0729   TRIG 53 09/28/2018 0729   HDL 63 09/28/2018 0729   CHOLHDL 2.9 09/28/2018 0729   CHOLHDL 2.3 04/19/2016 0736   VLDL 10 04/19/2016 0736   LDLCALC 108 (H) 09/28/2018 0729   LDLDIRECT 158.8 11/14/2011 0733     Risk Assessment/Calculations:                Physical Exam:    VS:  BP 126/78 (BP Location: Left Arm, Patient Position: Sitting, Cuff Size: Normal)   Pulse 62   Ht '4\' 11"'$  (1.499 m)   Wt 101 lb 12.8 oz (46.2 kg)   SpO2 98%   BMI 20.56 kg/m     Wt Readings from Last 3 Encounters:  10/11/22 101 lb 12.8 oz (46.2 kg)  04/24/22 101 lb 6.4 oz (46 kg)  10/09/21 104 lb 12.8 oz (47.5 kg)     GEN:  Well nourished, well developed in no acute distress HEENT: Normal NECK: No JVD; No carotid bruits LYMPHATICS: No lymphadenopathy CARDIAC: RRR, 2/6 systolic murmur at the right upper sternal border, early peaking RESPIRATORY:  Clear to auscultation without rales, wheezing or rhonchi  ABDOMEN: Soft, non-tender, non-distended MUSCULOSKELETAL:  1+ left leg edema; No deformity  SKIN: Warm and dry NEUROLOGIC:  Alert and oriented x 3 PSYCHIATRIC:  Normal affect   ASSESSMENT:    1. Nonrheumatic aortic valve stenosis   2. Mixed hyperlipidemia    PLAN:    In order of problems listed above:  The patient's echocardiogram is reviewed.  She has normal LV function with LVEF 65 to 70% and moderately calcified aortic valve with a mean transvalvular gradient of 17 mmHg, consistent with mild to moderate aortic stenosis.  I plan on seeing her back in 1 year with a follow-up echocardiogram prior to that visit.  She is asymptomatic from a cardiovascular perspective at this time. Treated with Zetia and rosuvastatin.  Cholesterol is 131, HDL 50, LDL 67.      Medication Adjustments/Labs and Tests Ordered: Current medicines are reviewed at length with the patient today.  Concerns regarding medicines are outlined  above.  Orders Placed This Encounter  Procedures   EKG 12-Lead   ECHOCARDIOGRAM COMPLETE   No orders of  the defined types were placed in this encounter.   Patient Instructions  Medication Instructions:  Your physician recommends that you continue on your current medications as directed. Please refer to the Current Medication list given to you today.  *If you need a refill on your cardiac medications before your next appointment, please call your pharmacy*   Lab Work: NONE If you have labs (blood work) drawn today and your tests are completely normal, you will receive your results only by: [place] (if you have MyChart) OR A paper copy in the mail If you have any lab test that is abnormal or we need to change your treatment, we will call you to review the results.   Testing/Procedures: ECHO (to be done in 1 year, prior to visit) Your physician has requested that you have an echocardiogram. Echocardiography is a painless test that uses sound waves to create images of your heart. It provides your doctor with information about the size and shape of your heart and how well your heart's chambers and valves are working. This procedure takes approximately one hour. There are no restrictions for this procedure. Please do NOT wear cologne, perfume, aftershave, or lotions (deodorant is allowed). Please arrive 15 minutes prior to your appointment time.  Follow-Up: At [facility], you and your health needs are our priority.  As part of our continuing mission to provide you with exceptional heart care, we have created designated Provider Care Teams.  These Care Teams include your primary Cardiologist (physician) and Advanced Practice Providers (APPs -  Physician Assistants and Nurse Practitioners) who all work together to provide you with the care you need, when you need it.  Your next appointment:   1 year(s)  The format for your next appointment:   In Person  Provider:    Sherren Mocha, MD       Important Information About Sugar         Signed, Sherren Mocha, MD  10/11/2022 1:34 PM    [facility]

## 2022-10-11 NOTE — Patient Instructions (Signed)
Medication Instructions:  Your physician recommends that you continue on your current medications as directed. Please refer to the Current Medication list given to you today.  *If you need a refill on your cardiac medications before your next appointment, please call your pharmacy*   Lab Work: NONE If you have labs (blood work) drawn today and your tests are completely normal, you will receive your results only by: [place] (if you have MyChart) OR A paper copy in the mail If you have any lab test that is abnormal or we need to change your treatment, we will call you to review the results.   Testing/Procedures: ECHO (to be done in 1 year, prior to visit) Your physician has requested that you have an echocardiogram. Echocardiography is a painless test that uses sound waves to create images of your heart. It provides your doctor with information about the size and shape of your heart and how well your heart's chambers and valves are working. This procedure takes approximately one hour. There are no restrictions for this procedure. Please do NOT wear cologne, perfume, aftershave, or lotions (deodorant is allowed). Please arrive 15 minutes prior to your appointment time.  Follow-Up: At [facility], you and your health needs are our priority.  As part of our continuing mission to provide you with exceptional heart care, we have created designated Provider Care Teams.  These Care Teams include your primary Cardiologist (physician) and Advanced Practice Providers (APPs -  Physician Assistants and Nurse Practitioners) who all work together to provide you with the care you need, when you need it.  Your next appointment:   1 year(s)  The format for your next appointment:   In Person  Provider:   Sherren Mocha, MD       Important Information About Sugar

## 2022-12-02 ENCOUNTER — Encounter: Payer: Self-pay | Admitting: Podiatry

## 2022-12-02 ENCOUNTER — Ambulatory Visit (INDEPENDENT_AMBULATORY_CARE_PROVIDER_SITE_OTHER): Payer: Medicare Other

## 2022-12-02 ENCOUNTER — Ambulatory Visit (INDEPENDENT_AMBULATORY_CARE_PROVIDER_SITE_OTHER): Payer: Medicare Other | Admitting: Podiatry

## 2022-12-02 DIAGNOSIS — Z9889 Other specified postprocedural states: Secondary | ICD-10-CM

## 2022-12-02 DIAGNOSIS — I7300 Raynaud's syndrome without gangrene: Secondary | ICD-10-CM

## 2022-12-02 NOTE — Progress Notes (Signed)
Subjective:   Patient ID: [name], female   DOB: 89 y.o.   MRN: [id number]   HPI Patient presents stating that she is developing pain in both of her toes second and fourth and the 1 that we operated on the left can be a little bit bothersome for her   ROS      Objective:  Physical Exam  Neurovascular status intact with red blotchy discoloration second and fourth right and fourth left with the incision site left healing well no corn callus formation     Assessment:  Probability that this is a Raynaud's type condition cannot rule out other pathology     Plan:  H&P x-ray reviewed and discussed Raynaud's and the importance of avoiding cold exposure.  Patient will be seen back to recheck all questions answered today  X-rays indicate that there is satisfactory resection of bone no indications of other pathology

## 2022-12-09 DIAGNOSIS — K4090 Unilateral inguinal hernia, without obstruction or gangrene, not specified as recurrent: Secondary | ICD-10-CM | POA: Diagnosis not present

## 2022-12-09 DIAGNOSIS — R1030 Lower abdominal pain, unspecified: Secondary | ICD-10-CM | POA: Diagnosis not present

## 2022-12-20 ENCOUNTER — Other Ambulatory Visit: Payer: Self-pay | Admitting: Cardiovascular Disease

## 2023-01-01 DIAGNOSIS — Z6821 Body mass index (BMI) 21.0-21.9, adult: Secondary | ICD-10-CM | POA: Diagnosis not present

## 2023-01-01 DIAGNOSIS — Z1231 Encounter for screening mammogram for malignant neoplasm of breast: Secondary | ICD-10-CM | POA: Diagnosis not present

## 2023-01-01 DIAGNOSIS — Z01419 Encounter for gynecological examination (general) (routine) without abnormal findings: Secondary | ICD-10-CM | POA: Diagnosis not present

## 2023-01-15 DIAGNOSIS — Z85828 Personal history of other malignant neoplasm of skin: Secondary | ICD-10-CM | POA: Diagnosis not present

## 2023-01-15 DIAGNOSIS — D225 Melanocytic nevi of trunk: Secondary | ICD-10-CM | POA: Diagnosis not present

## 2023-01-15 DIAGNOSIS — L821 Other seborrheic keratosis: Secondary | ICD-10-CM | POA: Diagnosis not present

## 2023-01-15 DIAGNOSIS — L565 Disseminated superficial actinic porokeratosis (DSAP): Secondary | ICD-10-CM | POA: Diagnosis not present

## 2023-01-15 DIAGNOSIS — L570 Actinic keratosis: Secondary | ICD-10-CM | POA: Diagnosis not present

## 2023-01-16 DIAGNOSIS — E785 Hyperlipidemia, unspecified: Secondary | ICD-10-CM | POA: Diagnosis not present

## 2023-01-16 DIAGNOSIS — I872 Venous insufficiency (chronic) (peripheral): Secondary | ICD-10-CM | POA: Diagnosis not present

## 2023-01-16 DIAGNOSIS — K4090 Unilateral inguinal hernia, without obstruction or gangrene, not specified as recurrent: Secondary | ICD-10-CM | POA: Diagnosis not present

## 2023-01-16 DIAGNOSIS — R600 Localized edema: Secondary | ICD-10-CM | POA: Diagnosis not present

## 2023-01-16 DIAGNOSIS — M810 Age-related osteoporosis without current pathological fracture: Secondary | ICD-10-CM | POA: Diagnosis not present

## 2023-01-16 DIAGNOSIS — F4320 Adjustment disorder, unspecified: Secondary | ICD-10-CM | POA: Diagnosis not present

## 2023-02-03 ENCOUNTER — Other Ambulatory Visit (HOSPITAL_COMMUNITY): Payer: Self-pay | Admitting: *Deleted

## 2023-02-05 ENCOUNTER — Encounter (HOSPITAL_COMMUNITY)
Admission: RE | Admit: 2023-02-05 | Discharge: 2023-02-05 | Disposition: A | Discharge status: Home / Self Care | Payer: Medicare Other | Source: Ambulatory Visit | Attending: Internal Medicine | Admitting: Internal Medicine

## 2023-02-05 DIAGNOSIS — M810 Age-related osteoporosis without current pathological fracture: Secondary | ICD-10-CM | POA: Diagnosis not present

## 2023-02-05 MED ORDER — DENOSUMAB 60 MG/ML ~~LOC~~ SOSY
60.0000 mg | PREFILLED_SYRINGE | Freq: Once | SUBCUTANEOUS | Status: AC
  Administered 2023-02-05: 60 mg via SUBCUTANEOUS

## 2023-02-05 MED ORDER — DENOSUMAB 60 MG/ML ~~LOC~~ SOSY
PREFILLED_SYRINGE | SUBCUTANEOUS | Status: AC
  Filled 2023-02-05: qty 1

## 2023-03-17 DIAGNOSIS — H524 Presbyopia: Secondary | ICD-10-CM | POA: Diagnosis not present

## 2023-03-17 DIAGNOSIS — H3540 Unspecified peripheral retinal degeneration: Secondary | ICD-10-CM | POA: Diagnosis not present

## 2023-03-17 DIAGNOSIS — H52223 Regular astigmatism, bilateral: Secondary | ICD-10-CM | POA: Diagnosis not present

## 2023-03-17 DIAGNOSIS — H5212 Myopia, left eye: Secondary | ICD-10-CM | POA: Diagnosis not present

## 2023-03-17 DIAGNOSIS — Z961 Presence of intraocular lens: Secondary | ICD-10-CM | POA: Diagnosis not present

## 2023-03-17 DIAGNOSIS — H53143 Visual discomfort, bilateral: Secondary | ICD-10-CM | POA: Diagnosis not present

## 2023-03-17 DIAGNOSIS — Z9849 Cataract extraction status, unspecified eye: Secondary | ICD-10-CM | POA: Diagnosis not present

## 2023-03-25 ENCOUNTER — Telehealth: Payer: Self-pay | Admitting: Orthopaedic Surgery

## 2023-03-25 NOTE — Telephone Encounter (Signed)
Note faxed  to [phone] - Dr. Lucky Cowboy advising.  Also explained to office staff that if Dr. Lucky Cowboy requires antibiotic, patient should do what he recommends.

## 2023-03-25 NOTE — Telephone Encounter (Signed)
I called patient and advised Dr. Ophelia Charter does not prescribe antibiotics for dental prophylaxis. She states that Dr. Lucky Cowboy sent her in an antibiotic. I advised he may want her to take it and that she should speak with him regarding that. Explained it is not required post procedure by Dr. Ophelia Charter. Patient expressed understanding.

## 2023-03-25 NOTE — Telephone Encounter (Signed)
Patients advising she has a dental cleaning next week and is asking if she need to be on Amox. / per her dentist. Please call

## 2023-04-19 ENCOUNTER — Emergency Department (HOSPITAL_BASED_OUTPATIENT_CLINIC_OR_DEPARTMENT_OTHER)
Admission: EM | Admit: 2023-04-19 | Discharge: 2023-04-19 | Disposition: A | Discharge status: Home / Self Care | Payer: Medicare Other | Attending: Emergency Medicine | Admitting: Emergency Medicine

## 2023-04-19 ENCOUNTER — Encounter (HOSPITAL_BASED_OUTPATIENT_CLINIC_OR_DEPARTMENT_OTHER): Payer: Self-pay

## 2023-04-19 ENCOUNTER — Emergency Department (HOSPITAL_BASED_OUTPATIENT_CLINIC_OR_DEPARTMENT_OTHER): Payer: Medicare Other

## 2023-04-19 DIAGNOSIS — Z96642 Presence of left artificial hip joint: Secondary | ICD-10-CM | POA: Diagnosis not present

## 2023-04-19 DIAGNOSIS — K4090 Unilateral inguinal hernia, without obstruction or gangrene, not specified as recurrent: Secondary | ICD-10-CM | POA: Diagnosis not present

## 2023-04-19 DIAGNOSIS — K449 Diaphragmatic hernia without obstruction or gangrene: Secondary | ICD-10-CM | POA: Diagnosis not present

## 2023-04-19 DIAGNOSIS — R109 Unspecified abdominal pain: Secondary | ICD-10-CM | POA: Diagnosis not present

## 2023-04-19 DIAGNOSIS — R1031 Right lower quadrant pain: Secondary | ICD-10-CM | POA: Diagnosis present

## 2023-04-19 LAB — COMPREHENSIVE METABOLIC PANEL
ALT: 11 U/L (ref 0–44)
AST: 17 U/L (ref 15–41)
Albumin: 4.4 g/dL (ref 3.5–5.0)
Alkaline Phosphatase: 54 U/L (ref 38–126)
Anion gap: 7 (ref 5–15)
BUN: 20 mg/dL (ref 8–23)
CO2: 27 mmol/L (ref 22–32)
Calcium: 9.9 mg/dL (ref 8.9–10.3)
Chloride: 106 mmol/L (ref 98–111)
Creatinine, Ser: 0.76 mg/dL (ref 0.44–1.00)
GFR, Estimated: >60 mL/min (ref >60)
Glucose, Bld: 99 mg/dL (ref 70–99)
Potassium: 3.9 mmol/L (ref 3.5–5.1)
Sodium: 140 mmol/L (ref 135–145)
Total Bilirubin: 0.6 mg/dL (ref 0.3–1.2)
Total Protein: 7.3 g/dL (ref 6.5–8.1)

## 2023-04-19 LAB — URINALYSIS, ROUTINE W REFLEX MICROSCOPIC
Bilirubin Urine: NEGATIVE
Glucose, UA: NEGATIVE mg/dL
Hgb urine dipstick: NEGATIVE
Ketones, ur: NEGATIVE mg/dL
Leukocytes,Ua: NEGATIVE
Nitrite: NEGATIVE
Protein, ur: NEGATIVE mg/dL
Specific Gravity, Urine: 1.009 (ref 1.005–1.030)
pH: 7 (ref 5.0–8.0)

## 2023-04-19 LAB — CBC WITH DIFFERENTIAL/PLATELET
Abs Immature Granulocytes: 0.01 10*3/uL (ref 0.00–0.07)
Basophils Absolute: 0.1 10*3/uL (ref 0.0–0.1)
Basophils Relative: 1 %
Eosinophils Absolute: 0.1 10*3/uL (ref 0.0–0.5)
Eosinophils Relative: 1 %
HCT: 41.6 % (ref 36.0–46.0)
Hemoglobin: 13.7 g/dL (ref 12.0–15.0)
Immature Granulocytes: 0 %
Lymphocytes Relative: 19 %
Lymphs Abs: 1.4 10*3/uL (ref 0.7–4.0)
MCH: 30.3 pg (ref 26.0–34.0)
MCHC: 32.9 g/dL (ref 30.0–36.0)
MCV: 92 fL (ref 80.0–100.0)
Monocytes Absolute: 0.6 10*3/uL (ref 0.1–1.0)
Monocytes Relative: 8 %
Neutro Abs: 5.1 10*3/uL (ref 1.7–7.7)
Neutrophils Relative %: 71 %
Platelets: 226 10*3/uL (ref 150–400)
RBC: 4.52 MIL/uL (ref 3.87–5.11)
RDW: 14 % (ref 11.5–15.5)
WBC: 7.2 10*3/uL (ref 4.0–10.5)
nRBC: 0 % (ref 0.0–0.2)

## 2023-04-19 LAB — LIPASE, BLOOD: Lipase: 27 U/L (ref 11–51)

## 2023-04-19 LAB — LACTIC ACID, PLASMA
Lactic Acid, Venous: 0.9 mmol/L (ref 0.5–1.9)
Lactic Acid, Venous: 1.3 mmol/L (ref 0.5–1.9)

## 2023-04-19 MED ORDER — IOHEXOL 300 MG/ML  SOLN
100.0000 mL | Freq: Once | INTRAMUSCULAR | Status: AC | PRN
  Administered 2023-04-19: 80 mL via INTRAVENOUS

## 2023-04-19 NOTE — ED Notes (Signed)
Report to [employer] charge

## 2023-04-19 NOTE — ED Triage Notes (Signed)
She c/o chronic right-sided inguinal and umbilical hernias. She is here today with c/o recalcitrant right inguinal hernia discomfort today. She tells me "I can usually push it back in, but it doesn't feel like it's going back in today". She is ambulatory and in no distress. She denies fever/n/v/d.

## 2023-04-19 NOTE — Discharge Instructions (Addendum)
You will be contacted in the coming days from our surgery colleagues office.  Please follow-up as directed.  Return here for concerning changes in your condition.     GROIN HERNIA REPAIR POST OPERATIVE INSTRUCTIONS  Thinking Clearly  The anesthesia may cause you to feel different for 1 or 2 days. Do not drive, drink alcohol, or make any big decisions for at least 2 days.  Nutrition When you wake up, you will be able to drink small amounts of liquid. If you do not feel sick, you can slowly advance your diet to regular foods. Continue to drink lots of fluids, usually about 8 to 10 glasses per day. Eat a high-fiber diet so you don't strain during bowel movements. High-Fiber Foods Foods high in fiber include beans, bran cereals and whole-grain breads, peas, dried fruit (figs, apricots, and dates), raspberries, blackberries, strawberries, sweet corn, broccoli, baked potatoes with skin, plums, pears, apples, greens, and nuts. Activity Slowly increase your activity. Be sure to get up and walk every hour or so to prevent blood clots. No heavy lifting or strenuous activity for 4 weeks following surgery to prevent hernias at your incision sites or recurrence of your hernia. It is normal to feel tired. You may need more sleep than usual.  Get your rest but make sure to get up and move around frequently to prevent blood clots and pneumonia.  Work and Return to [employer] can go back to work when you feel well enough. Discuss the timing with your surgeon. You can usually go back to school or work 1 week or less after an laparoscopic or an open repair. If your work requires heavy lifting or strenuous activity you need to be placed on light duty for 4 weeks following surgery. You can return to gym class, sports or other physical activities 4 weeks after surgery.  Wound Care You may experience significant bruising in the groin including into the scrotum in males.  Rest, elevating the groin and scrotum  above the level of the heart, ice and compression with tight fitting underwear can help.  Always wash your hands before and after touching near your incision site. Do not soak in a bathtub until cleared at your follow up appointment. You may take a shower 24 hours after surgery. A small amount of drainage from the incision is normal. If the drainage is thick and yellow or the site is red, you may have an infection, so call your surgeon. If you have a drain in one of your incisions, it will be taken out in office when the drainage stops. Steri-Strips will fall off in 7 to 10 days or they will be removed during your first office visit. If you have dermabond glue covering over the incision, allow the glue to flake off on its own. Protect the new skin, especially from the sun. The sun can burn and cause darker scarring. Your scar will heal in about 4 to 6 weeks and will become softer and continue to fade over the next year.  The cosmetic appearance of the incisions will improve over the course of the first year after surgery. Sensation around your incision will return in a few weeks or months.  Bowel Movements After intestinal surgery, you may have loose watery stools for several days. If watery diarrhea lasts longer than 3 days, contact your surgeon. Pain medication (narcotics) can cause constipation. Increase the fiber in your diet with high-fiber foods if you are constipated. You can take an over the  counter stool softener like Colace to avoid constipation.  Additional over the counter medications can also be used if Colace isn't sufficient (for example, Milk of Magnesia or Miralax).  Pain The amount of pain is different for each person. Some people need only 1 to 3 doses of pain control medication, while others need more. Take alternating doses of tylenol and ibuprofen around the clock for the first five days following surgery.  This will provide a baseline of pain control and help with  inflammation.  Take the narcotic pain medication in addition if needed for severe pain.  Contact Your Surgeon at [phone], if you have: Pain that will not go away Pain that gets worse A fever of more than 101F (38.3C) Repeated vomiting Swelling, redness, bleeding, or bad-smelling drainage from your wound site Strong abdominal pain No bowel movement or unable to pass gas for 3 days Watery diarrhea lasting longer than 3 days  Pain Control The goal of pain control is to minimize pain, keep you moving and help you heal. Your surgical team will work with you on your pain plan. Most often a combination of therapies and medications are used to control your pain. You may also be given medication (local anesthetic) at the surgical site. This may help control your pain for several days. Extreme pain puts extra stress on your body at a time when your body needs to focus on healing. Do not wait until your pain has reached a level "10" or is unbearable before telling your doctor or nurse. It is much easier to control pain before it becomes severe. Following a laparoscopic procedure, pain is sometimes felt in the shoulder. This is due to the gas inserted into your abdomen during the procedure. Moving and walking helps to decrease the gas and the right shoulder pain.  Use the guide below for ways to manage your post-operative pain. Learn more by going to facs.org/safepaincontrol.  How Intense Is My Pain Common Therapies to Feel Better       I hardly notice my pain, and it does not interfere with my activities.  I notice my pain and it distracts me, but I can still do activities (sitting up, walking, standing).  Non-Medication Therapies  Ice (in a bag, applied over clothing at the surgical site), elevation, rest, meditation, massage, distraction (music, TV, play) walking and mild exercise Splinting the abdomen with pillows +  Non-Opioid Medications Acetaminophen (Tylenol) Non-steroidal  anti-inflammatory drugs (NSAIDS) Aspirin, Ibuprofen (Motrin, Advil) Naproxen (Aleve) Take these as needed, when you feel pain. Both acetaminophen and NSAIDs help to decrease pain and swelling (inflammation).      My pain is hard to ignore and is more noticeable even when I rest.  My pain interferes with my usual activities.  Non-Medication Therapies  +  Non-Opioid medications  Take on a regular schedule (around-the-clock) instead of as needed. (For example, Tylenol every 6 hours at 9:00 am, 3:00 pm, 9:00 pm, 3:00 am and Motrin every 6 hours at 12:00 am, 6:00 am, 12:00 pm, 6:00 pm)         I am focused on my pain, and I am not doing my daily activities.  I am groaning in pain, and I cannot sleep. I am unable to do anything.  My pain is as bad as it could be, and nothing else matters.  Non-Medication Therapies  +  Around-the-Clock Non-Opioid Medications  +  Short-acting opioids  Opioids should be used with other medications to manage severe pain. Opioids  block pain and give a feeling of euphoria (feel high). Addiction, a serious side effect of opioids, is rare with short-term (a few days) use.  Examples of short-acting opioids include: Tramadol (Ultram), Hydrocodone (Norco, Vicodin), Hydromorphone (Dilaudid), Oxycodone (Oxycontin)     The above directions have been adapted from the [employer] Surgical Patient Education Program.  Please refer to the ACS website if needed: http://chapman.info/.ashx   Ivar Drape, MD [facility] [address], [address], [place], [place]  [address] ?  [address] [address], [place], [place]   [address] [phone] ? [phone] ? FAX [phone] Web site: [email or web address]

## 2023-04-19 NOTE — ED Provider Notes (Addendum)
[place] EMERGENCY DEPARTMENT AT [facility] Provider Note   CSN: [id number] Arrival date & time: 04/19/23  3086     History  Chief Complaint  Patient presents with   hernia problem    ELASIA VANDEWALKER is a 89 y.o. female with a past medical history of umbilical and inguinal hernia, hyperlipidemia, presents today for evaluation of her ventral hernia.  Patient reports that today she could not push her inguinal hernia back in.  She contacted her PCP and was advised to go to the ER for evaluation.  She denies fever, nausea, vomiting, chest pain or shortness of breath.  Last bowel movement was yesterday, normal stool, denies any blood in her stool or urine.  She reports mild right lower quadrant abdominal pain.  No history of hernia repair.  History of abdominal hysterectomy.  HPI    Past Medical History:  Diagnosis Date   Arthritis    Cataract    Hx of colonic polyps    Hypercholesterolemia    Lumbar back pain    Neck pain    Osteoporosis    Swelling of ankle 01/12/16   bilateral. Pt is taking lasix x 2 weeks and wearing compression stockings   Past Surgical History:  Procedure Laterality Date   ABDOMINAL HYSTERECTOMY  12/2003   DR. Neal--bso, A/P repair   cataract surgery     COLONOSCOPY     HIP ARTHROPLASTY Left 01/28/2021   Procedure: ARTHROPLASTY BIPOLAR HIP (HEMIARTHROPLASTY);  Surgeon: Eldred Manges, MD;  Location: [facility];  Service: Orthopedics;  Laterality: Left;  depuy, Lateral position with Mark II   LUMBAR LAMINECTOMY  1985   Dr. Darrelyn Hillock   POLYPECTOMY     surgery for removal of rectal polyp  07/1995   Dr. Cindee Lame   TUBAL LIGATION       Home Medications Prior to Admission medications   Medication Sig Start Date End Date Taking? Authorizing Provider  BD PEN NEEDLE NANO 2ND GEN 32G X 4 MM MISC SMARTSIG:Pre-Filled Pen Syringe SUB-Q Daily 05/23/21   [provider]  Calcium Carbonate-Vitamin D (CALCIUM-D PO) Take 1 tablet by mouth 2 (two) times  daily.    [provider]  Cholecalciferol (VITAMIN D3 PO) Take 1 tablet by mouth daily.    [provider]  denosumab (PROLIA) 60 MG/ML SOSY injection Inject 60mg  every 6 months (start January 2016) two shots as of 12/16 still on it regularly in 2020, last was 09/22/19 at [facility] Patient not taking: Reported on 10/09/2021 11/23/14   [provider]  docusate sodium (COLACE) 100 MG capsule Take 1 capsule (100 mg total) by mouth 2 (two) times daily. 01/30/21   Narda Bonds, MD  doxycycline (VIBRA-TABS) 100 MG tablet Take 1 tablet (100 mg total) by mouth 2 (two) times daily. 05/02/22   Lenn Sink, DPM  ezetimibe (ZETIA) 10 MG tablet TAKE 1 TABLET DAILY 12/20/22   Tonny Bollman, MD  Glucosamine-Chondroitin (MOVE FREE PO) Take 1 tablet by mouth daily.    [provider]  Multiple Vitamin (MULTIVITAMIN WITH MINERALS) TABS tablet Take 1 tablet by mouth daily.    [provider]  rosuvastatin (CRESTOR) 5 MG tablet Take 5 mg by mouth daily. 08/06/21   [provider]  TURMERIC PO Take 1 capsule by mouth 2 (two) times daily.    [provider]      Allergies    Risedronate sodium, Cephalexin, Oxycodone, and Hydrocodone    Review of Systems  Review of Systems Negative except as per HPI.  Physical Exam Updated Vital Signs BP (!) 173/87   Pulse 68   Temp 97.8 F (36.6 C) (Oral)   Resp 18   Ht 4' 10.5" (1.486 m)   Wt 45.8 kg   SpO2 100%   BMI 20.75 kg/m  Physical Exam Vitals and nursing note reviewed.  Constitutional:      Appearance: Normal appearance.  HENT:     Head: Normocephalic and atraumatic.     Mouth/Throat:     Mouth: Mucous membranes are moist.  Eyes:     General: No scleral icterus. Cardiovascular:     Rate and Rhythm: Normal rate and regular rhythm.     Pulses: Normal pulses.     Heart sounds: Normal heart sounds.  Pulmonary:     Effort: Pulmonary effort is normal.     Breath sounds: Normal breath sounds.   Abdominal:     General: Abdomen is flat.     Palpations: Abdomen is soft.     Tenderness: There is abdominal tenderness in the right lower quadrant and suprapubic area.     Comments: Non reducible inguinal hernia in the right groin.  No overlying skin erythema.  Skin is cool to touch.  Musculoskeletal:        General: No deformity.  Skin:    General: Skin is warm.     Findings: No rash.  Neurological:     General: No focal deficit present.     Mental Status: She is alert.  Psychiatric:        Mood and Affect: Mood normal.     ED Results / Procedures / Treatments   Labs (all labs ordered are listed, but only abnormal results are displayed) Labs Reviewed  COMPREHENSIVE METABOLIC PANEL  LIPASE, BLOOD  CBC WITH DIFFERENTIAL/PLATELET  URINALYSIS, ROUTINE W REFLEX MICROSCOPIC  LACTIC ACID, PLASMA  LACTIC ACID, PLASMA    EKG None  Radiology CT ABDOMEN PELVIS W CONTRAST  Result Date: 04/19/2023 CLINICAL DATA:  Right sided inguinal and umbilical hernias with pain. EXAM: CT ABDOMEN AND PELVIS WITH CONTRAST TECHNIQUE: Multidetector CT imaging of the abdomen and pelvis was performed using the standard protocol following bolus administration of intravenous contrast. RADIATION DOSE REDUCTION: This exam was performed according to the departmental dose-optimization program which includes automated exposure control, adjustment of the mA and/or kV according to patient size and/or use of iterative reconstruction technique. CONTRAST:  80mL OMNIPAQUE IOHEXOL 300 MG/ML  SOLN COMPARISON:  05/07/2021 FINDINGS: Lower chest: Moderate hiatal hernia. Hepatobiliary: No suspicious focal abnormality within the liver parenchyma. There is no evidence for gallstones, gallbladder wall thickening, or pericholecystic fluid. Probable adenomyomatosis in the region of the gallbladder fundus. No intrahepatic or extrahepatic biliary dilation. Pancreas: No focal mass lesion. No dilatation of the main duct. No  intraparenchymal cyst. No peripancreatic edema. Spleen: No splenomegaly. No focal mass lesion. Adrenals/Urinary Tract: No adrenal nodule or mass. Tiny low-density lesions in the kidneys are too small to characterize but statistically most likely benign. No followup imaging is recommended. No evidence for hydroureter. The urinary bladder appears normal for the degree of distention. Stomach/Bowel: Moderate hiatal hernia. Duodenum is normally positioned as is the ligament of Treitz. No small bowel wall thickening. No small bowel dilatation. Cecum is in the anterior midline pelvis. The terminal ileum is normal. The appendix is normal. No gross colonic mass. No colonic wall thickening. Prominent stool volume. Vascular/Lymphatic: There is moderate atherosclerotic calcification of the abdominal aorta without aneurysm.  There is no gastrohepatic or hepatoduodenal ligament lymphadenopathy. No retroperitoneal or mesenteric lymphadenopathy. No pelvic sidewall lymphadenopathy. Reproductive: Hysterectomy.  There is no adnexal mass. Other: No intraperitoneal free fluid. Musculoskeletal: Pelvic floor laxity with cystocele. Complex right groin hernia noted with hernia sac measuring approximately 3.5 x 2.2 x 5.9 cm. There appears to be some small bowel retracted down into the proximal portion of the hernia sac without evidence for obstruction or bowel wall thickening. The more distal/inferior aspect the hernia sac is filled with fat in fluid. Small left groin hernia contains only fat. Tiny periumbilical midline ventral hernia contains only fat. No worrisome lytic or sclerotic osseous abnormality. Diffuse degenerative disc disease noted lumbar spine. IMPRESSION: 1. Complex right groin hernia with hernia sac measuring approximately 3.5 x 2.2 x 5.9 cm. There appears to be some small bowel retracted down into the proximal portion of the hernia sac without evidence for obstruction or bowel wall thickening. The more distal/inferior aspect  of the hernia sac is filled with fat and fluid. Given the fluid in the hernia sac, a component of incarceration is not excluded. 2. Small left groin hernia contains only fat. 3. Tiny periumbilical midline ventral hernia contains only fat. 4. Moderate hiatal hernia. 5. Pelvic floor laxity with cystocele. 6.  Aortic Atherosclerosis (ICD10-I70.0). Electronically Signed   By: Kennith Center M.D.   On: 04/19/2023 12:33    Procedures Procedures    Medications Ordered in ED Medications  iohexol (OMNIPAQUE) 300 MG/ML solution 100 mL (80 mLs Intravenous Contrast Given 04/19/23 1132)    ED Course/ Medical Decision Making/ A&P                             Medical Decision Making Amount and/or Complexity of Data Reviewed Labs: ordered. Radiology: ordered.  Risk Prescription drug management.   This patient presents to the ED for hernia, this involves an extensive number of treatment options, and is a complaint that carries with a high risk of complications and morbidity.  The differential diagnosis includes direct inguinal hernia, indirect inguinal hernia, diverticulitis, SBO, incarcerated hernia, strangulated hernia, appendicitis.  This is not an exhaustive list.  Lab tests: I ordered and personally interpreted labs.  The pertinent results include: WBC unremarkable. Hbg unremarkable. Platelets unremarkable. Electrolytes unremarkable. BUN, creatinine unremarkable.  Lactic acid normal.  Lipase normal.  Urinalysis unremarkable.  Imaging studies: I ordered imaging studies. I personally reviewed, interpreted imaging and agree with the radiologist's interpretations. The results include: CT abdomen pelvis showed a right inguinal hernia, cannot exclude incarceration.  Problem list/ ED course/ Critical interventions/ Medical management: HPI: See above Vital signs within normal range and stable throughout visit. Laboratory/imaging studies significant for: See above. On physical examination, patient is  afebrile and appears in no acute distress.  There was a nonreducible hernia in the right groin.  There was also tenderness to palpation to the right lower quadrant and suprapubic area.  There was no overlying skin erythema.  CT abdomen pelvis showed a right inguinal hernia measuring 3.5 2.2 x 6 cm with some small bowel retracted into the hernia sac, cannot exclude incarceration.  CBC with no leukocytosis or anemia.  CMP with no acute electrolyte or AKI.  Lactic acid normal.  Patient has no nausea or vomiting.  Pain is minimal per patient.  Will consult general surgery.  Patient states she would like to drive to [facility] herself, refused to be transferred by the ambulance which is reasonable  given patient's current conditions. I have reviewed the patient home medicines and have made adjustments as needed.  Cardiac monitoring/EKG: The patient was maintained on a cardiac monitor.  I personally reviewed and interpreted the cardiac monitor which showed an underlying rhythm of: sinus rhythm.  Additional history obtained: External records from outside source obtained and reviewed including: Chart review including previous notes, labs, imaging.  Consultations obtained: I requested consultation with Dr. Alfredo Bach general surgery, and discussed lab and imaging findings as well as pertinent plan.  He recommended ED to ED transfer and he will see patient at [facility] ED. I spoke to Dr. Jarold Motto who agreed to admit patient ED to ED.  Disposition ED to ED admission.  Patient requested to go to [facility] POV. This chart was dictated using voice recognition software.  Despite best efforts to proofread,  errors can occur which can change the documentation meaning.          Final Clinical Impression(s) / ED Diagnoses Final diagnoses:  Right inguinal hernia    Rx / DC Orders ED Discharge Orders     None         Jeanelle Malling, [place] 04/19/23 1412    Jeanelle Malling, PA 04/19/23 1413    Mardene Sayer, MD 04/19/23  [phone]

## 2023-04-19 NOTE — ED Notes (Signed)
IV is secured . Pt understands to not eat anyyhing and to go straight to the ER at [facility], pt has her paper work, pt is going POV

## 2023-04-19 NOTE — Consult Note (Signed)
Consulting Physician: Hyman Hopes Isais Klipfel  Referring Provider: Dr. Jeraldine Loots  Chief Complaint: Painful right inguinal hernia  Reason for Consult: Painful right inguinal hernia   Subjective   HPI: [name] is an 89 y.o. female who is here for a painful right inguinal hernia.  It recently feels that it is bigger and has been more symptomatic recently.  She was doing more work around the house and felt this may have exacerbated it.  She went to [facility] and was transferred to [facility] for surgical consultation.  Past Medical History:  Diagnosis Date   Arthritis    Cataract    Hx of colonic polyps    Hypercholesterolemia    Lumbar back pain    Neck pain    Osteoporosis    Swelling of ankle 01/12/16   bilateral. Pt is taking lasix x 2 weeks and wearing compression stockings    Past Surgical History:  Procedure Laterality Date   ABDOMINAL HYSTERECTOMY  12/2003   DR. Neal--bso, A/P repair   cataract surgery     COLONOSCOPY     HIP ARTHROPLASTY Left 01/28/2021   Procedure: ARTHROPLASTY BIPOLAR HIP (HEMIARTHROPLASTY);  Surgeon: Eldred Manges, MD;  Location: [facility];  Service: Orthopedics;  Laterality: Left;  depuy, Lateral position with Mark II   LUMBAR LAMINECTOMY  1985   Dr. Darrelyn Hillock   POLYPECTOMY     surgery for removal of rectal polyp  07/1995   Dr. Cindee Lame   TUBAL LIGATION      Family History  Problem Relation Age of Onset   Stroke Mother    Heart Problems Sister        triple bipass   Heart Problems Brother        blocked arteries, stent.   Stomach cancer Neg Hx    Colon cancer Neg Hx    Pancreatic cancer Neg Hx    Rectal cancer Neg Hx    Colon polyps Neg Hx    Esophageal cancer Neg Hx     Social:  reports that she quit smoking about 68 years ago. Her smoking use included cigarettes. She has a 0.25 pack-year smoking history. She has never used smokeless tobacco. She reports current alcohol use of about 7.0 standard drinks of alcohol per week. She  reports that she does not use drugs.  Allergies:  Allergies  Allergen Reactions   Risedronate Sodium Other (See Comments)    Reaction to Actonel   Cephalexin Other (See Comments)    Upset stomach and made very tired   Oxycodone Other (See Comments)   Hydrocodone Nausea Only    Medications: Current Outpatient Medications  Medication Instructions   BD PEN NEEDLE NANO 2ND GEN 32G X 4 MM MISC SMARTSIG:Pre-Filled Pen Syringe SUB-Q Daily   Calcium Carbonate-Vitamin D (CALCIUM-D PO) 1 tablet, Oral, 2 times daily   Cholecalciferol (VITAMIN D3 PO) 1 tablet, Oral, Daily   denosumab (PROLIA) 60 MG/ML SOSY injection Inject 60mg  every 6 months (start January 2016) two shots as of 12/16 still on it regularly in 2020, last was 09/22/19 at [facility]   docusate sodium (COLACE) 100 mg, Oral, 2 times daily   doxycycline (VIBRA-TABS) 100 mg, Oral, 2 times daily   ezetimibe (ZETIA) 10 mg, Oral, Daily   Glucosamine-Chondroitin (MOVE FREE PO) 1 tablet, Oral, Daily   Multiple Vitamin (MULTIVITAMIN WITH MINERALS) TABS tablet 1 tablet, Oral, Daily   rosuvastatin (CRESTOR) 5 mg, Oral, Daily   TURMERIC PO 1 capsule, Oral, 2 times daily  ROS - all of the below systems have been reviewed with the patient and positives are indicated with bold text General: chills, fever or night sweats Eyes: blurry vision or double vision ENT: epistaxis or sore throat Allergy/Immunology: itchy/watery eyes or nasal congestion Hematologic/Lymphatic: bleeding problems, blood clots or swollen lymph nodes Endocrine: temperature intolerance or unexpected weight changes Breast: new or changing breast lumps or nipple discharge Resp: cough, shortness of breath, or wheezing CV: chest pain or dyspnea on exertion GI: as per HPI GU: dysuria, trouble voiding, or hematuria MSK: joint pain or joint stiffness Neuro: TIA or stroke symptoms Derm: pruritus and skin lesion changes Psych: anxiety and depression  Objective   PE Blood  pressure 134/84, pulse 67, temperature 97.8 F (36.6 C), temperature source Oral, resp. rate 18, height 4' 10.5" (1.486 m), weight 45.8 kg, SpO2 97 %. Constitutional: NAD; conversant; no deformities Eyes: Moist conjunctiva; no lid lag; anicteric; PERRL Neck: Trachea midline; no thyromegaly Lungs: Normal respiratory effort; no tactile fremitus CV: RRR; no palpable thrills; no pitting edema GI: Abd Easily reducible right inguinal hernia; no palpable hepatosplenomegaly MSK: Normal range of motion of extremities; no clubbing/cyanosis Psychiatric: Appropriate affect; alert and oriented x3 Lymphatic: No palpable cervical or axillary lymphadenopathy  Results for orders placed or performed during the hospital encounter of 04/19/23 (from the past 24 hour(s))  Comprehensive metabolic panel     Status: None   Collection Time: 04/19/23 10:47 AM  Result Value Ref Range   Sodium 140 135 - 145 mmol/L   Potassium 3.9 3.5 - 5.1 mmol/L   Chloride 106 98 - 111 mmol/L   CO2 27 22 - 32 mmol/L   Glucose, Bld 99 70 - 99 mg/dL   BUN 20 8 - 23 mg/dL   Creatinine, Ser 1.61 0.44 - 1.00 mg/dL   Calcium 9.9 8.9 - 09.6 mg/dL   Total Protein 7.3 6.5 - 8.1 g/dL   Albumin 4.4 3.5 - 5.0 g/dL   AST 17 15 - 41 U/L   ALT 11 0 - 44 U/L   Alkaline Phosphatase 54 38 - 126 U/L   Total Bilirubin 0.6 0.3 - 1.2 mg/dL   GFR, Estimated >04 >54 mL/min   Anion gap 7 5 - 15  Lipase, blood     Status: None   Collection Time: 04/19/23 10:47 AM  Result Value Ref Range   Lipase 27 11 - 51 U/L  CBC with Diff     Status: None   Collection Time: 04/19/23 10:47 AM  Result Value Ref Range   WBC 7.2 4.0 - 10.5 K/uL   RBC 4.52 3.87 - 5.11 MIL/uL   Hemoglobin 13.7 12.0 - 15.0 g/dL   HCT 09.8 11.9 - 14.7 %   MCV 92.0 80.0 - 100.0 fL   MCH 30.3 26.0 - 34.0 pg   MCHC 32.9 30.0 - 36.0 g/dL   RDW 82.9 56.2 - 13.0 %   Platelets 226 150 - 400 K/uL   nRBC 0.0 0.0 - 0.2 %   Neutrophils Relative % 71 %   Neutro Abs 5.1 1.7 - 7.7 K/uL    Lymphocytes Relative 19 %   Lymphs Abs 1.4 0.7 - 4.0 K/uL   Monocytes Relative 8 %   Monocytes Absolute 0.6 0.1 - 1.0 K/uL   Eosinophils Relative 1 %   Eosinophils Absolute 0.1 0.0 - 0.5 K/uL   Basophils Relative 1 %   Basophils Absolute 0.1 0.0 - 0.1 K/uL   Immature Granulocytes 0 %  Abs Immature Granulocytes 0.01 0.00 - 0.07 K/uL  Urinalysis, Routine w reflex microscopic -Urine, Clean Catch     Status: None   Collection Time: 04/19/23 10:47 AM  Result Value Ref Range   Color, Urine YELLOW YELLOW   APPearance CLEAR CLEAR   Specific Gravity, Urine 1.009 1.005 - 1.030   pH 7.0 5.0 - 8.0   Glucose, UA NEGATIVE NEGATIVE mg/dL   Hgb urine dipstick NEGATIVE NEGATIVE   Bilirubin Urine NEGATIVE NEGATIVE   Ketones, ur NEGATIVE NEGATIVE mg/dL   Protein, ur NEGATIVE NEGATIVE mg/dL   Nitrite NEGATIVE NEGATIVE   Leukocytes,Ua NEGATIVE NEGATIVE  Lactic acid, plasma     Status: None   Collection Time: 04/19/23 10:47 AM  Result Value Ref Range   Lactic Acid, Venous 0.9 0.5 - 1.9 mmol/L  Lactic acid, plasma     Status: None   Collection Time: 04/19/23  2:53 PM  Result Value Ref Range   Lactic Acid, Venous 1.3 0.5 - 1.9 mmol/L     Imaging Orders         CT ABDOMEN PELVIS W CONTRAST    1. Complex right groin hernia with hernia sac measuring approximately 3.5 x 2.2 x 5.9 cm. There appears to be some small bowel retracted down into the proximal portion of the hernia sac without evidence for obstruction or bowel wall thickening. The more distal/inferior aspect of the hernia sac is filled with fat and fluid. Given the fluid in the hernia sac, a component of incarceration is not excluded. 2. Small left groin hernia contains only fat. 3. Tiny periumbilical midline ventral hernia contains only fat. 4. Moderate hiatal hernia. 5. Pelvic floor laxity with cystocele. 6.  Aortic Atherosclerosis (ICD10-I70.0).  Assessment and Plan   [name] is an 89 y.o. female with a right inguinal  hernia.  It is easily reducible but recently more symptomatic.  I offered admission with urgent surgery vs scheduling elective surgery.  She would like to schedule elective surgery.  I recommended laparoscopic bilateral inguinal hernia repair with mesh.  We discussed the procedure, its risks, benefits and alternatives.  After a full discussion and all questions answered the patient granted consent to proceed.  My surgery scheduler will reach out to the patient to schedule surgery.  She can be discharged from the ER.    ICD-10-CM   1. Right inguinal hernia  K40.90        Quentin Ore, MD  [facility] Use [email or web address] to contact on call provider  New Patient Billing: [id number] - High MDM

## 2023-04-19 NOTE — ED Provider Notes (Signed)
Adult female here with her son who assists with the history.  She notes that since this morning her hernia continues to be painful, not reducible.  4:38 PM Patient awake, alert, standing.  She has been seen, evaluated by our trauma team, is appropriate for discharge.  They will expedite follow-up.   Gerhard Munch, MD 04/19/23 [phone]

## 2023-04-22 ENCOUNTER — Telehealth: Payer: Self-pay | Admitting: *Deleted

## 2023-04-22 NOTE — Telephone Encounter (Signed)
Pt called back and has been scheduled for tele pre op appt 04/29/23 2:20. Med rec and consent are done.

## 2023-04-22 NOTE — Telephone Encounter (Signed)
Left message to call back to set up tele pre op appt.  

## 2023-04-22 NOTE — Telephone Encounter (Signed)
   Name: [name]  DOB: [birth year]  MRN: [id number]  Primary Cardiologist: Tonny Bollman, MD  Chart reviewed as part of pre-operative protocol coverage. Because of [name]'s past medical history and time since last visit, she will require a follow-up telephone visit in order to better assess preoperative cardiovascular risk.  Pre-op covering staff: - Please schedule appointment and call patient to inform them. If patient already had an upcoming appointment within acceptable timeframe, please add "pre-op clearance" to the appointment notes so provider is aware. - Please contact requesting surgeon's office via preferred method (i.e, phone, fax) to inform them of need for appointment prior to surgery.  No medications indicated as needing held.  Sharlene Dory, PA-C  04/22/2023, 7:52 AM

## 2023-04-22 NOTE — Telephone Encounter (Signed)
Pt called back and has been scheduled for tele pre op appt 04/29/23 2:20. Med rec and consent are done.     Patient Consent for Virtual Visit        [name] has provided verbal consent on 04/22/2023 for a virtual visit (video or telephone).   CONSENT FOR VIRTUAL VISIT FOR:  [name]  By participating in this virtual visit I agree to the following:  I hereby voluntarily request, consent and authorize [facility] and its employed or contracted physicians, physician assistants, nurse practitioners or other licensed health care professionals (the Practitioner), to provide me with telemedicine health care services (the "Services") as deemed necessary by the treating Practitioner. I acknowledge and consent to receive the Services by the Practitioner via telemedicine. I understand that the telemedicine visit will involve communicating with the Practitioner through live audiovisual communication technology and the disclosure of certain medical information by electronic transmission. I acknowledge that I have been given the opportunity to request an in-person assessment or other available alternative prior to the telemedicine visit and am voluntarily participating in the telemedicine visit.  I understand that I have the right to withhold or withdraw my consent to the use of telemedicine in the course of my care at any time, without affecting my right to future care or treatment, and that the Practitioner or I may terminate the telemedicine visit at any time. I understand that I have the right to inspect all information obtained and/or recorded in the course of the telemedicine visit and may receive copies of available information for a reasonable fee.  I understand that some of the potential risks of receiving the Services via telemedicine include:  Delay or interruption in medical evaluation due to technological equipment failure or disruption; Information transmitted may not be  sufficient (e.g. poor resolution of images) to allow for appropriate medical decision making by the Practitioner; and/or  In rare instances, security protocols could fail, causing a breach of personal health information.  Furthermore, I acknowledge that it is my responsibility to provide information about my medical history, conditions and care that is complete and accurate to the best of my ability. I acknowledge that Practitioner's advice, recommendations, and/or decision may be based on factors not within their control, such as incomplete or inaccurate data provided by me or distortions of diagnostic images or specimens that may result from electronic transmissions. I understand that the practice of medicine is not an exact science and that Practitioner makes no warranties or guarantees regarding treatment outcomes. I acknowledge that a copy of this consent can be made available to me via my patient portal [facility] MyChart), or I can request a printed copy by calling the office of [facility].    I understand that my insurance will be billed for this visit.   I have read or had this consent read to me. I understand the contents of this consent, which adequately explains the benefits and risks of the Services being provided via telemedicine.  I have been provided ample opportunity to ask questions regarding this consent and the Services and have had my questions answered to my satisfaction. I give my informed consent for the services to be provided through the use of telemedicine in my medical care

## 2023-04-22 NOTE — Telephone Encounter (Signed)
   Pre-operative Risk Assessment    Patient Name: [name]  DOB: August 07, 1933 MRN: [id number]      Request for Surgical Clearance    Procedure:   BILATERAL INGUINAL HERNIA SURGERY  Date of Surgery:  Clearance TBD                                 Surgeon:  Ivar Drape, MD Surgeon's Group or Practice Name:  [facility] Phone number:  [phone] Fax number:  [phone]   Type of Clearance Requested:   - Medical    Type of Anesthesia:  General    Additional requests/questions:    [name]   04/22/2023, 7:23 AM

## 2023-04-29 ENCOUNTER — Ambulatory Visit: Payer: Medicare Other | Attending: Cardiovascular Disease | Admitting: Nurse Practitioner

## 2023-04-29 DIAGNOSIS — Z01810 Encounter for preprocedural cardiovascular examination: Secondary | ICD-10-CM | POA: Diagnosis not present

## 2023-04-29 NOTE — Progress Notes (Signed)
Virtual Visit via Telephone Note   Because of [name]'s co-morbid illnesses, she is at least at moderate risk for complications without adequate follow up.  This format is felt to be most appropriate for this patient at this time.  The patient did not have access to video technology/had technical difficulties with video requiring transitioning to audio format only (telephone).  All issues noted in this document were discussed and addressed.  No physical exam could be performed with this format.  Please refer to the patient's chart for her consent to telehealth for [facility].  Evaluation Performed:  Preoperative cardiovascular risk assessment _____________   Date:  04/29/2023   Patient ID:  [name], DOB 17-May-1933, MRN [id number] Patient Location:  Home Provider location:   Office  Primary Care Provider:  Rodrigo Ran, MD Primary Cardiologist:  Tonny Bollman, MD  Chief Complaint / Patient Profile   89 y.o. y/o female with a h/o nonrheumatic aortic stenosis and hyperlipidemia who is pending  BILATERAL INGUINAL HERNIA SURGERY with Dr. Ivar Drape of [facility] and presents today for telephonic preoperative cardiovascular risk assessment.  History of Present Illness    [name] is a 89 y.o. female who presents via audio/video conferencing for a telehealth visit today.  Pt was last seen in cardiology clinic on 10/11/2022 by Dr. Excell Seltzer.  At that time [name] was doing well.  The patient is now pending procedure as outlined above. Since her last visit, she has done well from a cardiac standpoint.   She denies chest pain, palpitations, dyspnea, pnd, orthopnea, n, v, dizziness, syncope, edema, weight gain, or early satiety. All other systems reviewed and are otherwise negative except as noted above.   Past Medical History    Past Medical History:  Diagnosis Date   Arthritis    Cataract    Hx of colonic polyps     Hypercholesterolemia    Lumbar back pain    Neck pain    Osteoporosis    Swelling of ankle 01/12/16   bilateral. Pt is taking lasix x 2 weeks and wearing compression stockings   Past Surgical History:  Procedure Laterality Date   ABDOMINAL HYSTERECTOMY  12/2003   DR. Neal--bso, A/P repair   cataract surgery     COLONOSCOPY     HIP ARTHROPLASTY Left 01/28/2021   Procedure: ARTHROPLASTY BIPOLAR HIP (HEMIARTHROPLASTY);  Surgeon: Eldred Manges, MD;  Location: [facility];  Service: Orthopedics;  Laterality: Left;  depuy, Lateral position with Mark II   LUMBAR LAMINECTOMY  1985   Dr. Darrelyn Hillock   POLYPECTOMY     surgery for removal of rectal polyp  07/1995   Dr. Cindee Lame   TUBAL LIGATION      Allergies  Allergies  Allergen Reactions   Risedronate Sodium Other (See Comments)    Reaction to Actonel   Cephalexin Other (See Comments)    Upset stomach and made very tired   Oxycodone Other (See Comments)   Hydrocodone Nausea Only    Home Medications    Prior to Admission medications   Medication Sig Start Date End Date Taking? Authorizing Provider  BD PEN NEEDLE NANO 2ND GEN 32G X 4 MM MISC SMARTSIG:Pre-Filled Pen Syringe SUB-Q Daily 05/23/21   [provider]  Calcium Carbonate-Vitamin D (CALCIUM-D PO) Take 1 tablet by mouth 2 (two) times daily.    [provider]  Cholecalciferol (VITAMIN D3 PO) Take 1 tablet by mouth daily.  [provider]  denosumab (PROLIA) 60 MG/ML SOSY injection  11/23/14   [provider]  docusate sodium (COLACE) 100 MG capsule Take 1 capsule (100 mg total) by mouth 2 (two) times daily. 01/30/21   Narda Bonds, MD  doxycycline (VIBRA-TABS) 100 MG tablet Take 1 tablet (100 mg total) by mouth 2 (two) times daily. Patient not taking: Reported on 04/22/2023 05/02/22   Lenn Sink, DPM  ezetimibe (ZETIA) 10 MG tablet TAKE 1 TABLET DAILY 12/20/22   Tonny Bollman, MD  Glucosamine-Chondroitin (MOVE FREE PO) Take 1 tablet by mouth  daily.    [provider]  Multiple Vitamin (MULTIVITAMIN WITH MINERALS) TABS tablet Take 1 tablet by mouth daily.    [provider]  rosuvastatin (CRESTOR) 5 MG tablet Take 5 mg by mouth daily. 08/06/21   [provider]  TURMERIC PO Take 1 capsule by mouth 2 (two) times daily.    [provider]    Physical Exam    Vital Signs:  [name] does not have vital signs available for review today.  Given telephonic nature of communication, physical exam is limited. AAOx3. NAD. Normal affect.  Speech and respirations are unlabored.  Accessory Clinical Findings    None  Assessment & Plan    1.  Preoperative Cardiovascular Risk Assessment:  According to the Revised Cardiac Risk Index (RCRI), her Perioperative Risk of Major Cardiac Event is (%): 0.4. Her Functional Capacity in METs is: 5.62 according to the Duke Activity Status Index (DASI). Therefore, based on ACC/AHA guidelines, patient would be at acceptable risk for the planned procedure without further cardiovascular testing.   The patient was advised that if she develops new symptoms prior to surgery to contact our office to arrange for a follow-up visit, and she verbalized understanding.  A copy of this note will be routed to requesting surgeon.  Time:   Today, I have spent 5 minutes with the patient with telehealth technology discussing medical history, symptoms, and management plan.     Joylene Grapes, NP  04/29/2023, 2:27 PM;

## 2023-05-09 DIAGNOSIS — M62838 Other muscle spasm: Secondary | ICD-10-CM | POA: Diagnosis not present

## 2023-05-09 DIAGNOSIS — K4090 Unilateral inguinal hernia, without obstruction or gangrene, not specified as recurrent: Secondary | ICD-10-CM | POA: Diagnosis not present

## 2023-05-09 DIAGNOSIS — M542 Cervicalgia: Secondary | ICD-10-CM | POA: Diagnosis not present

## 2023-05-26 DIAGNOSIS — M19041 Primary osteoarthritis, right hand: Secondary | ICD-10-CM | POA: Diagnosis not present

## 2023-05-26 DIAGNOSIS — M25531 Pain in right wrist: Secondary | ICD-10-CM | POA: Diagnosis not present

## 2023-05-26 DIAGNOSIS — M19031 Primary osteoarthritis, right wrist: Secondary | ICD-10-CM | POA: Diagnosis not present

## 2023-05-26 DIAGNOSIS — M1811 Unilateral primary osteoarthritis of first carpometacarpal joint, right hand: Secondary | ICD-10-CM | POA: Diagnosis not present

## 2023-05-26 DIAGNOSIS — M898X3 Other specified disorders of bone, forearm: Secondary | ICD-10-CM | POA: Diagnosis not present

## 2023-05-26 DIAGNOSIS — M810 Age-related osteoporosis without current pathological fracture: Secondary | ICD-10-CM | POA: Diagnosis not present

## 2023-05-26 DIAGNOSIS — M25431 Effusion, right wrist: Secondary | ICD-10-CM | POA: Diagnosis not present

## 2023-05-29 ENCOUNTER — Ambulatory Visit: Payer: Self-pay | Admitting: Surgery

## 2023-05-29 DIAGNOSIS — L03113 Cellulitis of right upper limb: Secondary | ICD-10-CM | POA: Diagnosis not present

## 2023-05-29 MED ORDER — GENTAMICIN SULFATE 40 MG/ML IJ SOLN: 5.0000 mg/kg | INTRAVENOUS | Status: AC

## 2023-05-29 MED ORDER — DEXTROSE 5 % IV SOLN: 900.0000 mg | INTRAVENOUS | Status: AC

## 2023-05-29 NOTE — Progress Notes (Signed)
COVID Vaccine Completed:  Date of COVID positive in last 90 days:  PCP - Rodrigo Ran, MD Cardiologist - Tonny Bollman, MD  Cardiac clearance in Epic dated 04-29-23 by Bernadene Person, NP  Chest x-ray -  EKG - 10-11-22 Epic Stress Test -  ECHO - 09-25-22 Epic Cardiac Cath -  Pacemaker/ICD device last checked: Spinal Cord Stimulator:  Long Term Monitor - 05-13-22 Epic  Bowel Prep -   Sleep Study -  CPAP -   Fasting Blood Sugar -  Checks Blood Sugar _____ times a day  Last dose of GLP1 agonist-  N/A GLP1 instructions:  N/A   Last dose of SGLT-2 inhibitors-  N/A SGLT-2 instructions: N/A   Blood Thinner Instructions:  Time Aspirin Instructions: Last Dose:  Activity level:  Can go up a flight of stairs and perform activities of daily living without stopping and without symptoms of chest pain or shortness of breath.  Able to exercise without symptoms  Unable to go up a flight of stairs without symptoms of     Anesthesia review:  Moderate Aortic stenosis  Patient denies shortness of breath, fever, cough and chest pain at PAT appointment  Patient verbalized understanding of instructions that were given to them at the PAT appointment. Patient was also instructed that they will need to review over the PAT instructions again at home before surgery.

## 2023-05-29 NOTE — Progress Notes (Signed)
Surgery orders requested via Epic inbox. °

## 2023-05-31 ENCOUNTER — Encounter (HOSPITAL_COMMUNITY): Payer: Self-pay | Admitting: Anesthesiology

## 2023-06-02 ENCOUNTER — Ambulatory Visit (HOSPITAL_COMMUNITY): Admission: RE | Admit: 2023-06-02 | Payer: Medicare Other | Source: Home / Self Care | Admitting: Surgery

## 2023-06-02 ENCOUNTER — Encounter (HOSPITAL_COMMUNITY): Admission: RE | Payer: Self-pay | Source: Home / Self Care

## 2023-06-02 DIAGNOSIS — Z01818 Encounter for other preprocedural examination: Secondary | ICD-10-CM

## 2023-06-02 DIAGNOSIS — I2510 Atherosclerotic heart disease of native coronary artery without angina pectoris: Secondary | ICD-10-CM

## 2023-06-02 SURGERY — LAPAROSCOPIC INGUINAL HERNIA
Anesthesia: General | Laterality: Bilateral

## 2023-06-04 DIAGNOSIS — M25531 Pain in right wrist: Secondary | ICD-10-CM | POA: Diagnosis not present

## 2023-06-04 DIAGNOSIS — M19041 Primary osteoarthritis, right hand: Secondary | ICD-10-CM | POA: Diagnosis not present

## 2023-06-11 DIAGNOSIS — M25531 Pain in right wrist: Secondary | ICD-10-CM | POA: Diagnosis not present

## 2023-06-24 NOTE — Patient Instructions (Addendum)
SURGICAL WAITING ROOM VISITATION Patients having surgery or a procedure may have no more than 2 support people in the waiting area - these visitors may rotate in the visitor waiting room.   Due to an increase in RSV and influenza rates and associated hospitalizations, children ages 81 and under may not visit patients in [facility] hospitals. If the patient needs to stay at the hospital during part of their recovery, the visitor guidelines for inpatient rooms apply.  PRE-OP VISITATION  Pre-op nurse will coordinate an appropriate time for 1 support person to accompany the patient in pre-op.  This support person may not rotate.  This visitor will be contacted when the time is appropriate for the visitor to come back in the pre-op area.  Please refer to the [facility] website for the visitor guidelines for Inpatients (after your surgery is over and you are in a regular room).  You are not required to quarantine at this time prior to your surgery. However, you must do this: Hand Hygiene often Do NOT share personal items Notify your provider if you are in close contact with someone who has COVID or you develop fever 100.4 or greater, new onset of sneezing, cough, sore throat, shortness of breath or body aches.  If you test positive for Covid or have been in contact with anyone that has tested positive in the last 10 days please notify you surgeon.    Your procedure is scheduled on:  Monday  July 07, 2023  Report to [facility] Main Entrance: [place] entrance where the [employer] is available.   Report to admitting at: 07:45     AM  Call this number if you have any questions or problems the morning of surgery [phone]  Do not eat food after Midnight the night prior to your surgery/procedure.  After Midnight you may have the following liquids until   07:00  AM DAY OF SURGERY  Clear Liquid Diet Water Black Coffee (sugar ok, NO MILK/CREAM OR CREAMERS)  Tea (sugar ok, NO  MILK/CREAM OR CREAMERS) regular and decaf                             Plain Jell-O  with no fruit (NO RED)                                           Fruit ices (not with fruit pulp, NO RED)                                     Popsicles (NO RED)                                                                  Juice: NO CITRUS JUICES: only apple, WHITE grape, WHITE cranberry Sports drinks like Gatorade or Powerade (NO RED)                FOLLOW BOWEL PREP AND ANY ADDITIONAL PRE OP INSTRUCTIONS YOU RECEIVED FROM YOUR SURGEON'S OFFICE!!!   Oral Hygiene is  also important to reduce your risk of infection.        Remember - BRUSH YOUR TEETH THE MORNING OF SURGERY WITH YOUR REGULAR TOOTHPASTE  Do NOT smoke after Midnight the night before surgery.  STOP TAKING all Vitamins, Herbs and supplements 1 week before your surgery.   Take ONLY these medicines the morning of surgery with A SIP OF WATER: ezetimibe (Zetia)                     You may not have any metal on your body including hair pins, jewelry, and body piercing  Do not wear make-up, lotions, powders, perfumes or deodorant  Do not wear nail polish including gel and S&S, artificial / acrylic nails, or any other type of covering on natural nails including finger and toenails. If you have artificial nails, gel coating, etc., that needs to be removed by a nail salon, Please have this removed prior to surgery. Not doing so may mean that your surgery could be cancelled or delayed if the Surgeon or anesthesia staff feels like they are unable to monitor you safely.   Do not shave 48 hours prior to surgery to avoid nicks in your skin which may contribute to postoperative infections.    Contacts, Hearing Aids, dentures or bridgework may not be worn into surgery. DENTURES WILL BE REMOVED PRIOR TO SURGERY PLEASE DO NOT APPLY "Poly grip" OR ADHESIVES!!!  You may bring a small overnight bag with you on the day of surgery, only pack items that are not  valuable. [facility] IS NOT RESPONSIBLE   FOR VALUABLES THAT ARE LOST OR STOLEN.   Patients discharged on the day of surgery will not be allowed to drive home.  Someone NEEDS to stay with you for the first 24 hours after anesthesia.  Do not bring your home medications to the hospital. The Pharmacy will dispense medications listed on your medication list to you during your admission in the Hospital.  Special Instructions: Bring a copy of your healthcare power of attorney and living will documents the day of surgery, if you wish to have them scanned into your [place] Medical Records- EPIC  Please read over the following fact sheets you were given: IF YOU HAVE QUESTIONS ABOUT YOUR PRE-OP INSTRUCTIONS, PLEASE CALL [phone]   [facility] - Preparing for Surgery Before surgery, you can play an important role.  Because skin is not sterile, your skin needs to be as free of germs as possible.  You can reduce the number of germs on your skin by washing with CHG (chlorahexidine gluconate) soap before surgery.  CHG is an antiseptic cleaner which kills germs and bonds with the skin to continue killing germs even after washing. Please DO NOT use if you have an allergy to CHG or antibacterial soaps.  If your skin becomes reddened/irritated stop using the CHG and inform your nurse when you arrive at Short Stay. Do not shave (including legs and underarms) for at least 48 hours prior to the first CHG shower.  You may shave your face/neck.  Please follow these instructions carefully:  1.  Shower with CHG Soap the night before surgery and the  morning of surgery.  2.  If you choose to wash your hair, wash your hair first as usual with your normal  shampoo.  3.  After you shampoo, rinse your hair and body thoroughly to remove the shampoo.  4.  Use CHG as you would any other liquid soap.  You can apply chg directly to the skin and wash.  Gently with a scrungie or clean  washcloth.  5.  Apply the CHG Soap to your body ONLY FROM THE NECK DOWN.   Do not use on face/ open                           Wound or open sores. Avoid contact with eyes, ears mouth and genitals (private parts).                       Wash face,  Genitals (private parts) with your normal soap.             6.  Wash thoroughly, paying special attention to the area where your  surgery  will be performed.  7.  Thoroughly rinse your body with warm water from the neck down.  8.  DO NOT shower/wash with your normal soap after using and rinsing off the CHG Soap.            9.  Pat yourself dry with a clean towel.            10.  Wear clean pajamas.            11.  Place clean sheets on your bed the night of your first shower and do not  sleep with pets.  ON THE DAY OF SURGERY : Do not apply any lotions/deodorants the morning of surgery.  Please wear clean clothes to the hospital/surgery center.     FAILURE TO FOLLOW THESE INSTRUCTIONS MAY RESULT IN THE CANCELLATION OF YOUR SURGERY  PATIENT SIGNATURE_________________________________  NURSE SIGNATURE__________________________________  ________________________________________________________________________

## 2023-06-24 NOTE — Progress Notes (Signed)
COVID Vaccine received:  []  No [x]  Yes Date of any COVID positive Test in last 90 days:  none  PCP - Rodrigo Ran, MD Cardiologist - Tonny Bollman, MD  Chest x-ray - 01-27-2021 1v   Epic EKG -  10-11-2022  Epic Stress Test -  ECHO - 09-25-2022  Epic Cardiac Cath -  Zio monitor- 05-13-2022  Epic  PCR screen: []  Ordered & Completed           []   No Order but Needs PROFEND           [x]   N/A for this surgery  Surgery Plan:  [x]  Ambulatory                            []  Outpatient in bed                            []  Admit  Anesthesia:    [x]  General  []  Spinal                           []   Choice []   MAC  Bowel Prep - [x]  No  []   Yes ______  Pacemaker / ICD device [x]  No []  Yes   Spinal Cord Stimulator:[x]  No []  Yes       History of Sleep Apnea? [x]  No []  Yes   CPAP used?- [x]  No []  Yes    Does the patient monitor blood sugar?          []  No []  Yes  [x]  N/A  Patient has: [x]  NO Hx DM   []  Pre-DM                 []  DM1  []   DM2  Blood Thinner / Instructions: none Aspirin Instructions:  none  ERAS Protocol Ordered: []  No  [x]  Yes PRE-SURGERY []  ENSURE  []  G2  [x]  No Drink Ordered Patient is to be NPO after: 07:00  Activity level: Patient is able to climb a flight of stairs without difficulty; [x]  No CP  [x]  No SOB.   Patient can perform ADLs without assistance.   Anesthesia review: Moderate AS,   Patient denies shortness of breath, fever, cough and chest pain at PAT appointment.  Patient verbalized understanding and agreement to the Pre-Surgical Instructions that were given to them at this PAT appointment. Patient was also educated of the need to review these PAT instructions again prior to her surgery.I reviewed the appropriate phone numbers to call if they have any and questions or concerns.

## 2023-06-25 ENCOUNTER — Other Ambulatory Visit: Payer: Self-pay

## 2023-06-25 ENCOUNTER — Encounter (HOSPITAL_COMMUNITY)
Admission: RE | Admit: 2023-06-25 | Discharge: 2023-06-25 | Disposition: A | Discharge status: Home / Self Care | Payer: Medicare Other | Source: Ambulatory Visit | Attending: Surgery | Admitting: Surgery

## 2023-06-25 ENCOUNTER — Encounter (HOSPITAL_COMMUNITY): Payer: Self-pay

## 2023-06-25 VITALS — BP 158/72 | HR 68 | Temp 98.2°F | Resp 16 | Ht 59.0 in | Wt 100.0 lb

## 2023-06-25 DIAGNOSIS — I2510 Atherosclerotic heart disease of native coronary artery without angina pectoris: Secondary | ICD-10-CM | POA: Diagnosis not present

## 2023-06-25 DIAGNOSIS — Z01812 Encounter for preprocedural laboratory examination: Secondary | ICD-10-CM | POA: Diagnosis not present

## 2023-06-25 HISTORY — DX: Cardiac arrhythmia, unspecified: I49.9

## 2023-06-25 HISTORY — DX: Personal history of other diseases of the digestive system: Z87.19

## 2023-06-25 LAB — BASIC METABOLIC PANEL
Anion gap: 9 (ref 5–15)
BUN: 11 mg/dL (ref 8–23)
CO2: 26 mmol/L (ref 22–32)
Calcium: 9.5 mg/dL (ref 8.9–10.3)
Chloride: 105 mmol/L (ref 98–111)
Creatinine, Ser: 0.6 mg/dL (ref 0.44–1.00)
GFR, Estimated: >60 mL/min (ref >60)
Glucose, Bld: 89 mg/dL (ref 70–99)
Potassium: 4 mmol/L (ref 3.5–5.1)
Sodium: 140 mmol/L (ref 135–145)

## 2023-06-25 LAB — CBC
HCT: 40.5 % (ref 36.0–46.0)
Hemoglobin: 13.1 g/dL (ref 12.0–15.0)
MCH: 30.8 pg (ref 26.0–34.0)
MCHC: 32.3 g/dL (ref 30.0–36.0)
MCV: 95.3 fL (ref 80.0–100.0)
Platelets: 207 10*3/uL (ref 150–400)
RBC: 4.25 MIL/uL (ref 3.87–5.11)
RDW: 13.5 % (ref 11.5–15.5)
WBC: 7.2 10*3/uL (ref 4.0–10.5)
nRBC: 0 % (ref 0.0–0.2)

## 2023-07-01 NOTE — Progress Notes (Signed)
Anesthesia Chart Review   Case: [id number] Date/Time: 07/07/23 0945   Procedure: LAPAROSCOPIC BILATERAL INGUINAL HERNIA REPAIR WITH MESH (Bilateral)   Anesthesia type: General   Pre-op diagnosis: BILATERAL INGUINAL HERNIA   [facility] ROOM 02 / [facility]   Surgeons: Quentin Ore, MD       DISCUSSION:89 y.o. former smoker with h/o moderate AS, bilateral inguinal hernias scheduled for above procedure 07/07/2023 with Dr. Ivar Drape.   Per cardiology preoperative evaluation 04/29/2023, "According to the Revised Cardiac Risk Index (RCRI), her Perioperative Risk of Major Cardiac Event is (%): 0.4. Her Functional Capacity in METs is: 5.62 according to the Duke Activity Status Index (DASI). Therefore, based on ACC/AHA guidelines, patient would be at acceptable risk for the planned procedure without further cardiovascular testing."  VS: BP (!) 158/72 Comment: right arm sitting  Pulse 68   Temp 36.8 C (Oral)   Resp 16   Ht 4\' 11"  (1.499 m)   Wt 45.4 kg   SpO2 100%   BMI 20.20 kg/m   PROVIDERS: Rodrigo Ran, MD is PCP   Cardiologist - Tonny Bollman, MD   LABS: Labs reviewed: Acceptable for surgery. (all labs ordered are listed, but only abnormal results are displayed)  Labs Reviewed  BASIC METABOLIC PANEL  CBC     IMAGES:   EKG:   CV: Echo 09/25/2022 1. Left ventricular ejection fraction, by estimation, is 65 to 70%. The  left ventricle has normal function. The left ventricle has no regional  wall motion abnormalities. There is mild asymmetric left ventricular  hypertrophy of the basal-septal segment.  Left ventricular diastolic parameters are indeterminate. The average left  ventricular global longitudinal strain is -21.2 %.   2. Right ventricular systolic function is normal. The right ventricular  size is normal. There is normal pulmonary artery systolic pressure. The  estimated right ventricular systolic pressure is 28.4 mmHg.   3. The mitral valve is  abnormal. Trivial mitral valve regurgitation.  Severe mitral annular calcification.   4. The inferior vena cava is normal in size with greater than 50%  respiratory variability, suggesting right atrial pressure of 3 mmHg.   5. The aortic valve is calcified. There is moderate calcification of the  aortic valve. Aortic valve regurgitation is mild. Moderate aortic valve  stenosis. Vmax 2.8 m/s, MG , AVA 0.9 cm^2, DI 0.37  Past Medical History:  Diagnosis Date   Arthritis    Cataract    Dysrhythmia    palpitations   History of hiatal hernia    Hx of colonic polyps    Hypercholesterolemia    Lumbar back pain    Neck pain    Osteoporosis    Swelling of ankle 01/12/2016   bilateral. Pt is taking lasix x 2 weeks and wearing compression stockings    Past Surgical History:  Procedure Laterality Date   ABDOMINAL HYSTERECTOMY  12/2003   DR. Neal--bso, A/P repair   cataract surgery Bilateral    COLONOSCOPY     HIP ARTHROPLASTY Left 01/28/2021   Procedure: ARTHROPLASTY BIPOLAR HIP (HEMIARTHROPLASTY);  Surgeon: Eldred Manges, MD;  Location: [facility];  Service: Orthopedics;  Laterality: Left;  depuy, Lateral position with Mark II   LUMBAR LAMINECTOMY  1985   Dr. Darrelyn Hillock   POLYPECTOMY     surgery for removal of rectal polyp  07/1995   Dr. Cindee Lame   TUBAL LIGATION      MEDICATIONS:  Ascorbic Acid (VITAMIN C PO)   BD PEN NEEDLE NANO  2ND GEN 32G X 4 MM MISC   Calcium Carbonate-Vitamin D (CALCIUM-D PO)   Cholecalciferol (VITAMIN D3 PO)   denosumab (PROLIA) 60 MG/ML SOSY injection   docusate sodium (COLACE) 100 MG capsule   ELDERBERRY-VITAMIN C-ZINC PO   ezetimibe (ZETIA) 10 MG tablet   methocarbamol (ROBAXIN) 500 MG tablet   Probiotic Product (ALIGN) CHEW   rosuvastatin (CRESTOR) 5 MG tablet   No current facility-administered medications for this encounter.    Jodell Cipro Ward, PA-C [facility] Pre-Surgical Testing [phone]

## 2023-07-01 NOTE — Anesthesia Preprocedure Evaluation (Addendum)
Anesthesia Evaluation  Patient identified by MRN, date of birth, ID band Patient awake    Reviewed: Allergy & Precautions, NPO status , Patient's Chart, lab work & pertinent test results  Airway Mallampati: II  TM Distance: >3 FB Neck ROM: Full    Dental no notable dental hx.    Pulmonary former smoker   Pulmonary exam normal        Cardiovascular + dysrhythmias + Valvular Problems/Murmurs AS  Rhythm:Regular Rate:Normal  Echo 09/25/2022 1. Left ventricular ejection fraction, by estimation, is 65 to 70%. The  left ventricle has normal function. The left ventricle has no regional  wall motion abnormalities. There is mild asymmetric left ventricular  hypertrophy of the basal-septal segment.  Left ventricular diastolic parameters are indeterminate. The average left  ventricular global longitudinal strain is -21.2 %.   2. Right ventricular systolic function is normal. The right ventricular  size is normal. There is normal pulmonary artery systolic pressure. The  estimated right ventricular systolic pressure is 28.4 mmHg.   3. The mitral valve is abnormal. Trivial mitral valve regurgitation.  Severe mitral annular calcification.   4. The inferior vena cava is normal in size with greater than 50%  respiratory variability, suggesting right atrial pressure of 3 mmHg.   5. The aortic valve is calcified. There is moderate calcification of the  aortic valve. Aortic valve regurgitation is mild. Moderate aortic valve  stenosis. Vmax 2.8 m/s, MG , AVA 0.9 cm^2, DI 0.37     Neuro/Psych negative neurological ROS  negative psych ROS   GI/Hepatic Neg liver ROS, hiatal hernia,,,  Endo/Other  negative endocrine ROS    Renal/GU negative Renal ROS  negative genitourinary   Musculoskeletal  (+) Arthritis , Osteoarthritis,    Abdominal Normal abdominal exam  (+)   Peds  Hematology  (+) Blood dyscrasia, anemia   Anesthesia Other  Findings   Reproductive/Obstetrics                             Anesthesia Physical Anesthesia Plan  ASA: 3  Anesthesia Plan: General   Post-op Pain Management: Celebrex PO (pre-op)* and Tylenol PO (pre-op)*   Induction: Intravenous  PONV Risk Score and Plan: 3 and Ondansetron, Dexamethasone and Treatment may vary due to age or medical condition  Airway Management Planned: Mask and Oral ETT  Additional Equipment: None  Intra-op Plan:   Post-operative Plan: Extubation in OR  Informed Consent: I have reviewed the patients History and Physical, chart, labs and discussed the procedure including the risks, benefits and alternatives for the proposed anesthesia with the patient or authorized representative who has indicated his/her understanding and acceptance.     Dental advisory given  Plan Discussed with: CRNA  Anesthesia Plan Comments: (See PAT note 06/25/2023)       Anesthesia Quick Evaluation

## 2023-07-07 ENCOUNTER — Other Ambulatory Visit: Payer: Self-pay

## 2023-07-07 ENCOUNTER — Ambulatory Visit (HOSPITAL_COMMUNITY): Payer: Medicare Other | Admitting: Physician Assistant

## 2023-07-07 ENCOUNTER — Ambulatory Visit (HOSPITAL_BASED_OUTPATIENT_CLINIC_OR_DEPARTMENT_OTHER): Payer: Medicare Other | Admitting: Registered Nurse

## 2023-07-07 ENCOUNTER — Encounter (HOSPITAL_COMMUNITY): Payer: Self-pay | Admitting: Surgery

## 2023-07-07 ENCOUNTER — Ambulatory Visit (HOSPITAL_COMMUNITY)
Admission: RE | Admit: 2023-07-07 | Discharge: 2023-07-07 | Disposition: A | Discharge status: Home / Self Care | Payer: Medicare Other | Attending: Surgery | Admitting: Surgery

## 2023-07-07 ENCOUNTER — Encounter (HOSPITAL_COMMUNITY)
Admission: RE | Disposition: A | Discharge status: Home / Self Care | Payer: Self-pay | Source: Home / Self Care | Attending: Surgery

## 2023-07-07 DIAGNOSIS — I080 Rheumatic disorders of both mitral and aortic valves: Secondary | ICD-10-CM | POA: Diagnosis not present

## 2023-07-07 DIAGNOSIS — E7800 Pure hypercholesterolemia, unspecified: Secondary | ICD-10-CM | POA: Diagnosis not present

## 2023-07-07 DIAGNOSIS — K4020 Bilateral inguinal hernia, without obstruction or gangrene, not specified as recurrent: Secondary | ICD-10-CM

## 2023-07-07 DIAGNOSIS — I700 Atherosclerosis of aorta: Secondary | ICD-10-CM | POA: Diagnosis not present

## 2023-07-07 DIAGNOSIS — I350 Nonrheumatic aortic (valve) stenosis: Secondary | ICD-10-CM | POA: Diagnosis not present

## 2023-07-07 DIAGNOSIS — Z87891 Personal history of nicotine dependence: Secondary | ICD-10-CM | POA: Diagnosis not present

## 2023-07-07 DIAGNOSIS — E785 Hyperlipidemia, unspecified: Secondary | ICD-10-CM | POA: Diagnosis not present

## 2023-07-07 DIAGNOSIS — M1990 Unspecified osteoarthritis, unspecified site: Secondary | ICD-10-CM | POA: Diagnosis not present

## 2023-07-07 HISTORY — PX: INGUINAL HERNIA REPAIR: SHX194

## 2023-07-07 SURGERY — REPAIR, HERNIA, INGUINAL, LAPAROSCOPIC
Anesthesia: General | Laterality: Bilateral

## 2023-07-07 MED ORDER — FENTANYL CITRATE PF 50 MCG/ML IJ SOSY: 25.0000 ug | PREFILLED_SYRINGE | INTRAMUSCULAR | Status: DC | PRN

## 2023-07-07 MED ORDER — CEFAZOLIN SODIUM-DEXTROSE 2-4 GM/100ML-% IV SOLN
2.0000 g | Freq: Once | INTRAVENOUS | Status: AC
  Administered 2023-07-07: 2 g via INTRAVENOUS
  Filled 2023-07-07: qty 100

## 2023-07-07 MED ORDER — 0.9 % SODIUM CHLORIDE (POUR BTL) OPTIME
TOPICAL | Status: DC | PRN
  Administered 2023-07-07: 1000 mL

## 2023-07-07 MED ORDER — BUPIVACAINE LIPOSOME 1.3 % IJ SUSP
INTRAMUSCULAR | Status: DC | PRN
  Administered 2023-07-07: 20 mL

## 2023-07-07 MED ORDER — HEPARIN SODIUM (PORCINE) 5000 UNIT/ML IJ SOLN
5000.0000 [IU] | Freq: Once | INTRAMUSCULAR | Status: AC
  Administered 2023-07-07: 5000 [IU] via SUBCUTANEOUS
  Filled 2023-07-07: qty 1

## 2023-07-07 MED ORDER — ONDANSETRON HCL 4 MG/2ML IJ SOLN
INTRAMUSCULAR | Status: AC
  Filled 2023-07-07: qty 2

## 2023-07-07 MED ORDER — LIDOCAINE HCL (PF) 2 % IJ SOLN
INTRAMUSCULAR | Status: AC
  Filled 2023-07-07: qty 5

## 2023-07-07 MED ORDER — ROCURONIUM BROMIDE 10 MG/ML (PF) SYRINGE
PREFILLED_SYRINGE | INTRAVENOUS | Status: AC
  Filled 2023-07-07: qty 10

## 2023-07-07 MED ORDER — TRAMADOL HCL 50 MG PO TABS: 50.0000 mg | ORAL_TABLET | Freq: Four times a day (QID) | ORAL | 0 refills | Status: DC | PRN

## 2023-07-07 MED ORDER — GABAPENTIN 300 MG PO CAPS
300.0000 mg | ORAL_CAPSULE | ORAL | Status: AC
  Administered 2023-07-07: 300 mg via ORAL
  Filled 2023-07-07: qty 1

## 2023-07-07 MED ORDER — CHLORHEXIDINE GLUCONATE CLOTH 2 % EX PADS: 6.0000 | MEDICATED_PAD | Freq: Once | CUTANEOUS | Status: DC

## 2023-07-07 MED ORDER — BUPIVACAINE-EPINEPHRINE 0.25% -1:200000 IJ SOLN
INTRAMUSCULAR | Status: AC
  Filled 2023-07-07: qty 1

## 2023-07-07 MED ORDER — CELECOXIB 200 MG PO CAPS
200.0000 mg | ORAL_CAPSULE | Freq: Once | ORAL | Status: AC
  Administered 2023-07-07: 200 mg via ORAL
  Filled 2023-07-07: qty 1

## 2023-07-07 MED ORDER — BUPIVACAINE-EPINEPHRINE (PF) 0.25% -1:200000 IJ SOLN
INTRAMUSCULAR | Status: DC | PRN
  Administered 2023-07-07: 30 mL

## 2023-07-07 MED ORDER — DEXAMETHASONE SODIUM PHOSPHATE 10 MG/ML IJ SOLN
INTRAMUSCULAR | Status: DC | PRN
  Administered 2023-07-07: 8 mg via INTRAVENOUS

## 2023-07-07 MED ORDER — ARTIFICIAL TEARS OPHTHALMIC OINT
TOPICAL_OINTMENT | OPHTHALMIC | Status: AC
  Filled 2023-07-07: qty 3.5

## 2023-07-07 MED ORDER — SUGAMMADEX SODIUM 200 MG/2ML IV SOLN
INTRAVENOUS | Status: DC | PRN
  Administered 2023-07-07: 200 mg via INTRAVENOUS

## 2023-07-07 MED ORDER — FENTANYL CITRATE (PF) 100 MCG/2ML IJ SOLN
INTRAMUSCULAR | Status: DC | PRN
  Administered 2023-07-07 (×2): 25 ug via INTRAVENOUS
  Administered 2023-07-07: 50 ug via INTRAVENOUS

## 2023-07-07 MED ORDER — ACETAMINOPHEN 500 MG PO TABS
1000.0000 mg | ORAL_TABLET | ORAL | Status: AC
  Administered 2023-07-07: 1000 mg via ORAL
  Filled 2023-07-07: qty 2

## 2023-07-07 MED ORDER — ACETAMINOPHEN 500 MG PO TABS: 1000.0000 mg | ORAL_TABLET | Freq: Once | ORAL | Status: DC

## 2023-07-07 MED ORDER — PROPOFOL 10 MG/ML IV BOLUS
INTRAVENOUS | Status: DC | PRN
  Administered 2023-07-07: 100 mg via INTRAVENOUS

## 2023-07-07 MED ORDER — BUPIVACAINE LIPOSOME 1.3 % IJ SUSP
INTRAMUSCULAR | Status: AC
  Filled 2023-07-07: qty 20

## 2023-07-07 MED ORDER — CHLORHEXIDINE GLUCONATE 0.12 % MT SOLN
15.0000 mL | Freq: Once | OROMUCOSAL | Status: AC
  Administered 2023-07-07: 15 mL via OROMUCOSAL

## 2023-07-07 MED ORDER — DEXAMETHASONE SODIUM PHOSPHATE 10 MG/ML IJ SOLN
INTRAMUSCULAR | Status: AC
  Filled 2023-07-07: qty 1

## 2023-07-07 MED ORDER — LIDOCAINE 2% (20 MG/ML) 5 ML SYRINGE
INTRAMUSCULAR | Status: DC | PRN
  Administered 2023-07-07: 40 mg via INTRAVENOUS

## 2023-07-07 MED ORDER — ROCURONIUM BROMIDE 10 MG/ML (PF) SYRINGE
PREFILLED_SYRINGE | INTRAVENOUS | Status: DC | PRN
  Administered 2023-07-07: 30 mg via INTRAVENOUS

## 2023-07-07 MED ORDER — BUPIVACAINE LIPOSOME 1.3 % IJ SUSP: 20.0000 mL | Freq: Once | INTRAMUSCULAR | Status: DC

## 2023-07-07 MED ORDER — LACTATED RINGERS IV SOLN: INTRAVENOUS | Status: DC

## 2023-07-07 MED ORDER — ONDANSETRON HCL 4 MG/2ML IJ SOLN
INTRAMUSCULAR | Status: DC | PRN
  Administered 2023-07-07: 4 mg via INTRAVENOUS

## 2023-07-07 MED ORDER — FENTANYL CITRATE (PF) 100 MCG/2ML IJ SOLN
INTRAMUSCULAR | Status: AC
  Filled 2023-07-07: qty 2

## 2023-07-07 SURGICAL SUPPLY — 38 items
ADH SKN CLS APL DERMABOND .7 (GAUZE/BANDAGES/DRESSINGS) ×1
APL PRP STRL LF DISP 70% ISPRP (MISCELLANEOUS) ×1
BAG COUNTER SPONGE SURGICOUNT (BAG) ×1 IMPLANT
BAG SPNG CNTER NS LX DISP (BAG) ×1
CABLE HIGH FREQUENCY MONO STRZ (ELECTRODE) ×1 IMPLANT
CHLORAPREP W/TINT 26 (MISCELLANEOUS) ×1 IMPLANT
DERMABOND ADVANCED .7 DNX12 (GAUZE/BANDAGES/DRESSINGS) ×1 IMPLANT
ELECT REM PT RETURN 15FT ADLT (MISCELLANEOUS) ×1 IMPLANT
GLOVE BIO SURGEON STRL SZ7.5 (GLOVE) ×1 IMPLANT
GLOVE INDICATOR 8.0 STRL GRN (GLOVE) ×1 IMPLANT
GOWN STRL REUS W/ TWL XL LVL3 (GOWN DISPOSABLE) ×2 IMPLANT
GOWN STRL REUS W/TWL XL LVL3 (GOWN DISPOSABLE) ×2
GRASPER SUT TROCAR 14GX15 (MISCELLANEOUS) ×1 IMPLANT
IRRIG SUCT STRYKERFLOW 2 WTIP (MISCELLANEOUS)
IRRIGATION SUCT STRKRFLW 2 WTP (MISCELLANEOUS) IMPLANT
KIT BASIN OR (CUSTOM PROCEDURE TRAY) ×1 IMPLANT
KIT TURNOVER KIT A (KITS) IMPLANT
MARKER SKIN DUAL TIP RULER LAB (MISCELLANEOUS) ×1 IMPLANT
MESH 3DMAX 4X6 LT LRG (Mesh General) IMPLANT
MESH 3DMAX 4X6 RT LRG (Mesh General) IMPLANT
NDL INSUFFLATION 14GA 120MM (NEEDLE) ×2 IMPLANT
NEEDLE INSUFFLATION 14GA 120MM (NEEDLE) ×2
RELOAD STAPLE 4.0 BLU F/HERNIA (INSTRUMENTS) IMPLANT
RELOAD STAPLE 4.8 BLK F/HERNIA (STAPLE) ×1 IMPLANT
RELOAD STAPLE HERNIA 4.0 BLUE (INSTRUMENTS)
RELOAD STAPLE HERNIA 4.8 BLK (STAPLE) ×2
SCISSORS LAP 5X35 DISP (ENDOMECHANICALS) ×1 IMPLANT
SET TUBE SMOKE EVAC HIGH FLOW (TUBING) ×1 IMPLANT
SPIKE FLUID TRANSFER (MISCELLANEOUS) IMPLANT
STAPLER HERNIA 12 8.5 360D (INSTRUMENTS) ×1 IMPLANT
SUT MNCRL AB 4-0 PS2 18 (SUTURE) ×1 IMPLANT
SUT VICRYL 0 UR6 27IN ABS (SUTURE) IMPLANT
TOWEL OR 17X26 10 PK STRL BLUE (TOWEL DISPOSABLE) ×1 IMPLANT
TRAY FOL W/BAG SLVR 16FR STRL (SET/KITS/TRAYS/PACK) ×1 IMPLANT
TRAY FOLEY W/BAG SLVR 16FR LF (SET/KITS/TRAYS/PACK) ×1
TRAY LAPAROSCOPIC (CUSTOM PROCEDURE TRAY) ×1 IMPLANT
TROCAR ADV FIXATION 12X100MM (TROCAR) ×1 IMPLANT
TROCAR Z-THREAD OPTICAL 5X100M (TROCAR) ×2 IMPLANT

## 2023-07-07 NOTE — Op Note (Signed)
Patient: [name] (1933-07-09, [id number])  Date of Surgery: 07/07/2023   Preoperative Diagnosis: Bilateral Inuginal Hernia  Postoperative Diagnosis: BILATERAL INGUINAL HERNIA   Surgical Procedure: LAPAROSCOPIC BILATERAL INGUINAL HERNIA REPAIR WITH MESH: [id number]   Operative Team Members:  Surgeons and Role:    * Tori Cupps, Hyman Hopes, MD - Primary    * Maczis, Hedda Slade, [place]   Anesthesiologist: Nance Pew Nelle Don, DO CRNA: Elisabeth Cara, CRNA; Orest Dikes, CRNA   Anesthesia: General   Fluids:  Total I/O In: 100 [IV Piggyback:100] Out: -   Complications: None  Drains:  None  Specimen: None  Disposition:  PACU - hemodynamically stable.  Plan of Care: Discharge to home after PACU  Indications for Procedure: [name] is a 89 y.o. female who presented with bilateral inguinal hernias.  I recommended laparoscopic repair.  We discussed the procedure, its risks, benefits and alternatives.  After a full discussion the patient granted consent to proceed.  We will proceed as scheduled.  Findings:  Technique: Transabdominal preperitoneal (TAPP) Hernia Location: Bilateral indirect inguinal hernias, right side containing small intestine Mesh Size &Type:  Bard Large 3D max mesh right and left sided Mesh Fixation: Endo-Universal hernia stapler  Infection status: Patient: Private Patient Elective Case Case: Elective Infection Present At Time Of Surgery (PATOS): None  Description of Procedure:  The patient was positioned supine, padded and secured to the bed, with both arms tucked.  The abdomen was widely prepped and draped.  A time out procedure was performed.  A 1 cm infraumbilical incision was made.  The abdomen was entered utilizing a Veress needle at the umbilical stalk, using a kocher clamp to elevate the umbilical stalk.  The abdomen was insufflated to 15 mm of Hg.  A blunt 12 mm trocar was inserted using the optical techinque.  Additional 5 mm  trocars were placed in the left and right abdomen.  There was an indirect hernia on the RIGHT.  Small bowel was reduced from the hernia sac.  Utilizing a transabdominal pre peritoneal technique (TAPP), a horizontal incision was made in the peritoneum, immediately below the umbilicus.  Dissection was carried out in the pre peritoneal space down to the level of the hernia sac which was reduced into the peritoneal cavity completely.  The round ligament was identified and divided utilizing cautery.  A large pre peritoneal dissection was performed to uncover the direct, indirect, femoral and obturator spaces.  Cooper's ligament was uncovered medially and the psoas muscle uncovered laterally.  The mesh, as documented above, was opened and advanced into the pre peritoneal position so that it more than adequately covered the indirect, direct, femoral and obturator spaces.  The mesh laid flat, with no inferior folds and covered the entire myopectineal orifice.  The mesh was fixated with the endo-universal hernia stapler to Cooper's ligament and the posterior aspect of the rectus muscle.  The peritoneal flap was closed with the same device.  There were no peritoneal defects or exposed mesh at the conclusion.  There was an indirect hernia on the LEFT.  Utilizing a transabdominal pre peritoneal technique (TAPP), a horizontal incision was made in the peritoneum, immediately below the umbilicus.  Dissection was carried out in the pre peritoneal space down to the level of the hernia sac which was reduced into the peritoneal cavity completely.  The round ligament was identified and divided utilizing cautery.  A large pre peritoneal dissection was performed to uncover the direct, indirect, femoral and obturator spaces.  Cooper's ligament was uncovered medially and the psoas muscle uncovered laterally.  The mesh, as documented above, was opened and advanced into the pre peritoneal position so that it more than adequately  covered the indirect, direct, femoral and obturator spaces.  The mesh laid flat, with no inferior folds and covered the entire myopectineal orifice.  The mesh was fixated with the endo-universal hernia stapler to Cooper's ligament and the posterior aspect of the rectus muscle.  The peritoneal flap was closed with the same device.  There were no peritoneal defects or exposed mesh at the conclusion.  The umbilical trocar was removed and the fascial defect was closed with a figure of eight 0 Vicryl suture, utilizing a suture passer.  The peritoneal cavity was completely desufflated, the trocars removed and the skin closed with 4-0 Monocryl subcuticular suture and skin glue.  All sponge and needle counts were correct at the end of the case.  Ivar Drape, MD General, Bariatric, & Minimally Invasive Surgery [facility], [place]

## 2023-07-07 NOTE — Anesthesia Procedure Notes (Signed)
Procedure Name: Intubation Date/Time: 07/07/2023 9:38 AM  Performed by: Elisabeth Cara, CRNAPre-anesthesia Checklist: Patient identified, Emergency Drugs available, Suction available, Patient being monitored and Timeout performed Patient Re-evaluated:Patient Re-evaluated prior to induction Oxygen Delivery Method: Circle system utilized Preoxygenation: Pre-oxygenation with 100% oxygen Induction Type: IV induction Ventilation: Mask ventilation without difficulty Laryngoscope Size: Mac and 4 Grade View: Grade I Tube type: Oral Tube size: 7.0 mm Number of attempts: 1 Airway Equipment and Method: Stylet Placement Confirmation: ETT inserted through vocal cords under direct vision, positive ETCO2 and breath sounds checked- equal and bilateral Secured at: 21 cm Tube secured with: Tape Dental Injury: Teeth and Oropharynx as per pre-operative assessment

## 2023-07-07 NOTE — Discharge Instructions (Signed)
 GROIN HERNIA REPAIR POST OPERATIVE INSTRUCTIONS  Thinking Clearly  The anesthesia may cause you to feel different for 1 or 2 days. Do not drive, drink alcohol, or make any big decisions for at least 2 days.  Nutrition When you wake up, you will be able to drink small amounts of liquid. If you do not feel sick, you can slowly advance your diet to regular foods. Continue to drink lots of fluids, usually about 8 to 10 glasses per day. Eat a high-fiber diet so you don't strain during bowel movements. High-Fiber Foods Foods high in fiber include beans, bran cereals and whole-grain breads, peas, dried fruit (figs, apricots, and dates), raspberries, blackberries, strawberries, sweet corn, broccoli, baked potatoes with skin, plums, pears, apples, greens, and nuts. Activity Slowly increase your activity. Be sure to get up and walk every hour or so to prevent blood clots. No heavy lifting or strenuous activity for 4 weeks following surgery to prevent hernias at your incision sites or recurrence of your hernia. It is normal to feel tired. You may need more sleep than usual.  Get your rest but make sure to get up and move around frequently to prevent blood clots and pneumonia.  Work and Return to School You can go back to work when you feel well enough. Discuss the timing with your surgeon. You can usually go back to school or work 1 week or less after an laparoscopic or an open repair. If your work requires heavy lifting or strenuous activity you need to be placed on light duty for 4 weeks following surgery. You can return to gym class, sports or other physical activities 4 weeks after surgery.  Wound Care You may experience significant bruising in the groin including into the scrotum in males.  Rest, elevating the groin and scrotum above the level of the heart, ice and compression with tight fitting underwear can help.  Always wash your hands before and after touching near your incision site. Do  not soak in a bathtub until cleared at your follow up appointment. You may take a shower 24 hours after surgery. A small amount of drainage from the incision is normal. If the drainage is thick and yellow or the site is red, you may have an infection, so call your surgeon. If you have a drain in one of your incisions, it will be taken out in office when the drainage stops. Steri-Strips will fall off in 7 to 10 days or they will be removed during your first office visit. If you have dermabond glue covering over the incision, allow the glue to flake off on its own. Protect the new skin, especially from the sun. The sun can burn and cause darker scarring. Your scar will heal in about 4 to 6 weeks and will become softer and continue to fade over the next year.  The cosmetic appearance of the incisions will improve over the course of the first year after surgery. Sensation around your incision will return in a few weeks or months.  Bowel Movements After intestinal surgery, you may have loose watery stools for several days. If watery diarrhea lasts longer than 3 days, contact your surgeon. Pain medication (narcotics) can cause constipation. Increase the fiber in your diet with high-fiber foods if you are constipated. You can take an over the counter stool softener like Colace to avoid constipation.  Additional over the counter medications can also be used if Colace isn't sufficient (for example, Milk of Magnesia or Miralax).    Pain The amount of pain is different for each person. Some people need only 1 to 3 doses of pain control medication, while others need more. Take alternating doses of tylenol and ibuprofen around the clock for the first five days following surgery.  This will provide a baseline of pain control and help with inflammation.  Take the narcotic pain medication in addition if needed for severe pain.  Contact Your Surgeon at [phone], if you have: Pain that will not go away Pain that  gets worse A fever of more than 101F (38.3C) Repeated vomiting Swelling, redness, bleeding, or bad-smelling drainage from your wound site Strong abdominal pain No bowel movement or unable to pass gas for 3 days Watery diarrhea lasting longer than 3 days  Pain Control The goal of pain control is to minimize pain, keep you moving and help you heal. Your surgical team will work with you on your pain plan. Most often a combination of therapies and medications are used to control your pain. You may also be given medication (local anesthetic) at the surgical site. This may help control your pain for several days. Extreme pain puts extra stress on your body at a time when your body needs to focus on healing. Do not wait until your pain has reached a level "10" or is unbearable before telling your doctor or nurse. It is much easier to control pain before it becomes severe. Following a laparoscopic procedure, pain is sometimes felt in the shoulder. This is due to the gas inserted into your abdomen during the procedure. Moving and walking helps to decrease the gas and the right shoulder pain.  Use the guide below for ways to manage your post-operative pain. Learn more by going to facs.org/safepaincontrol.  How Intense Is My Pain Common Therapies to Feel Better       I hardly notice my pain, and it does not interfere with my activities.  I notice my pain and it distracts me, but I can still do activities (sitting up, walking, standing).  Non-Medication Therapies  Ice (in a bag, applied over clothing at the surgical site), elevation, rest, meditation, massage, distraction (music, TV, play) walking and mild exercise Splinting the abdomen with pillows +  Non-Opioid Medications Acetaminophen (Tylenol) Non-steroidal anti-inflammatory drugs (NSAIDS) Aspirin, Ibuprofen (Motrin, Advil) Naproxen (Aleve) Take these as needed, when you feel pain. Both acetaminophen and NSAIDs help to decrease pain  and swelling (inflammation).      My pain is hard to ignore and is more noticeable even when I rest.  My pain interferes with my usual activities.  Non-Medication Therapies  +  Non-Opioid medications  Take on a regular schedule (around-the-clock) instead of as needed. (For example, Tylenol every 6 hours at 9:00 am, 3:00 pm, 9:00 pm, 3:00 am and Motrin every 6 hours at 12:00 am, 6:00 am, 12:00 pm, 6:00 pm)         I am focused on my pain, and I am not doing my daily activities.  I am groaning in pain, and I cannot sleep. I am unable to do anything.  My pain is as bad as it could be, and nothing else matters.  Non-Medication Therapies  +  Around-the-Clock Non-Opioid Medications  +  Short-acting opioids  Opioids should be used with other medications to manage severe pain. Opioids block pain and give a feeling of euphoria (feel high). Addiction, a serious side effect of opioids, is rare with short-term (a few days) use.  Examples of short-acting opioids   include: Tramadol (Ultram), Hydrocodone (Norco, Vicodin), Hydromorphone (Dilaudid), Oxycodone (Oxycontin)     The above directions have been adapted from the American College of [employer]geons Surgical Patient Education Program.  Please refer to the ACS website if needed: [email or web address]/-/media/[email or web address]   Paul Stechschulte, MD [facility] [address], [address], [place], [place]  [address] ?  [address] [address], [place], [place]   [address] [phone] ? [phone] ? FAX [phone] Web site: [email or web address]  

## 2023-07-07 NOTE — H&P (Signed)
Admitting Physician: Hyman Hopes Joclyn Alsobrook  Service: General Surgery  CC: Bilateral inguinal hernias  Subjective   HPI: [name] is an 89 y.o. female who is here for bilateral inguinal hernia repair.  Past Medical History:  Diagnosis Date   Arthritis    Cataract    Dysrhythmia    palpitations   History of hiatal hernia    Hx of colonic polyps    Hypercholesterolemia    Lumbar back pain    Neck pain    Osteoporosis    Swelling of ankle 01/12/2016   bilateral. Pt is taking lasix x 2 weeks and wearing compression stockings    Past Surgical History:  Procedure Laterality Date   ABDOMINAL HYSTERECTOMY  12/2003   DR. Neal--bso, A/P repair   cataract surgery Bilateral    COLONOSCOPY     HIP ARTHROPLASTY Left 01/28/2021   Procedure: ARTHROPLASTY BIPOLAR HIP (HEMIARTHROPLASTY);  Surgeon: Eldred Manges, MD;  Location: [facility];  Service: Orthopedics;  Laterality: Left;  depuy, Lateral position with Mark II   LUMBAR LAMINECTOMY  1985   Dr. Darrelyn Hillock   POLYPECTOMY     surgery for removal of rectal polyp  07/1995   Dr. Cindee Lame   TUBAL LIGATION      Family History  Problem Relation Age of Onset   Stroke Mother    Heart Problems Sister        triple bipass   Heart Problems Brother        blocked arteries, stent.   Stomach cancer Neg Hx    Colon cancer Neg Hx    Pancreatic cancer Neg Hx    Rectal cancer Neg Hx    Colon polyps Neg Hx    Esophageal cancer Neg Hx     Social:  reports that she quit smoking about 68 years ago. Her smoking use included cigarettes. She started smoking about 69 years ago. She has a 0.3 pack-year smoking history. She has never been exposed to tobacco smoke. She has never used smokeless tobacco. She reports that she does not currently use alcohol. She reports that she does not use drugs.  Allergies:  Allergies  Allergen Reactions   Risedronate Sodium Other (See Comments)    Reaction to Actonel (unknown reaction)   Cephalexin Other (See  Comments)    Upset stomach and made very tired   Oxycodone Other (See Comments)    Unknown reaction.   Hydrocodone Nausea Only    Medications: Current Outpatient Medications  Medication Instructions   Ascorbic Acid (VITAMIN C PO) 1 tablet, Oral, 2 times daily, Gummies   BD PEN NEEDLE NANO 2ND GEN 32G X 4 MM MISC SMARTSIG:Pre-Filled Pen Syringe SUB-Q Daily   Calcium Carbonate-Vitamin D (CALCIUM-D PO) 1 tablet, Oral, 2 times daily   Cholecalciferol (VITAMIN D3 PO) 1 tablet, Oral, 2 times daily   docusate sodium (COLACE) 100 mg, Oral, 2 times daily   ELDERBERRY-VITAMIN C-ZINC PO 1 tablet, Oral, 2 times daily, Gummies   ezetimibe (ZETIA) 10 mg, Oral, Daily   methocarbamol (ROBAXIN) 500 mg, Oral, Every 8 hours PRN   Probiotic Product (ALIGN) CHEW 1 tablet, Oral, 2 times daily, Gummies   Prolia 60 mg, Subcutaneous, Every 6 months   rosuvastatin (CRESTOR) 5 mg, Oral, Daily    ROS - all of the below systems have been reviewed with the patient and positives are indicated with bold text General: chills, fever or night sweats Eyes: blurry vision or double vision ENT: epistaxis or sore throat  Allergy/Immunology: itchy/watery eyes or nasal congestion Hematologic/Lymphatic: bleeding problems, blood clots or swollen lymph nodes Endocrine: temperature intolerance or unexpected weight changes Breast: new or changing breast lumps or nipple discharge Resp: cough, shortness of breath, or wheezing CV: chest pain or dyspnea on exertion GI: as per HPI GU: dysuria, trouble voiding, or hematuria MSK: joint pain or joint stiffness Neuro: TIA or stroke symptoms Derm: pruritus and skin lesion changes Psych: anxiety and depression  Objective   PE Blood pressure (!) 164/73, pulse 73, temperature 97.7 F (36.5 C), temperature source Oral, resp. rate 16, height 4\' 11"  (1.499 m), weight 45.4 kg, SpO2 98%. Constitutional: NAD; conversant; no deformities Eyes: Moist conjunctiva; no lid lag; anicteric;  PERRL Neck: Trachea midline; no thyromegaly Lungs: Normal respiratory effort; no tactile fremitus CV: RRR; no palpable thrills; no pitting edema GI: Abd Soft, nontender, bilateral inguinal hernias; no palpable hepatosplenomegaly MSK: Normal range of motion of extremities; no clubbing/cyanosis Psychiatric: Appropriate affect; alert and oriented x3 Lymphatic: No palpable cervical or axillary lymphadenopathy  No results found for this or any previous visit (from the past 24 hour(s)).  Imaging Orders  No imaging studies ordered today  CT Abd/Pel 04/19/23 1. Complex right groin hernia with hernia sac measuring approximately 3.5 x 2.2 x 5.9 cm. There appears to be some small bowel retracted down into the proximal portion of the hernia sac without evidence for obstruction or bowel wall thickening. The more distal/inferior aspect of the hernia sac is filled with fat and fluid. Given the fluid in the hernia sac, a component of incarceration is not excluded. 2. Small left groin hernia contains only fat. 3. Tiny periumbilical midline ventral hernia contains only fat. 4. Moderate hiatal hernia. 5. Pelvic floor laxity with cystocele. 6.  Aortic Atherosclerosis (ICD10-I70.0).    Assessment and Plan   [name] is an 89 y.o. female with bilateral inguinal hernias.  I recommended laparoscopic bilateral inguinal hernia repair with mesh.  We discussed the procedure, its risks, benefits and alternatives.  After a full discussion and all questions answered the patient granted consent to proceed.    Quentin Ore, MD  [facility] Use [email or web address] to contact on call provider

## 2023-07-07 NOTE — Transfer of Care (Signed)
Immediate Anesthesia Transfer of Care Note  Patient: [name]  Procedure(s) Performed: LAPAROSCOPIC BILATERAL INGUINAL HERNIA REPAIR WITH MESH (Bilateral)  Patient Location: PACU  Anesthesia Type:General  Level of Consciousness: awake and alert   Airway & Oxygen Therapy: Patient Spontanous Breathing and Patient connected to face mask oxygen  Post-op Assessment: Report given to RN and Post -op Vital signs reviewed and stable  Post vital signs: Reviewed and stable  Last Vitals:  Vitals Value Taken Time  BP 174/80 07/07/23 1055  Temp    Pulse 88 07/07/23 1056  Resp 14 07/07/23 1056  SpO2 100 % 07/07/23 1056  Vitals shown include unfiled device data.  Last Pain:  Vitals:   07/07/23 0834  TempSrc:   PainSc: 6          Complications: No notable events documented.

## 2023-07-07 NOTE — Anesthesia Postprocedure Evaluation (Signed)
Anesthesia Post Note  Patient: [name]  Procedure(s) Performed: LAPAROSCOPIC BILATERAL INGUINAL HERNIA REPAIR WITH MESH (Bilateral)     Patient location during evaluation: PACU Anesthesia Type: General Level of consciousness: awake and alert Pain management: pain level controlled Vital Signs Assessment: post-procedure vital signs reviewed and stable Respiratory status: spontaneous breathing, nonlabored ventilation, respiratory function stable and patient connected to nasal cannula oxygen Cardiovascular status: blood pressure returned to baseline and stable Postop Assessment: no apparent nausea or vomiting Anesthetic complications: no   No notable events documented.  Last Vitals:  Vitals:   07/07/23 1115 07/07/23 1145  BP: (!) 159/75 127/71  Pulse: 76 70  Resp: 13 15  Temp:  (!) 36.2 C  SpO2: 99% 99%    Last Pain:  Vitals:   07/07/23 1145  TempSrc:   PainSc: 0-No pain                 Earl Lites P Ariona Deschene

## 2023-07-08 ENCOUNTER — Encounter (HOSPITAL_COMMUNITY): Payer: Self-pay | Admitting: Surgery

## 2023-07-17 DIAGNOSIS — C44729 Squamous cell carcinoma of skin of left lower limb, including hip: Secondary | ICD-10-CM | POA: Diagnosis not present

## 2023-07-17 DIAGNOSIS — D485 Neoplasm of uncertain behavior of skin: Secondary | ICD-10-CM | POA: Diagnosis not present

## 2023-07-17 DIAGNOSIS — Z85828 Personal history of other malignant neoplasm of skin: Secondary | ICD-10-CM | POA: Diagnosis not present

## 2023-08-12 DIAGNOSIS — M810 Age-related osteoporosis without current pathological fracture: Secondary | ICD-10-CM | POA: Diagnosis not present

## 2023-08-12 DIAGNOSIS — E785 Hyperlipidemia, unspecified: Secondary | ICD-10-CM | POA: Diagnosis not present

## 2023-08-12 DIAGNOSIS — Z0000 Encounter for general adult medical examination without abnormal findings: Secondary | ICD-10-CM | POA: Diagnosis not present

## 2023-08-19 DIAGNOSIS — F4320 Adjustment disorder, unspecified: Secondary | ICD-10-CM | POA: Diagnosis not present

## 2023-08-19 DIAGNOSIS — I872 Venous insufficiency (chronic) (peripheral): Secondary | ICD-10-CM | POA: Diagnosis not present

## 2023-08-19 DIAGNOSIS — M810 Age-related osteoporosis without current pathological fracture: Secondary | ICD-10-CM | POA: Diagnosis not present

## 2023-08-19 DIAGNOSIS — Z1339 Encounter for screening examination for other mental health and behavioral disorders: Secondary | ICD-10-CM | POA: Diagnosis not present

## 2023-08-19 DIAGNOSIS — J309 Allergic rhinitis, unspecified: Secondary | ICD-10-CM | POA: Diagnosis not present

## 2023-08-19 DIAGNOSIS — Z23 Encounter for immunization: Secondary | ICD-10-CM | POA: Diagnosis not present

## 2023-08-19 DIAGNOSIS — R82998 Other abnormal findings in urine: Secondary | ICD-10-CM | POA: Diagnosis not present

## 2023-08-19 DIAGNOSIS — E785 Hyperlipidemia, unspecified: Secondary | ICD-10-CM | POA: Diagnosis not present

## 2023-08-19 DIAGNOSIS — B029 Zoster without complications: Secondary | ICD-10-CM | POA: Diagnosis not present

## 2023-08-19 DIAGNOSIS — Z1331 Encounter for screening for depression: Secondary | ICD-10-CM | POA: Diagnosis not present

## 2023-08-19 DIAGNOSIS — Z0000 Encounter for general adult medical examination without abnormal findings: Secondary | ICD-10-CM | POA: Diagnosis not present

## 2023-08-19 DIAGNOSIS — L309 Dermatitis, unspecified: Secondary | ICD-10-CM | POA: Diagnosis not present

## 2023-08-19 DIAGNOSIS — I700 Atherosclerosis of aorta: Secondary | ICD-10-CM | POA: Diagnosis not present

## 2023-08-26 ENCOUNTER — Other Ambulatory Visit (HOSPITAL_COMMUNITY): Payer: Self-pay | Admitting: *Deleted

## 2023-08-27 ENCOUNTER — Encounter (HOSPITAL_COMMUNITY)
Admission: RE | Admit: 2023-08-27 | Discharge: 2023-08-27 | Disposition: A | Discharge status: Home / Self Care | Payer: Medicare Other | Source: Ambulatory Visit | Attending: Internal Medicine | Admitting: Internal Medicine

## 2023-08-27 DIAGNOSIS — M810 Age-related osteoporosis without current pathological fracture: Secondary | ICD-10-CM | POA: Diagnosis not present

## 2023-08-27 MED ORDER — DENOSUMAB 60 MG/ML ~~LOC~~ SOSY
PREFILLED_SYRINGE | SUBCUTANEOUS | Status: AC
  Filled 2023-08-27: qty 1

## 2023-08-27 MED ORDER — DENOSUMAB 60 MG/ML ~~LOC~~ SOSY
60.0000 mg | PREFILLED_SYRINGE | Freq: Once | SUBCUTANEOUS | Status: AC
  Administered 2023-08-27: 60 mg via SUBCUTANEOUS

## 2023-09-12 ENCOUNTER — Ambulatory Visit (HOSPITAL_COMMUNITY): Payer: Medicare Other | Attending: Cardiovascular Disease

## 2023-09-12 DIAGNOSIS — I350 Nonrheumatic aortic (valve) stenosis: Secondary | ICD-10-CM | POA: Insufficient documentation

## 2023-09-12 LAB — ECHOCARDIOGRAM COMPLETE
AR max vel: 1.39 cm2
AV Area VTI: 1.38 cm2
AV Area mean vel: 1.28 cm2
AV Mean grad: 18 mm[Hg]
AV Peak grad: 31.4 mm[Hg]
Ao pk vel: 2.8 m/s
Area-P 1/2: 3.48 cm2
P 1/2 time: 516 ms
S' Lateral: 2.4 cm

## 2023-09-17 DIAGNOSIS — Z85828 Personal history of other malignant neoplasm of skin: Secondary | ICD-10-CM | POA: Diagnosis not present

## 2023-09-17 DIAGNOSIS — Z23 Encounter for immunization: Secondary | ICD-10-CM | POA: Diagnosis not present

## 2023-09-17 DIAGNOSIS — I8311 Varicose veins of right lower extremity with inflammation: Secondary | ICD-10-CM | POA: Diagnosis not present

## 2023-09-17 DIAGNOSIS — I872 Venous insufficiency (chronic) (peripheral): Secondary | ICD-10-CM | POA: Diagnosis not present

## 2023-09-17 DIAGNOSIS — I8312 Varicose veins of left lower extremity with inflammation: Secondary | ICD-10-CM | POA: Diagnosis not present

## 2023-09-17 DIAGNOSIS — L570 Actinic keratosis: Secondary | ICD-10-CM | POA: Diagnosis not present

## 2023-12-04 ENCOUNTER — Ambulatory Visit: Payer: Medicare Other | Attending: Cardiovascular Disease | Admitting: Cardiovascular Disease

## 2023-12-04 ENCOUNTER — Encounter: Payer: Self-pay | Admitting: Cardiovascular Disease

## 2023-12-04 VITALS — BP 170/80 | HR 63 | Ht 59.0 in | Wt 102.8 lb

## 2023-12-04 DIAGNOSIS — E782 Mixed hyperlipidemia: Secondary | ICD-10-CM | POA: Insufficient documentation

## 2023-12-04 DIAGNOSIS — I350 Nonrheumatic aortic (valve) stenosis: Secondary | ICD-10-CM | POA: Diagnosis not present

## 2023-12-04 DIAGNOSIS — I10 Essential (primary) hypertension: Secondary | ICD-10-CM | POA: Insufficient documentation

## 2023-12-04 MED ORDER — AMLODIPINE BESYLATE 5 MG PO TABS: 5.0000 mg | ORAL_TABLET | Freq: Every day | ORAL | 3 refills | Status: DC

## 2023-12-04 NOTE — Progress Notes (Signed)
Cardiology Office Note:    Date:  12/04/2023   ID:  [name], DOB May 30, 1933, MRN [id number]  PCP:  Rodrigo Ran, MD   [facility] Providers Cardiologist:  Tonny Bollman, MD     Referring MD: Rodrigo Ran, MD   Chief Complaint  Patient presents with   Follow-up    History of Present Illness:    [name] is a 89 y.o. female with a hx of mild aortic stenosis and mixed hyperlipidemia, presenting for follow-up evaluation. She has had some problems with chronic left leg swelling over the years felt to be related to lymphedema. Over the past 2 years she has been noted to have a heart murmur and has been diagnosed with mild to moderate aortic stenosis.   The patient is here alone today.  She has been doing pretty well.  She recently got together with her extended family over the holidays and had a good visit with them.  She is having some problems with low back pain but otherwise is doing well.  No chest pain, chest pressure, shortness of breath, or heart palpitations.  She brings in her homemade cookies for [place] today.  Current Medications: Current Meds  Medication Sig   amLODipine (NORVASC) 5 MG tablet Take 1 tablet (5 mg total) by mouth daily.   Ascorbic Acid (VITAMIN C PO) Take 1 tablet by mouth in the morning and at bedtime. Gummies   BD PEN NEEDLE NANO 2ND GEN 32G X 4 MM MISC SMARTSIG:Pre-Filled Pen Syringe SUB-Q Daily   Calcium Carbonate-Vitamin D (CALCIUM-D PO) Take 1 tablet by mouth in the morning and at bedtime.   Cholecalciferol (VITAMIN D3 PO) Take 1 tablet by mouth in the morning and at bedtime.   denosumab (PROLIA) 60 MG/ML SOSY injection Inject 60 mg into the skin every 6 (six) months.   docusate sodium (COLACE) 100 MG capsule Take 1 capsule (100 mg total) by mouth 2 (two) times daily. (Patient taking differently: Take 100 mg by mouth daily.)   ELDERBERRY-VITAMIN C-ZINC PO Take 1 tablet by mouth in the morning and at bedtime. Gummies   ezetimibe  (ZETIA) 10 MG tablet TAKE 1 TABLET DAILY   Probiotic Product (ALIGN) CHEW Chew 1 tablet by mouth in the morning and at bedtime. Gummies   rosuvastatin (CRESTOR) 5 MG tablet Take 5 mg by mouth daily.     Allergies:   Risedronate sodium, Cephalexin, Oxycodone, and Hydrocodone   ROS:   Please see the history of present illness.    All other systems reviewed and are negative.  EKGs/Labs/Other Studies Reviewed:    The following studies were reviewed today: Cardiac Studies & Procedures      ECHOCARDIOGRAM  ECHOCARDIOGRAM COMPLETE 09/12/2023  Narrative ECHOCARDIOGRAM REPORT    Patient Name:   [name] Date of Exam: 09/12/2023 Medical Rec #:  [id number]         Height:       59.0 in Accession #:    [id number]        Weight:       100.0 lb Date of Birth:  June 24, 1933          BSA:          1.374 m Patient Age:    89 years          BP:           126/78 mmHg Patient Gender: F  HR:           63 bpm. Exam Location:  [address]  Procedure: 2D Echo, Cardiac Doppler and Color Doppler  Indications:    I35.0 Aortic Stenosis  History:        Patient has prior history of Echocardiogram examinations, most recent 09/25/2022. Risk Factors:Dyslipidemia.  Sonographer:    Sedonia Small Rodgers-Jones RDCS Referring Phys: [id number] Yerick Eggebrecht  IMPRESSIONS   1. Left ventricular ejection fraction, by estimation, is 65 to 70%. The left ventricle has normal function. The left ventricle has no regional wall motion abnormalities. Left ventricular diastolic parameters are consistent with Grade I diastolic dysfunction (impaired relaxation). Elevated left atrial pressure. 2. Right ventricular systolic function is normal. The right ventricular size is normal. 3. The mitral valve is normal in structure. Mild mitral valve regurgitation. No evidence of mitral stenosis. Moderate mitral annular calcification. 4. The aortic valve is calcified. Aortic valve regurgitation is mild. Moderate aortic  valve stenosis. 5. The inferior vena cava is normal in size with greater than 50% respiratory variability, suggesting right atrial pressure of 3 mmHg.  FINDINGS Left Ventricle: Left ventricular ejection fraction, by estimation, is 65 to 70%. The left ventricle has normal function. The left ventricle has no regional wall motion abnormalities. The left ventricular internal cavity size was normal in size. There is no left ventricular hypertrophy. Left ventricular diastolic parameters are consistent with Grade I diastolic dysfunction (impaired relaxation). Elevated left atrial pressure.  Right Ventricle: The right ventricular size is normal. Right ventricular systolic function is normal.  Left Atrium: Left atrial size was normal in size.  Right Atrium: Right atrial size was normal in size.  Pericardium: There is no evidence of pericardial effusion.  Mitral Valve: The mitral valve is normal in structure. Moderate mitral annular calcification. Mild mitral valve regurgitation. No evidence of mitral valve stenosis.  Tricuspid Valve: The tricuspid valve is normal in structure. Tricuspid valve regurgitation is trivial. No evidence of tricuspid stenosis.  Aortic Valve: The aortic valve is calcified. Aortic valve regurgitation is mild. Aortic regurgitation PHT measures 516 msec. Moderate aortic stenosis is present. Aortic valve mean gradient measures 18.0 mmHg. Aortic valve peak gradient measures 31.4 mmHg. Aortic valve area, by VTI measures 1.38 cm.  Pulmonic Valve: The pulmonic valve was not well visualized. Pulmonic valve regurgitation is not visualized. No evidence of pulmonic stenosis.  Aorta: The aortic root is normal in size and structure.  Venous: The inferior vena cava is normal in size with greater than 50% respiratory variability, suggesting right atrial pressure of 3 mmHg.  IAS/Shunts: No atrial level shunt detected by color flow Doppler.   LEFT VENTRICLE PLAX 2D LVIDd:         4.00  cm   Diastology LVIDs:         2.40 cm   LV e' medial:    6.90 cm/s LV PW:         0.60 cm   LV E/e' medial:  14.7 LV IVS:        0.60 cm   LV e' lateral:   6.52 cm/s LVOT diam:     2.10 cm   LV E/e' lateral: 15.6 LV SV:         92 LV SV Index:   67 LVOT Area:     3.46 cm   RIGHT VENTRICLE             IVC RV Basal diam:  3.80 cm     IVC diam: 1.30  cm RV S prime:     14.40 cm/s TAPSE (M-mode): 2.4 cm  LEFT ATRIUM             Index        RIGHT ATRIUM           Index LA diam:        3.50 cm 2.55 cm/m   RA Area:     10.40 cm LA Vol (A2C):   32.2 ml 23.44 ml/m  RA Volume:   20.60 ml  15.00 ml/m LA Vol (A4C):   30.1 ml 21.91 ml/m LA Biplane Vol: 33.1 ml 24.09 ml/m AORTIC VALVE AV Area (Vmax):    1.39 cm AV Area (Vmean):   1.28 cm AV Area (VTI):     1.38 cm AV Vmax:           280.00 cm/s AV Vmean:          199.200 cm/s AV VTI:            0.668 m AV Peak Grad:      31.4 mmHg AV Mean Grad:      18.0 mmHg LVOT Vmax:         112.50 cm/s LVOT Vmean:        73.900 cm/s LVOT VTI:          0.266 m LVOT/AV VTI ratio: 0.40 AI PHT:            516 msec  AORTA Ao Root diam: 3.00 cm  MITRAL VALVE                TRICUSPID VALVE MV Area (PHT): 3.48 cm     TR Peak grad:   26.6 mmHg MV Decel Time: 218 msec     TR Vmax:        258.00 cm/s MV E velocity: 101.50 cm/s MV A velocity: 136.50 cm/s  SHUNTS MV E/A ratio:  0.74         Systemic VTI:  0.27 m Systemic Diam: 2.10 cm  Olga Millers MD Electronically signed by Olga Millers MD Signature Date/Time: 09/12/2023/12:50:14 PM    Final   MONITORS  LONG TERM MONITOR (3-14 DAYS) 05/13/2022  Narrative Patch Wear Time:  13 days and 15 hours (2023-06-14T14:30:31-0400 to 2023-06-28T06:10:23-0400)  Patient had a min HR of 44 bpm, max HR of 167 bpm, and avg HR of 68 bpm. Predominant underlying rhythm was Sinus Rhythm. 232 Supraventricular Tachycardia runs occurred, the run with the fastest interval lasting 18 beats with a max rate of  167 bpm (avg 150 bpm); the run with the fastest interval was also the longest. Isolated SVEs were rare (<1.0%), SVE Couplets were rare (<1.0%), and SVE Triplets were rare (<1.0%). Isolated VEs were rare (<1.0%), and no VE Couplets or VE Triplets were present.  Summary: The basic rhythm is normal sinus with an average heart rate of 68 bpm.  There are supraventricular runs present, the longest lasting 18 beats at a rate of 150 bpm.  There is no clear atrial fibrillation or flutter present.  There are no bradycardic events or episodes of AV block.  There is no significant ventricular arrhythmia noted.  No patient triggered events reported.           EKG:   EKG Interpretation Date/Time:  Thursday December 04 2023 09:31:55 EST Ventricular Rate:  64 PR Interval:  142 QRS Duration:  80 QT Interval:  400 QTC Calculation: 412 R Axis:   76  Text Interpretation: Normal  sinus rhythm Nonspecific ST abnormality When compared with ECG of 27-Jan-2021 18:19, PREVIOUS ECG IS PRESENTPAC's are no longer present Confirmed by Tonny Bollman [phone]) on 12/04/2023 9:37:40 AM    Recent Labs: 04/19/2023: ALT 11 06/25/2023: BUN 11; Creatinine, Ser 0.60; Hemoglobin 13.1; Platelets 207; Potassium 4.0; Sodium 140  Recent Lipid Panel    Component Value Date/Time   CHOL 182 09/28/2018 0729   TRIG 53 09/28/2018 0729   HDL 63 09/28/2018 0729   CHOLHDL 2.9 09/28/2018 0729   CHOLHDL 2.3 04/19/2016 0736   VLDL 10 04/19/2016 0736   LDLCALC 108 (H) 09/28/2018 0729   LDLDIRECT 158.8 11/14/2011 0733     Risk Assessment/Calculations:      HYPERTENSION CONTROL Vitals:   12/04/23 0922 12/04/23 0959  BP: (!) 154/86 (!) 170/80    The patient's blood pressure is elevated above target today.  In order to address the patient's elevated BP: A new medication was prescribed today.            Physical Exam:    VS:  BP (!) 170/80   Pulse 63   Ht 4\' 11"  (1.499 m)   Wt 102 lb 12.8 oz (46.6 kg)   SpO2 95%   BMI 20.76  kg/m     Wt Readings from Last 3 Encounters:  12/04/23 102 lb 12.8 oz (46.6 kg)  07/07/23 100 lb (45.4 kg)  06/25/23 100 lb (45.4 kg)     GEN:  Well nourished, well developed in no acute distress HEENT: Normal NECK: No JVD; No carotid bruits LYMPHATICS: No lymphadenopathy CARDIAC: RRR, 2/6 systolic murmur at the right upper sternal border RESPIRATORY:  Clear to auscultation without rales, wheezing or rhonchi  ABDOMEN: Soft, non-tender, non-distended MUSCULOSKELETAL:  No edema; No deformity  SKIN: Warm and dry NEUROLOGIC:  Alert and oriented x 3 PSYCHIATRIC:  Normal affect   Assessment & Plan Mixed hyperlipidemia Treated with Zetia and low-dose rosuvastatin.  Cholesterol is 125, LDL 55. Nonrheumatic aortic valve stenosis Mild to moderate aortic stenosis.  Her murmur remains consistent with mild aortic stenosis.  LVEF is 65 to 70%, mild mitral regurgitation, and mild to moderate aortic stenosis with a mean gradient of 18 mmHg on her most recent echo with a calculated aortic valve area of 1.38 cm.  Continue clinical follow-up. Essential hypertension I reviewed her office readings over time.  Her blood pressure is consistently elevated.  We called in amlodipine 5 mg daily for her.  We discussed the indication and potential side effects for this medicine.  I advised her to obtain a home blood pressure cuff so that she can periodically check her blood pressure a few times per week and record readings.  I went over proper blood pressure monitoring technique with her and advised her to talk to the pharmacist about getting a small cuff because she has small circumference arms.       Medication Adjustments/Labs and Tests Ordered: Current medicines are reviewed at length with the patient today.  Concerns regarding medicines are outlined above.  Orders Placed This Encounter  Procedures   EKG 12-Lead   Meds ordered this encounter  Medications   amLODipine (NORVASC) 5 MG tablet    Sig:  Take 1 tablet (5 mg total) by mouth daily.    Dispense:  90 tablet    Refill:  3    Patient Instructions  Medication Instructions:  Start Amlodipine 5 mg daily *If you need a refill on your cardiac medications before your next appointment, please call  your pharmacy*  Follow-Up: At [facility] you and your health needs are our priority.  As part of our continuing mission to provide you with exceptional heart care, we have created designated Provider Care Teams.  These Care Teams include your primary Cardiologist (physician) and Advanced Practice Providers (APPs -  Physician Assistants and Nurse Practitioners) who all work together to provide you with the care you need, when you need it.  We recommend signing up for the patient portal called "MyChart".  Sign up information is provided on this After Visit Summary.  MyChart is used to connect with patients for Virtual Visits (Telemedicine).  Patients are able to view lab/test results, encounter notes, upcoming appointments, etc.  Non-urgent messages can be sent to your provider as well.   To learn more about what you can do with MyChart, go to [email or web address].    Your next appointment:   1 year(s)  Provider:   Tonny Bollman, MD     Other Instructions   1st Floor: - Lobby - Registration  - Pharmacy  - Lab - Cafe  2nd Floor: - PV Lab - Diagnostic Testing (echo, CT, nuclear med)  3rd Floor: - Vacant  4th Floor: - TCTS (cardiothoracic surgery) - AFib Clinic - Structural Heart Clinic - Vascular Surgery  - Vascular Ultrasound  5th Floor: - HeartCare Cardiology (general and EP) - Clinical Pharmacy for coumadin, hypertension, lipid, weight-loss medications, and med management appointments    Valet parking services will be available as well.          Signed, Tonny Bollman, MD  12/04/2023 9:59 AM    [facility]

## 2023-12-04 NOTE — Assessment & Plan Note (Signed)
Treated with Zetia and low-dose rosuvastatin.  Cholesterol is 125, LDL 55.

## 2023-12-04 NOTE — Patient Instructions (Signed)
Medication Instructions:  Start Amlodipine 5 mg daily *If you need a refill on your cardiac medications before your next appointment, please call your pharmacy*  Follow-Up: At [facility], you and your health needs are our priority.  As part of our continuing mission to provide you with exceptional heart care, we have created designated Provider Care Teams.  These Care Teams include your primary Cardiologist (physician) and Advanced Practice Providers (APPs -  Physician Assistants and Nurse Practitioners) who all work together to provide you with the care you need, when you need it.  We recommend signing up for the patient portal called "MyChart".  Sign up information is provided on this After Visit Summary.  MyChart is used to connect with patients for Virtual Visits (Telemedicine).  Patients are able to view lab/test results, encounter notes, upcoming appointments, etc.  Non-urgent messages can be sent to your provider as well.   To learn more about what you can do with MyChart, go to [email or web address].    Your next appointment:   1 year(s)  Provider:   Tonny Bollman, MD     Other Instructions   1st Floor: - Lobby - Registration  - Pharmacy  - Lab - Cafe  2nd Floor: - PV Lab - Diagnostic Testing (echo, CT, nuclear med)  3rd Floor: - Vacant  4th Floor: - TCTS (cardiothoracic surgery) - AFib Clinic - Structural Heart Clinic - Vascular Surgery  - Vascular Ultrasound  5th Floor: - HeartCare Cardiology (general and EP) - Clinical Pharmacy for coumadin, hypertension, lipid, weight-loss medications, and med management appointments    Valet parking services will be available as well.

## 2023-12-11 ENCOUNTER — Telehealth: Payer: Self-pay | Admitting: Cardiovascular Disease

## 2023-12-11 NOTE — Telephone Encounter (Signed)
Returned call to patient who states that the Amlodipine she started 7 days ago is making her feel awful. This morning LLE was swollen, no swelling to R leg. Reports coughing that's productive, green colored sputum that began last night and . Did at home COVID test that was negative. Headache, weak, lethargic, decreased appetite. Skipped dose this morning and hasn't checked BP.  Advised that this doesn't sound at all like a reaction to the Amlodipine, but rather she's fighting a virus of some sort. She doesn't have home BP cuff yet, but agrees to go get one tomorrow and keep check on BP. Advised she can hold the Amlodipine for a few days until she starts feeling better, then should resume. Pt agrees to plan.

## 2023-12-11 NOTE — Telephone Encounter (Signed)
Pt c/o medication issue:  1. Name of Medication: amLODipine (NORVASC) 5 MG tablet   2. How are you currently taking this medication (dosage and times per day)? As written   3. Are you having a reaction (difficulty breathing--STAT)? No   4. What is your medication issue? Pt called in stating  since starting this medication she has been hurting in her legs and coughing. Please advise.

## 2023-12-15 ENCOUNTER — Other Ambulatory Visit: Payer: Self-pay | Admitting: Cardiovascular Disease

## 2023-12-15 DIAGNOSIS — J069 Acute upper respiratory infection, unspecified: Secondary | ICD-10-CM | POA: Diagnosis not present

## 2023-12-15 DIAGNOSIS — Z1152 Encounter for screening for COVID-19: Secondary | ICD-10-CM | POA: Diagnosis not present

## 2023-12-15 DIAGNOSIS — J029 Acute pharyngitis, unspecified: Secondary | ICD-10-CM | POA: Diagnosis not present

## 2023-12-15 DIAGNOSIS — R5383 Other fatigue: Secondary | ICD-10-CM | POA: Diagnosis not present

## 2023-12-15 DIAGNOSIS — R0981 Nasal congestion: Secondary | ICD-10-CM | POA: Diagnosis not present

## 2023-12-15 DIAGNOSIS — R051 Acute cough: Secondary | ICD-10-CM | POA: Diagnosis not present

## 2024-01-08 ENCOUNTER — Telehealth: Payer: Self-pay | Admitting: Cardiovascular Disease

## 2024-01-08 NOTE — Telephone Encounter (Signed)
 Pt c/o swelling: STAT is pt has developed SOB within 24 hours  How much weight have you gained and in what time span? 3 lbs in a day   If swelling, where is the swelling located? Leg   Are you currently taking a fluid pill? no  Are you currently SOB? A little   Do you have a log of your daily weights (if so, list)?    Have you gained 3 pounds in a day or 5 pounds in a week? yes  Have you traveled recently? no

## 2024-01-09 DIAGNOSIS — Z1231 Encounter for screening mammogram for malignant neoplasm of breast: Secondary | ICD-10-CM | POA: Diagnosis not present

## 2024-01-09 DIAGNOSIS — Z01419 Encounter for gynecological examination (general) (routine) without abnormal findings: Secondary | ICD-10-CM | POA: Diagnosis not present

## 2024-01-09 DIAGNOSIS — Z6821 Body mass index (BMI) 21.0-21.9, adult: Secondary | ICD-10-CM | POA: Diagnosis not present

## 2024-01-09 DIAGNOSIS — R600 Localized edema: Secondary | ICD-10-CM | POA: Diagnosis not present

## 2024-01-09 MED ORDER — LOSARTAN POTASSIUM 50 MG PO TABS: 50.0000 mg | ORAL_TABLET | Freq: Every day | ORAL | 3 refills | Status: AC

## 2024-01-09 NOTE — Telephone Encounter (Signed)
 Spoke with patient who was concerned after going for her mammogram today because the provider told her that she could possibly have a blood clot in her leg. I questioned her specifically about localized swelling, redness, or heat in the L leg and she denied all three things. Advised that these are tell-tale signs of DVT and spoke with her more about lympedema. She was appreciative of call back.

## 2024-01-09 NOTE — Telephone Encounter (Signed)
 Pt is calling back wanting to speak to the nurse she talked to this morning

## 2024-01-09 NOTE — Telephone Encounter (Signed)
 Returned call to patient who states that since starting Amlodipine her chronic left leg swelling from knee down "looks like a balloon" previously  felt to be related to lymphedema. She states she is wearing compression stockings and keeping legs elevated, but that it "didn't blow up like this until I started taking that medication." She confirmed that she's only taking Amlodipine. She checks BP once weekly since starting the medication: 1/31 142/74 2/6 128/71 2/24 140/67 (No other readings to provide) Advised her that it is reasonable to think the Amlodipine could be contributing to this and I would check with MD for alternative BP medication.

## 2024-01-09 NOTE — Telephone Encounter (Signed)
 Yes she should stop amlodipine and start losartan 50 mg daily. thanks

## 2024-01-09 NOTE — Telephone Encounter (Signed)
 Called and spoke with patient who verbalized understanding of stopping Amlodipine and starting Losartan 50mg  daily. [facility].

## 2024-01-15 DIAGNOSIS — L814 Other melanin hyperpigmentation: Secondary | ICD-10-CM | POA: Diagnosis not present

## 2024-01-15 DIAGNOSIS — D225 Melanocytic nevi of trunk: Secondary | ICD-10-CM | POA: Diagnosis not present

## 2024-01-15 DIAGNOSIS — I872 Venous insufficiency (chronic) (peripheral): Secondary | ICD-10-CM | POA: Diagnosis not present

## 2024-01-15 DIAGNOSIS — I8311 Varicose veins of right lower extremity with inflammation: Secondary | ICD-10-CM | POA: Diagnosis not present

## 2024-01-15 DIAGNOSIS — Z85828 Personal history of other malignant neoplasm of skin: Secondary | ICD-10-CM | POA: Diagnosis not present

## 2024-01-15 DIAGNOSIS — I8312 Varicose veins of left lower extremity with inflammation: Secondary | ICD-10-CM | POA: Diagnosis not present

## 2024-01-15 DIAGNOSIS — L570 Actinic keratosis: Secondary | ICD-10-CM | POA: Diagnosis not present

## 2024-01-15 DIAGNOSIS — L821 Other seborrheic keratosis: Secondary | ICD-10-CM | POA: Diagnosis not present

## 2024-02-23 DIAGNOSIS — M7989 Other specified soft tissue disorders: Secondary | ICD-10-CM | POA: Diagnosis not present

## 2024-02-23 DIAGNOSIS — I700 Atherosclerosis of aorta: Secondary | ICD-10-CM | POA: Diagnosis not present

## 2024-02-23 DIAGNOSIS — I10 Essential (primary) hypertension: Secondary | ICD-10-CM | POA: Diagnosis not present

## 2024-02-23 DIAGNOSIS — I350 Nonrheumatic aortic (valve) stenosis: Secondary | ICD-10-CM | POA: Diagnosis not present

## 2024-02-23 DIAGNOSIS — J309 Allergic rhinitis, unspecified: Secondary | ICD-10-CM | POA: Diagnosis not present

## 2024-02-23 DIAGNOSIS — L309 Dermatitis, unspecified: Secondary | ICD-10-CM | POA: Diagnosis not present

## 2024-02-23 DIAGNOSIS — I872 Venous insufficiency (chronic) (peripheral): Secondary | ICD-10-CM | POA: Diagnosis not present

## 2024-02-23 DIAGNOSIS — E785 Hyperlipidemia, unspecified: Secondary | ICD-10-CM | POA: Diagnosis not present

## 2024-02-23 DIAGNOSIS — Z9889 Other specified postprocedural states: Secondary | ICD-10-CM | POA: Diagnosis not present

## 2024-02-23 DIAGNOSIS — D7589 Other specified diseases of blood and blood-forming organs: Secondary | ICD-10-CM | POA: Diagnosis not present

## 2024-02-23 DIAGNOSIS — Z8719 Personal history of other diseases of the digestive system: Secondary | ICD-10-CM | POA: Diagnosis not present

## 2024-02-23 DIAGNOSIS — M810 Age-related osteoporosis without current pathological fracture: Secondary | ICD-10-CM | POA: Diagnosis not present

## 2024-03-02 ENCOUNTER — Ambulatory Visit (HOSPITAL_COMMUNITY)
Admission: RE | Admit: 2024-03-02 | Discharge: 2024-03-02 | Disposition: A | Discharge status: Home / Self Care | Source: Ambulatory Visit | Attending: Emergency Medicine | Admitting: Emergency Medicine

## 2024-03-02 ENCOUNTER — Emergency Department (HOSPITAL_COMMUNITY)
Admission: EM | Admit: 2024-03-02 | Discharge: 2024-03-02 | Discharge status: Against Medical Advice | Attending: Emergency Medicine | Admitting: Emergency Medicine

## 2024-03-02 ENCOUNTER — Encounter (HOSPITAL_COMMUNITY): Payer: Self-pay

## 2024-03-02 ENCOUNTER — Other Ambulatory Visit: Payer: Self-pay

## 2024-03-02 VITALS — BP 132/72 | HR 77 | Temp 97.9°F | Resp 16 | Ht 59.0 in | Wt 100.0 lb

## 2024-03-02 DIAGNOSIS — Z5321 Procedure and treatment not carried out due to patient leaving prior to being seen by health care provider: Secondary | ICD-10-CM | POA: Insufficient documentation

## 2024-03-02 DIAGNOSIS — S81812A Laceration without foreign body, left lower leg, initial encounter: Secondary | ICD-10-CM

## 2024-03-02 DIAGNOSIS — Z5189 Encounter for other specified aftercare: Secondary | ICD-10-CM | POA: Insufficient documentation

## 2024-03-02 MED ORDER — MUPIROCIN CALCIUM 2 % EX CREA: 1.0000 | TOPICAL_CREAM | Freq: Two times a day (BID) | CUTANEOUS | 0 refills | Status: DC

## 2024-03-02 MED ORDER — SULFAMETHOXAZOLE-TRIMETHOPRIM 800-160 MG PO TABS: 1.0000 | ORAL_TABLET | Freq: Two times a day (BID) | ORAL | 0 refills | Status: DC

## 2024-03-02 NOTE — ED Provider Notes (Signed)
 [facility]    CSN: [id number] Arrival date & time: 03/02/24  1439      History   Chief Complaint Chief Complaint  Patient presents with   Leg Injury    HPI [name] is a 89 y.o. female.   Patient presents with injury to left lower leg that occurred yesterday.  Patient states that she was working outside in the yard when she went to climb over a concrete wall and scraped her leg.  Patient denies any pain, but states that the wound is oozing clear liquid.  Patient denies any other injuries, denies hitting her head or loss of consciousness.  Denies taking blood thinners.  The history is provided by the patient and medical records.    Past Medical History:  Diagnosis Date   Arthritis    Cataract    Dysrhythmia    palpitations   History of hiatal hernia    Hx of colonic polyps    Hypercholesterolemia    Lumbar back pain    Neck pain    Osteoporosis    Swelling of ankle 01/12/2016   bilateral. Pt is taking lasix x 2 weeks and wearing compression stockings    Patient Active Problem List   Diagnosis Date Noted   Acute blood loss anemia 08/09/2021   Encounter for removal of sutures 08/09/2021   Fall on same level from slipping, tripping or stumbling 08/09/2021   Closed left hip fracture, initial encounter (HCC) 01/27/2021   Scalp laceration    Open wound of lower leg 04/21/2020   Unilateral inguinal hernia, without obstruction or gangrene, not specified as recurrent 04/21/2020   Right lower quadrant pain 01/03/2020   Adjustment disorder 02/06/2018   Disorder of soft tissue 02/06/2018   Peripheral venous insufficiency 12/22/2015   Encounter for screening for other disorder 10/26/2015   Other specified diseases of blood and blood-forming organs 10/26/2015   Localized edema 05/01/2015   Allergic rhinitis 02/08/2015   Dermatitis 12/05/2014   Herpes zoster without complication 09/08/2014   Age-related osteoporosis without current pathological  fracture 09/05/2014   Hyperlipidemia 09/05/2014   Postmenopausal state 09/05/2014   Palpitations 12/04/2012   Chest pain 11/20/2012   DJD (degenerative joint disease) 11/22/2011   NECK PAIN 03/01/2008   BACK PAIN, LUMBAR 03/01/2008   COLONIC POLYPS 02/29/2008   HYPERCHOLESTEROLEMIA 02/29/2008   Osteoporosis 02/29/2008    Past Surgical History:  Procedure Laterality Date   ABDOMINAL HYSTERECTOMY  12/2003   DR. Neal--bso, A/P repair   cataract surgery Bilateral    COLONOSCOPY     HIP ARTHROPLASTY Left 01/28/2021   Procedure: ARTHROPLASTY BIPOLAR HIP (HEMIARTHROPLASTY);  Surgeon: Adah Acron, MD;  Location: [facility];  Service: Orthopedics;  Laterality: Left;  depuy, Lateral position with Mark II   INGUINAL HERNIA REPAIR Bilateral 07/07/2023   Procedure: LAPAROSCOPIC BILATERAL INGUINAL HERNIA REPAIR WITH MESH;  Surgeon: Stechschulte, Avon Boers, MD;  Location: [facility];  Service: General;  Laterality: Bilateral;   LUMBAR LAMINECTOMY  1985   Dr. Dante Dyer   POLYPECTOMY     surgery for removal of rectal polyp  07/1995   Dr. Ilah Males   TUBAL LIGATION      OB History   No obstetric history on file.      Home Medications    Prior to Admission medications   Medication Sig Start Date End Date Taking? Authorizing Provider  mupirocin  cream (BACTROBAN ) 2 % Apply 1 Application topically 2 (two) times daily. 03/02/24  Yes Levora Reas  A, NP  sulfamethoxazole -trimethoprim  (BACTRIM  DS) 800-160 MG tablet Take 1 tablet by mouth 2 (two) times daily for 7 days. 03/02/24 03/09/24 Yes Levora Reas A, NP  Ascorbic Acid (VITAMIN C PO) Take 1 tablet by mouth in the morning and at bedtime. Gummies    [provider]  BD PEN NEEDLE NANO 2ND GEN 32G X 4 MM MISC SMARTSIG:Pre-Filled Pen Syringe SUB-Q Daily 05/23/21   [provider]  Calcium  Carbonate-Vitamin D  (CALCIUM -D PO) Take 1 tablet by mouth in the morning and at bedtime.    [provider]  Cholecalciferol (VITAMIN D3 PO)  Take 1 tablet by mouth in the morning and at bedtime.    [provider]  denosumab  (PROLIA ) 60 MG/ML SOSY injection Inject 60 mg into the skin every 6 (six) months. 11/23/14   [provider]  ELDERBERRY-VITAMIN C-ZINC PO Take 1 tablet by mouth in the morning and at bedtime. Gummies    [provider]  ezetimibe  (ZETIA ) 10 MG tablet TAKE 1 TABLET DAILY 12/17/23   Arnoldo Lapping, MD  losartan  (COZAAR ) 50 MG tablet Take 1 tablet (50 mg total) by mouth daily. 01/09/24   Arnoldo Lapping, MD  Probiotic Product (ALIGN) CHEW Chew 1 tablet by mouth in the morning and at bedtime. Gummies    [provider]  rosuvastatin (CRESTOR) 5 MG tablet Take 5 mg by mouth daily. 08/06/21   [provider]    Family History Family History  Problem Relation Age of Onset   Stroke Mother    Heart Problems Sister        triple bipass   Heart Problems Brother        blocked arteries, stent.   Stomach cancer Neg Hx    Colon cancer Neg Hx    Pancreatic cancer Neg Hx    Rectal cancer Neg Hx    Colon polyps Neg Hx    Esophageal cancer Neg Hx     Social History Social History   Tobacco Use   Smoking status: Former    Current packs/day: 0.00    Average packs/day: 0.3 packs/day for 1 year (0.3 ttl pk-yrs)    Types: Cigarettes    Start date: 11/11/1953    Quit date: 11/11/1954    Years since quitting: 69.3    Passive exposure: Never   Smokeless tobacco: Never  Vaping Use   Vaping status: Never Used  Substance Use Topics   Alcohol  use: Not Currently   Drug use: No     Allergies   Risedronate sodium, Amlodipine , Cephalexin , Oxycodone , and Hydrocodone    Review of Systems Review of Systems  Per HPI  Physical Exam Triage Vital Signs ED Triage Vitals  Encounter Vitals Group     BP 03/02/24 1505 132/72     Systolic BP Percentile --      Diastolic BP Percentile --      Pulse Rate 03/02/24 1505 77     Resp 03/02/24 1505 16     Temp 03/02/24 1505 97.9 F (36.6  C)     Temp Source 03/02/24 1505 Oral     SpO2 03/02/24 1505 95 %     Weight 03/02/24 1507 100 lb (45.4 kg)     Height 03/02/24 1507 4\' 11"  (1.499 m)     Head Circumference --      Peak Flow --      Pain Score 03/02/24 1507 0     Pain Loc --      Pain Education --  Exclude from Growth Chart --    No data found.  Updated Vital Signs BP 132/72 (BP Location: Right Arm)   Pulse 77   Temp 97.9 F (36.6 C) (Oral)   Resp 16   Ht 4\' 11"  (1.499 m)   Wt 100 lb (45.4 kg)   SpO2 95%   BMI 20.20 kg/m   Visual Acuity Right Eye Distance:   Left Eye Distance:   Bilateral Distance:    Right Eye Near:   Left Eye Near:    Bilateral Near:     Physical Exam Vitals and nursing note reviewed.  Constitutional:      General: She is awake. She is not in acute distress.    Appearance: Normal appearance. She is well-developed and well-groomed. She is not ill-appearing.  Skin:    General: Skin is warm and dry.     Findings: Wound present.       Neurological:     Mental Status: She is alert.  Psychiatric:        Behavior: Behavior is cooperative.      UC Treatments / Results  Labs (all labs ordered are listed, but only abnormal results are displayed) Labs Reviewed - No data to display  EKG   Radiology No results found.  Procedures Procedures (including critical care time)  Medications Ordered in UC Medications - No data to display  Initial Impression / Assessment and Plan / UC Course  I have reviewed the triage vital signs and the nursing notes.  Pertinent labs & imaging results that were available during my care of the patient were reviewed by me and considered in my medical decision making (see chart for details).     Patient is well-appearing.  Vitals are stable.  Upon assessment there is a 5 cm x 4 cm superficial skin tear to the left lower leg.  Bleeding is controlled, there is clear fluid oozing from the area.  Without surrounding erythema or  swelling.  Provided wound care in clinic.  Discussed proper wound care at home.  Prescribed mupirocin  to apply to the area.  Prescribed Bactrim  for infection prevention.  Discussed return precautions. Final Clinical Impressions(s) / UC Diagnoses   Final diagnoses:  Skin tear of left lower leg without complication, initial encounter     Discharge Instructions      Start taking Bactrim  twice daily for infection prevention. Keep your wound clean dry and covered.  Use mupirocin  ointment prior to applying dressing. I recommend doing dressing changes twice daily or as needed if there is significant drainage. Return here if you have yellow, puslike drainage, spreading redness, swelling, or increased pain.    ED Prescriptions     Medication Sig Dispense Auth. Provider   mupirocin  cream (BACTROBAN ) 2 % Apply 1 Application topically 2 (two) times daily. 15 g Levora Reas A, NP   sulfamethoxazole -trimethoprim  (BACTRIM  DS) 800-160 MG tablet Take 1 tablet by mouth 2 (two) times daily for 7 days. 14 tablet Levora Reas A, NP      PDMP not reviewed this encounter.   Levora Reas A, NP 03/02/24 [phone]

## 2024-03-02 NOTE — ED Triage Notes (Signed)
 Pt here for wound check on left lower leg. Pt fell while doing yard work. Denies taking blood thinners.

## 2024-03-02 NOTE — Discharge Instructions (Addendum)
 Start taking Bactrim  twice daily for infection prevention. Keep your wound clean dry and covered.  Use mupirocin  ointment prior to applying dressing. I recommend doing dressing changes twice daily or as needed if there is significant drainage. Return here if you have yellow, puslike drainage, spreading redness, swelling, or increased pain.

## 2024-03-02 NOTE — ED Triage Notes (Signed)
 Patient here today with c/o a left lower leg injury yesterday evening. Patient was working outside in the yard she went to climb over some steps and scraped her leg. Patient denies pain but the wound is oozing clear liquid.

## 2024-03-09 ENCOUNTER — Ambulatory Visit (HOSPITAL_COMMUNITY)
Admission: EM | Admit: 2024-03-09 | Discharge: 2024-03-09 | Disposition: A | Discharge status: Home / Self Care | Attending: Family Medicine | Admitting: Family Medicine

## 2024-03-09 ENCOUNTER — Encounter (HOSPITAL_COMMUNITY): Payer: Self-pay | Admitting: *Deleted

## 2024-03-09 DIAGNOSIS — Z5189 Encounter for other specified aftercare: Secondary | ICD-10-CM | POA: Diagnosis not present

## 2024-03-09 DIAGNOSIS — L03116 Cellulitis of left lower limb: Secondary | ICD-10-CM

## 2024-03-09 MED ORDER — MUPIROCIN 2 % EX OINT: 1.0000 | TOPICAL_OINTMENT | Freq: Two times a day (BID) | CUTANEOUS | 0 refills | Status: DC

## 2024-03-09 MED ORDER — DOXYCYCLINE HYCLATE 100 MG PO CAPS: 100.0000 mg | ORAL_CAPSULE | Freq: Two times a day (BID) | ORAL | 0 refills | Status: DC

## 2024-03-09 NOTE — ED Provider Notes (Signed)
 [facility]    CSN: [id number] Arrival date & time: 03/09/24  2841      History   Chief Complaint Chief Complaint  Patient presents with   Wound Check    HPI [name] is a 89 y.o. female.    Wound Check  Patient is here for wound check.  She was seen last week after scraping her left shin on bricks at home.  She is almost done with augmentin, and using bactroban  as well.  She is here to have it looked at.  She has some redness and slight swelling to the leg at this time that is new/different.  No fevers/chills.        Past Medical History:  Diagnosis Date   Arthritis    Cataract    Dysrhythmia    palpitations   History of hiatal hernia    Hx of colonic polyps    Hypercholesterolemia    Lumbar back pain    Neck pain    Osteoporosis    Swelling of ankle 01/12/2016   bilateral. Pt is taking lasix x 2 weeks and wearing compression stockings    Patient Active Problem List   Diagnosis Date Noted   Acute blood loss anemia 08/09/2021   Encounter for removal of sutures 08/09/2021   Fall on same level from slipping, tripping or stumbling 08/09/2021   Closed left hip fracture, initial encounter (HCC) 01/27/2021   Scalp laceration    Open wound of lower leg 04/21/2020   Unilateral inguinal hernia, without obstruction or gangrene, not specified as recurrent 04/21/2020   Right lower quadrant pain 01/03/2020   Adjustment disorder 02/06/2018   Disorder of soft tissue 02/06/2018   Peripheral venous insufficiency 12/22/2015   Encounter for screening for other disorder 10/26/2015   Other specified diseases of blood and blood-forming organs 10/26/2015   Localized edema 05/01/2015   Allergic rhinitis 02/08/2015   Dermatitis 12/05/2014   Herpes zoster without complication 09/08/2014   Age-related osteoporosis without current pathological fracture 09/05/2014   Hyperlipidemia 09/05/2014   Postmenopausal state 09/05/2014   Palpitations 12/04/2012    Chest pain 11/20/2012   DJD (degenerative joint disease) 11/22/2011   NECK PAIN 03/01/2008   BACK PAIN, LUMBAR 03/01/2008   COLONIC POLYPS 02/29/2008   HYPERCHOLESTEROLEMIA 02/29/2008   Osteoporosis 02/29/2008    Past Surgical History:  Procedure Laterality Date   ABDOMINAL HYSTERECTOMY  12/2003   DR. Neal--bso, A/P repair   cataract surgery Bilateral    COLONOSCOPY     HIP ARTHROPLASTY Left 01/28/2021   Procedure: ARTHROPLASTY BIPOLAR HIP (HEMIARTHROPLASTY);  Surgeon: Adah Acron, MD;  Location: [facility];  Service: Orthopedics;  Laterality: Left;  depuy, Lateral position with Mark II   INGUINAL HERNIA REPAIR Bilateral 07/07/2023   Procedure: LAPAROSCOPIC BILATERAL INGUINAL HERNIA REPAIR WITH MESH;  Surgeon: Stechschulte, Avon Boers, MD;  Location: [facility];  Service: General;  Laterality: Bilateral;   LUMBAR LAMINECTOMY  1985   Dr. Dante Dyer   POLYPECTOMY     surgery for removal of rectal polyp  07/1995   Dr. Ilah Males   TUBAL LIGATION      OB History   No obstetric history on file.      Home Medications    Prior to Admission medications   Medication Sig Start Date End Date Taking? Authorizing Provider  Ascorbic Acid (VITAMIN C PO) Take 1 tablet by mouth in the morning and at bedtime. Gummies   Yes [provider]  BD PEN NEEDLE  NANO 2ND GEN 32G X 4 MM MISC SMARTSIG:Pre-Filled Pen Syringe SUB-Q Daily 05/23/21  Yes [provider]  Calcium  Carbonate-Vitamin D  (CALCIUM -D PO) Take 1 tablet by mouth in the morning and at bedtime.   Yes [provider]  Cholecalciferol (VITAMIN D3 PO) Take 1 tablet by mouth in the morning and at bedtime.   Yes [provider]  ELDERBERRY-VITAMIN C-ZINC PO Take 1 tablet by mouth in the morning and at bedtime. Gummies   Yes [provider]  ezetimibe  (ZETIA ) 10 MG tablet TAKE 1 TABLET DAILY 12/17/23  Yes Arnoldo Lapping, MD  losartan  (COZAAR ) 50 MG tablet Take 1 tablet (50 mg total) by mouth daily. 01/09/24  Yes  Arnoldo Lapping, MD  mupirocin  cream (BACTROBAN ) 2 % Apply 1 Application topically 2 (two) times daily. 03/02/24  Yes Levora Reas A, NP  Probiotic Product (ALIGN) CHEW Chew 1 tablet by mouth in the morning and at bedtime. Gummies   Yes [provider]  rosuvastatin (CRESTOR) 5 MG tablet Take 5 mg by mouth daily. 08/06/21  Yes [provider]  sulfamethoxazole -trimethoprim  (BACTRIM  DS) 800-160 MG tablet Take 1 tablet by mouth 2 (two) times daily for 7 days. 03/02/24 03/09/24 Yes Levora Reas A, NP  denosumab  (PROLIA ) 60 MG/ML SOSY injection Inject 60 mg into the skin every 6 (six) months. 11/23/14   [provider]    Family History Family History  Problem Relation Age of Onset   Stroke Mother    Heart Problems Sister        triple bipass   Heart Problems Brother        blocked arteries, stent.   Stomach cancer Neg Hx    Colon cancer Neg Hx    Pancreatic cancer Neg Hx    Rectal cancer Neg Hx    Colon polyps Neg Hx    Esophageal cancer Neg Hx     Social History Social History   Tobacco Use   Smoking status: Former    Current packs/day: 0.00    Average packs/day: 0.3 packs/day for 1 year (0.3 ttl pk-yrs)    Types: Cigarettes    Start date: 11/11/1953    Quit date: 11/11/1954    Years since quitting: 69.3    Passive exposure: Never   Smokeless tobacco: Never  Vaping Use   Vaping status: Never Used  Substance Use Topics   Alcohol  use: Not Currently   Drug use: No     Allergies   Risedronate sodium, Amlodipine , Cephalexin , Oxycodone , and Hydrocodone    Review of Systems Review of Systems  Constitutional: Negative.   HENT: Negative.    Respiratory: Negative.    Cardiovascular: Negative.   Gastrointestinal: Negative.   Skin:  Positive for wound.     Physical Exam Triage Vital Signs ED Triage Vitals  Encounter Vitals Group     BP 03/09/24 0912 130/71     Systolic BP Percentile --      Diastolic BP Percentile --      Pulse Rate  03/09/24 0912 72     Resp 03/09/24 0912 16     Temp 03/09/24 0912 97.9 F (36.6 C)     Temp Source 03/09/24 0912 Oral     SpO2 03/09/24 0912 98 %     Weight --      Height --      Head Circumference --      Peak Flow --      Pain Score 03/09/24 0911 0  Pain Loc --      Pain Education --      Exclude from Growth Chart --    No data found.  Updated Vital Signs BP 130/71 (BP Location: Left Arm)   Pulse 72   Temp 97.9 F (36.6 C) (Oral)   Resp 16   SpO2 98%   Visual Acuity Right Eye Distance:   Left Eye Distance:   Bilateral Distance:    Right Eye Near:   Left Eye Near:    Bilateral Near:     Physical Exam Constitutional:      Appearance: Normal appearance. She is normal weight.  Skin:    Comments: At the left lower leg/shin is an irregularly sized skin lesion;  no drainage from the lesion, yellow tissue forming over the lesion;  There is redness and warmth surrounding the lesion, mostly inferior;  no pain/tenderness noted to this area  Neurological:     General: No focal deficit present.     Mental Status: She is alert.      UC Treatments / Results  Labs (all labs ordered are listed, but only abnormal results are displayed) Labs Reviewed - No data to display  EKG   Radiology No results found.  Procedures Procedures (including critical care time)  Medications Ordered in UC Medications - No data to display  Initial Impression / Assessment and Plan / UC Course  I have reviewed the triage vital signs and the nursing notes.  Pertinent labs & imaging results that were available during my care of the patient were reviewed by me and considered in my medical decision making (see chart for details).   Final Clinical Impressions(s) / UC Diagnoses   Final diagnoses:  Visit for wound check  Cellulitis of left lower extremity     Discharge Instructions      You were seen today for a wound check.  I have sent out a different oral antibiotic to take,  and a topical ointment.  Please keep the area covered while you are out and active.  You may leave it open and elevated when home.  Please return if not improving.     ED Prescriptions     Medication Sig Dispense Auth. Provider   doxycycline  (VIBRAMYCIN ) 100 MG capsule Take 1 capsule (100 mg total) by mouth 2 (two) times daily. 20 capsule Genesys Coggeshall, MD   mupirocin  ointment (BACTROBAN ) 2 % Apply 1 Application topically 2 (two) times daily. 22 g Lesle Ras, MD      PDMP not reviewed this encounter.   Lesle Ras, MD 03/09/24 [phone]

## 2024-03-09 NOTE — ED Triage Notes (Signed)
 Pt here to have the wound on her left leg checked, the area is still red. She states that she has been using ointment and taking oral antibiotics.   She would like to know if there is some other ointment she can use due to cost its 430 a tube

## 2024-03-09 NOTE — Discharge Instructions (Signed)
 You were seen today for a wound check.  I have sent out a different oral antibiotic to take, and a topical ointment.  Please keep the area covered while you are out and active.  You may leave it open and elevated when home.  Please return if not improving.

## 2024-03-23 ENCOUNTER — Encounter (HOSPITAL_COMMUNITY): Payer: Self-pay

## 2024-03-23 ENCOUNTER — Ambulatory Visit (HOSPITAL_COMMUNITY)
Admission: RE | Admit: 2024-03-23 | Discharge: 2024-03-23 | Disposition: A | Discharge status: Home / Self Care | Source: Ambulatory Visit | Attending: Family Medicine | Admitting: Family Medicine

## 2024-03-23 VITALS — BP 159/82 | HR 69 | Temp 98.0°F | Resp 18

## 2024-03-23 DIAGNOSIS — L089 Local infection of the skin and subcutaneous tissue, unspecified: Secondary | ICD-10-CM

## 2024-03-23 DIAGNOSIS — T148XXA Other injury of unspecified body region, initial encounter: Secondary | ICD-10-CM

## 2024-03-23 MED ORDER — CEFUROXIME AXETIL 250 MG PO TABS: 250.0000 mg | ORAL_TABLET | Freq: Two times a day (BID) | ORAL | 0 refills | Status: AC

## 2024-03-23 MED ORDER — MUPIROCIN 2 % EX OINT: 1.0000 | TOPICAL_OINTMENT | Freq: Two times a day (BID) | CUTANEOUS | 0 refills | Status: AC

## 2024-03-23 NOTE — Discharge Instructions (Addendum)
 Put mupirocin  ointment on the sore area twice daily until improved  Cefuroxime 250 mg--1 tablet 2 times daily with a meal.   Please follow-up with your primary care about this issue  Seeing wound care could help this heal easier.

## 2024-03-23 NOTE — ED Provider Notes (Signed)
 [facility]    CSN: [id number] Arrival date & time: 03/23/24  0815      History   Chief Complaint Chief Complaint  Patient presents with   Wound Check    HPI [name] is a 89 y.o. female.    Wound Check  Here for a wound that was sustained about 3 weeks ago.  It is on her left lower leg around the shin.  She was seen right after it happened and then was seen 2 weeks ago for possible wound infection.  She was prescribed doxycycline  and mupirocin .  She states it is improved, but it is still red or pink some around the wound.  No fever or chills.  No drainage from the site.  Past Medical History:  Diagnosis Date   Arthritis    Cataract    Dysrhythmia    palpitations   History of hiatal hernia    Hx of colonic polyps    Hypercholesterolemia    Lumbar back pain    Neck pain    Osteoporosis    Swelling of ankle 01/12/2016   bilateral. Pt is taking lasix x 2 weeks and wearing compression stockings    Patient Active Problem List   Diagnosis Date Noted   Acute blood loss anemia 08/09/2021   Encounter for removal of sutures 08/09/2021   Fall on same level from slipping, tripping or stumbling 08/09/2021   Closed left hip fracture, initial encounter (HCC) 01/27/2021   Scalp laceration    Open wound of lower leg 04/21/2020   Unilateral inguinal hernia, without obstruction or gangrene, not specified as recurrent 04/21/2020   Right lower quadrant pain 01/03/2020   Adjustment disorder 02/06/2018   Disorder of soft tissue 02/06/2018   Peripheral venous insufficiency 12/22/2015   Encounter for screening for other disorder 10/26/2015   Other specified diseases of blood and blood-forming organs 10/26/2015   Localized edema 05/01/2015   Allergic rhinitis 02/08/2015   Dermatitis 12/05/2014   Herpes zoster without complication 09/08/2014   Age-related osteoporosis without current pathological fracture 09/05/2014   Hyperlipidemia 09/05/2014   Postmenopausal  state 09/05/2014   Palpitations 12/04/2012   Chest pain 11/20/2012   DJD (degenerative joint disease) 11/22/2011   NECK PAIN 03/01/2008   BACK PAIN, LUMBAR 03/01/2008   COLONIC POLYPS 02/29/2008   HYPERCHOLESTEROLEMIA 02/29/2008   Osteoporosis 02/29/2008    Past Surgical History:  Procedure Laterality Date   ABDOMINAL HYSTERECTOMY  12/2003   DR. Neal--bso, A/P repair   cataract surgery Bilateral    COLONOSCOPY     HIP ARTHROPLASTY Left 01/28/2021   Procedure: ARTHROPLASTY BIPOLAR HIP (HEMIARTHROPLASTY);  Surgeon: Adah Acron, MD;  Location: [facility];  Service: Orthopedics;  Laterality: Left;  depuy, Lateral position with Mark II   INGUINAL HERNIA REPAIR Bilateral 07/07/2023   Procedure: LAPAROSCOPIC BILATERAL INGUINAL HERNIA REPAIR WITH MESH;  Surgeon: Stechschulte, Avon Boers, MD;  Location: [facility];  Service: General;  Laterality: Bilateral;   LUMBAR LAMINECTOMY  1985   Dr. Dante Dyer   POLYPECTOMY     surgery for removal of rectal polyp  07/1995   Dr. Ilah Males   TUBAL LIGATION      OB History   No obstetric history on file.      Home Medications    Prior to Admission medications   Medication Sig Start Date End Date Taking? Authorizing Provider  cefUROXime (CEFTIN) 250 MG tablet Take 1 tablet (250 mg total) by mouth 2 (two) times daily with a  meal. 03/23/24  Yes Justine Cossin K, MD  mupirocin  ointment (BACTROBAN ) 2 % Apply 1 Application topically 2 (two) times daily. To affected area till better 03/23/24  Yes Masiyah Engen K, MD  Ascorbic Acid (VITAMIN C PO) Take 1 tablet by mouth in the morning and at bedtime. Gummies    [provider]  BD PEN NEEDLE NANO 2ND GEN 32G X 4 MM MISC SMARTSIG:Pre-Filled Pen Syringe SUB-Q Daily 05/23/21   [provider]  Calcium  Carbonate-Vitamin D  (CALCIUM -D PO) Take 1 tablet by mouth in the morning and at bedtime.    [provider]  Cholecalciferol (VITAMIN D3 PO) Take 1 tablet by mouth in the morning and at bedtime.     [provider]  denosumab  (PROLIA ) 60 MG/ML SOSY injection Inject 60 mg into the skin every 6 (six) months. 11/23/14   [provider]  ELDERBERRY-VITAMIN C-ZINC PO Take 1 tablet by mouth in the morning and at bedtime. Gummies    [provider]  ezetimibe  (ZETIA ) 10 MG tablet TAKE 1 TABLET DAILY 12/17/23   Arnoldo Lapping, MD  losartan  (COZAAR ) 50 MG tablet Take 1 tablet (50 mg total) by mouth daily. 01/09/24   Arnoldo Lapping, MD  Probiotic Product (ALIGN) CHEW Chew 1 tablet by mouth in the morning and at bedtime. Gummies    [provider]  rosuvastatin (CRESTOR) 5 MG tablet Take 5 mg by mouth daily. 08/06/21   [provider]    Family History Family History  Problem Relation Age of Onset   Stroke Mother    Heart Problems Sister        triple bipass   Heart Problems Brother        blocked arteries, stent.   Stomach cancer Neg Hx    Colon cancer Neg Hx    Pancreatic cancer Neg Hx    Rectal cancer Neg Hx    Colon polyps Neg Hx    Esophageal cancer Neg Hx     Social History Social History   Tobacco Use   Smoking status: Former    Current packs/day: 0.00    Average packs/day: 0.3 packs/day for 1 year (0.3 ttl pk-yrs)    Types: Cigarettes    Start date: 11/11/1953    Quit date: 11/11/1954    Years since quitting: 69.4    Passive exposure: Never   Smokeless tobacco: Never  Vaping Use   Vaping status: Never Used  Substance Use Topics   Alcohol  use: Not Currently   Drug use: No     Allergies   Risedronate sodium, Amlodipine , Cephalexin , Oxycodone , and Hydrocodone    Review of Systems Review of Systems   Physical Exam Triage Vital Signs ED Triage Vitals [03/23/24 0842]  Encounter Vitals Group     BP (!) 159/82     Systolic BP Percentile      Diastolic BP Percentile      Pulse Rate 69     Resp 18     Temp 98 F (36.7 C)     Temp Source Oral     SpO2 96 %     Weight      Height      Head Circumference      Peak Flow       Pain Score 0     Pain Loc      Pain Education      Exclude from Growth Chart    No data found.  Updated Vital Signs BP (!) 159/82 (BP  Location: Left Arm)   Pulse 69   Temp 98 F (36.7 C) (Oral)   Resp 18   SpO2 96%   Visual Acuity Right Eye Distance:   Left Eye Distance:   Bilateral Distance:    Right Eye Near:   Left Eye Near:    Bilateral Near:     Physical Exam Vitals reviewed.  Constitutional:      General: She is not in acute distress.    Appearance: She is not ill-appearing, toxic-appearing or diaphoretic.  Skin:    Coloration: Skin is not jaundiced or pale.     Comments: There is a wound on her left shin about midway between the knee and ankle.  It is about 4 cm x 2-1/2 cm.  The wound bed is healthy with good granulation tissue.  The edges of the wound are not elevated, but there is some pink erythema and induration in the surrounding tissues of her lower leg.  That erythema extends about 10 cm in total width.   Neurological:     Mental Status: She is alert and oriented to person, place, and time.  Psychiatric:        Behavior: Behavior normal.      UC Treatments / Results  Labs (all labs ordered are listed, but only abnormal results are displayed) Labs Reviewed - No data to display  EKG   Radiology No results found.  Procedures Procedures (including critical care time)  Medications Ordered in UC Medications - No data to display  Initial Impression / Assessment and Plan / UC Course  I have reviewed the triage vital signs and the nursing notes.  Pertinent labs & imaging results that were available during my care of the patient were reviewed by me and considered in my medical decision making (see chart for details).     I was going to prescribe Keflex , as the patient indicated she did not have any allergies, but apparently Keflex  has given her side effects in the past, according to the chart.  Ceftin is sent in for another 7 days for what  appears to be some cellulitis.  She will continue mupirocin  and a new prescription for that is sent in.  She is given contact information for wound care and I have asked her to follow-up with her primary care. Final Clinical Impressions(s) / UC Diagnoses   Final diagnoses:  Wound infection     Discharge Instructions      Put mupirocin  ointment on the sore area twice daily until improved  Cefuroxime 250 mg--1 tablet 2 times daily with a meal.   Please follow-up with your primary care about this issue  Seeing wound care could help this heal easier.   ED Prescriptions     Medication Sig Dispense Auth. Provider   cefUROXime (CEFTIN) 250 MG tablet Take 1 tablet (250 mg total) by mouth 2 (two) times daily with a meal. 14 tablet Morgana Rowley K, MD   mupirocin  ointment (BACTROBAN ) 2 % Apply 1 Application topically 2 (two) times daily. To affected area till better 22 g Ellsworth Haas Paige Boatman, MD      PDMP not reviewed this encounter.   Ann Keto, MD 03/23/24 [phone]

## 2024-03-23 NOTE — ED Triage Notes (Signed)
 Pt here for wound check to LLE. States seen here 3 wks ago. Denies pain or drainage. States still little red. States cleans and changes dressing twice a day and almost out of her bacitracin.

## 2024-03-25 DIAGNOSIS — M810 Age-related osteoporosis without current pathological fracture: Secondary | ICD-10-CM | POA: Diagnosis not present

## 2024-03-31 ENCOUNTER — Other Ambulatory Visit (HOSPITAL_COMMUNITY): Payer: Self-pay | Admitting: *Deleted

## 2024-04-01 ENCOUNTER — Ambulatory Visit (HOSPITAL_COMMUNITY)
Admission: RE | Admit: 2024-04-01 | Discharge: 2024-04-01 | Disposition: A | Discharge status: Home / Self Care | Source: Ambulatory Visit | Attending: Internal Medicine | Admitting: Internal Medicine

## 2024-04-01 DIAGNOSIS — M810 Age-related osteoporosis without current pathological fracture: Secondary | ICD-10-CM | POA: Insufficient documentation

## 2024-04-01 MED ORDER — DENOSUMAB 60 MG/ML ~~LOC~~ SOSY
60.0000 mg | PREFILLED_SYRINGE | Freq: Once | SUBCUTANEOUS | Status: AC
  Administered 2024-04-01: 60 mg via SUBCUTANEOUS

## 2024-04-01 MED ORDER — DENOSUMAB 60 MG/ML ~~LOC~~ SOSY
PREFILLED_SYRINGE | SUBCUTANEOUS | Status: AC
  Filled 2024-04-01: qty 1

## 2024-04-02 DIAGNOSIS — I10 Essential (primary) hypertension: Secondary | ICD-10-CM | POA: Diagnosis not present

## 2024-04-02 DIAGNOSIS — I872 Venous insufficiency (chronic) (peripheral): Secondary | ICD-10-CM | POA: Diagnosis not present

## 2024-04-02 DIAGNOSIS — M810 Age-related osteoporosis without current pathological fracture: Secondary | ICD-10-CM | POA: Diagnosis not present

## 2024-04-02 DIAGNOSIS — L97921 Non-pressure chronic ulcer of unspecified part of left lower leg limited to breakdown of skin: Secondary | ICD-10-CM | POA: Diagnosis not present

## 2024-04-15 ENCOUNTER — Ambulatory Visit (INDEPENDENT_AMBULATORY_CARE_PROVIDER_SITE_OTHER): Admitting: Podiatry

## 2024-04-15 ENCOUNTER — Encounter: Payer: Self-pay | Admitting: Podiatry

## 2024-04-15 VITALS — Ht 59.0 in | Wt 100.0 lb

## 2024-04-15 DIAGNOSIS — M2042 Other hammer toe(s) (acquired), left foot: Secondary | ICD-10-CM | POA: Diagnosis not present

## 2024-04-15 NOTE — Progress Notes (Signed)
 Subjective:   Patient ID: [name], female   DOB: 89 y.o.   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  If symptoms persist we will have to consider another option  X-rays indicate significant enlargement around the second digit left with dorsal medial dislocation

## 2024-05-11 ENCOUNTER — Ambulatory Visit (HOSPITAL_BASED_OUTPATIENT_CLINIC_OR_DEPARTMENT_OTHER): Admitting: Internal Medicine

## 2024-06-01 DIAGNOSIS — Z9849 Cataract extraction status, unspecified eye: Secondary | ICD-10-CM | POA: Diagnosis not present

## 2024-06-01 DIAGNOSIS — H3540 Unspecified peripheral retinal degeneration: Secondary | ICD-10-CM | POA: Diagnosis not present

## 2024-06-01 DIAGNOSIS — H53143 Visual discomfort, bilateral: Secondary | ICD-10-CM | POA: Diagnosis not present

## 2024-06-01 DIAGNOSIS — H43393 Other vitreous opacities, bilateral: Secondary | ICD-10-CM | POA: Diagnosis not present

## 2024-06-08 DIAGNOSIS — Z85828 Personal history of other malignant neoplasm of skin: Secondary | ICD-10-CM | POA: Diagnosis not present

## 2024-06-08 DIAGNOSIS — D485 Neoplasm of uncertain behavior of skin: Secondary | ICD-10-CM | POA: Diagnosis not present

## 2024-06-08 DIAGNOSIS — D0462 Carcinoma in situ of skin of left upper limb, including shoulder: Secondary | ICD-10-CM | POA: Diagnosis not present

## 2024-06-08 DIAGNOSIS — L820 Inflamed seborrheic keratosis: Secondary | ICD-10-CM | POA: Diagnosis not present

## 2024-06-08 DIAGNOSIS — L821 Other seborrheic keratosis: Secondary | ICD-10-CM | POA: Diagnosis not present

## 2024-09-10 DIAGNOSIS — Z23 Encounter for immunization: Secondary | ICD-10-CM | POA: Diagnosis not present

## 2024-09-28 DIAGNOSIS — Z85828 Personal history of other malignant neoplasm of skin: Secondary | ICD-10-CM | POA: Diagnosis not present

## 2024-09-28 DIAGNOSIS — L570 Actinic keratosis: Secondary | ICD-10-CM | POA: Diagnosis not present

## 2024-09-28 DIAGNOSIS — L820 Inflamed seborrheic keratosis: Secondary | ICD-10-CM | POA: Diagnosis not present

## 2024-09-28 DIAGNOSIS — D485 Neoplasm of uncertain behavior of skin: Secondary | ICD-10-CM | POA: Diagnosis not present

## 2024-10-21 DIAGNOSIS — E785 Hyperlipidemia, unspecified: Secondary | ICD-10-CM | POA: Diagnosis not present

## 2024-11-01 ENCOUNTER — Other Ambulatory Visit (HOSPITAL_COMMUNITY): Payer: Self-pay | Admitting: Internal Medicine

## 2024-11-05 ENCOUNTER — Telehealth: Payer: Self-pay

## 2024-11-05 NOTE — Telephone Encounter (Signed)
 Auth Submission: NO AUTH NEEDED Site of care: Site of care: [facility] Payer: Medicare a/b, [employer] Medication & CPT/J Code(s) submitted: Prolia  (Denosumab ) [id number] Diagnosis Code:  Route of submission (phone, fax, portal): portal Phone # Fax # Auth type: Buy/Bill HB Units/visits requested: 60mg  q34months x 2doses Reference number:  Approval from: 11/05/24 to 12/11/24

## 2024-11-10 ENCOUNTER — Ambulatory Visit (HOSPITAL_COMMUNITY)
Admission: RE | Admit: 2024-11-10 | Discharge: 2024-11-10 | Disposition: A | Discharge status: Home / Self Care | Source: Ambulatory Visit | Attending: Internal Medicine | Admitting: Internal Medicine

## 2024-11-10 VITALS — BP 146/66 | HR 67 | Temp 97.6°F | Resp 18

## 2024-11-10 DIAGNOSIS — M810 Age-related osteoporosis without current pathological fracture: Secondary | ICD-10-CM | POA: Diagnosis present

## 2024-11-10 MED ORDER — DENOSUMAB 60 MG/ML ~~LOC~~ SOSY
PREFILLED_SYRINGE | SUBCUTANEOUS | Status: AC
  Filled 2024-11-10: qty 1

## 2024-11-10 MED ORDER — DENOSUMAB 60 MG/ML ~~LOC~~ SOSY
60.0000 mg | PREFILLED_SYRINGE | Freq: Once | SUBCUTANEOUS | Status: AC
  Administered 2024-11-10: 60 mg via SUBCUTANEOUS

## 2024-11-18 ENCOUNTER — Encounter: Payer: Self-pay | Admitting: Cardiovascular Disease

## 2024-12-08 ENCOUNTER — Ambulatory Visit: Admitting: Physician Assistant

## 2024-12-09 ENCOUNTER — Other Ambulatory Visit: Payer: Self-pay | Admitting: Cardiovascular Disease

## 2025-01-19 ENCOUNTER — Ambulatory Visit: Admitting: Emergency Medicine

## 2025-05-12 ENCOUNTER — Encounter (HOSPITAL_COMMUNITY)
